# Patient Record
Sex: Male | Born: 1965 | Race: White | Hispanic: No | Marital: Married | Smoking: Never smoker
Health system: Southern US, Community
[De-identification: ages and names within clinical notes are randomized; demographics above are authoritative.]

## PROBLEM LIST (undated history)

## (undated) DIAGNOSIS — I82409 Acute embolism and thrombosis of unspecified deep veins of unspecified lower extremity: Secondary | ICD-10-CM

## (undated) DIAGNOSIS — E785 Hyperlipidemia, unspecified: Secondary | ICD-10-CM

## (undated) DIAGNOSIS — K449 Diaphragmatic hernia without obstruction or gangrene: Secondary | ICD-10-CM

## (undated) DIAGNOSIS — Z21 Asymptomatic human immunodeficiency virus [HIV] infection status: Secondary | ICD-10-CM

## (undated) DIAGNOSIS — K219 Gastro-esophageal reflux disease without esophagitis: Secondary | ICD-10-CM

## (undated) DIAGNOSIS — N200 Calculus of kidney: Secondary | ICD-10-CM

## (undated) HISTORY — DX: Diaphragmatic hernia without obstruction or gangrene: K44.9

## (undated) HISTORY — DX: Asymptomatic human immunodeficiency virus (hiv) infection status: Z21

## (undated) HISTORY — DX: Gilbert syndrome: E80.4

## (undated) HISTORY — PX: UPPER GASTROINTESTINAL ENDOSCOPY: SHX188

## (undated) HISTORY — PX: APPENDECTOMY: SHX54

## (undated) HISTORY — DX: Hyperlipidemia, unspecified: E78.5

## (undated) HISTORY — DX: Calculus of kidney: N20.0

## (undated) HISTORY — PX: COLONOSCOPY: SHX174

## (undated) HISTORY — PX: ARTHROPLASTY: SHX135

## (undated) HISTORY — DX: Gastro-esophageal reflux disease without esophagitis: K21.9

## (undated) HISTORY — DX: Acute embolism and thrombosis of unspecified deep veins of unspecified lower extremity: I82.409

---

## 1990-02-27 DIAGNOSIS — Z21 Asymptomatic human immunodeficiency virus [HIV] infection status: Secondary | ICD-10-CM

## 1990-02-27 HISTORY — DX: Asymptomatic human immunodeficiency virus (hiv) infection status: Z21

## 1998-10-30 ENCOUNTER — Emergency Department (HOSPITAL_COMMUNITY): Admission: EM | Admit: 1998-10-30 | Discharge: 1998-10-30 | Payer: Self-pay

## 1998-10-31 ENCOUNTER — Emergency Department (HOSPITAL_COMMUNITY): Admission: EM | Admit: 1998-10-31 | Discharge: 1998-10-31 | Payer: Self-pay

## 2000-05-31 ENCOUNTER — Encounter: Payer: Self-pay | Admitting: Family Medicine

## 2000-05-31 ENCOUNTER — Encounter: Admission: RE | Admit: 2000-05-31 | Discharge: 2000-05-31 | Payer: Self-pay | Admitting: Family Medicine

## 2000-08-22 ENCOUNTER — Encounter (INDEPENDENT_AMBULATORY_CARE_PROVIDER_SITE_OTHER): Payer: Self-pay | Admitting: Specialist

## 2000-08-22 ENCOUNTER — Ambulatory Visit (HOSPITAL_COMMUNITY): Admission: RE | Admit: 2000-08-22 | Discharge: 2000-08-22 | Payer: Self-pay | Admitting: Gastroenterology

## 2002-10-24 ENCOUNTER — Emergency Department (HOSPITAL_COMMUNITY): Admission: EM | Admit: 2002-10-24 | Discharge: 2002-10-24 | Payer: Self-pay | Admitting: Emergency Medicine

## 2002-10-24 ENCOUNTER — Encounter: Payer: Self-pay | Admitting: Emergency Medicine

## 2003-11-12 ENCOUNTER — Encounter: Admission: RE | Admit: 2003-11-12 | Discharge: 2003-11-12 | Payer: Self-pay | Admitting: Family Medicine

## 2004-10-04 ENCOUNTER — Emergency Department (HOSPITAL_COMMUNITY): Admission: EM | Admit: 2004-10-04 | Discharge: 2004-10-04 | Payer: Self-pay | Admitting: Family Medicine

## 2005-02-19 ENCOUNTER — Emergency Department (HOSPITAL_COMMUNITY): Admission: EM | Admit: 2005-02-19 | Discharge: 2005-02-19 | Payer: Self-pay | Admitting: Family Medicine

## 2005-08-18 ENCOUNTER — Ambulatory Visit: Payer: Self-pay | Admitting: Family Medicine

## 2005-11-23 ENCOUNTER — Ambulatory Visit: Payer: Self-pay | Admitting: Family Medicine

## 2006-06-01 ENCOUNTER — Ambulatory Visit: Payer: Self-pay | Admitting: Family Medicine

## 2006-12-25 ENCOUNTER — Ambulatory Visit: Payer: Self-pay | Admitting: Family Medicine

## 2007-01-02 ENCOUNTER — Ambulatory Visit: Payer: Self-pay | Admitting: Family Medicine

## 2007-01-23 ENCOUNTER — Emergency Department (HOSPITAL_COMMUNITY): Admission: EM | Admit: 2007-01-23 | Discharge: 2007-01-23 | Payer: Self-pay | Admitting: Family Medicine

## 2007-09-05 ENCOUNTER — Ambulatory Visit: Payer: Self-pay | Admitting: Family Medicine

## 2008-06-29 ENCOUNTER — Ambulatory Visit: Payer: Self-pay | Admitting: Family Medicine

## 2008-10-04 ENCOUNTER — Emergency Department (HOSPITAL_COMMUNITY): Admission: EM | Admit: 2008-10-04 | Discharge: 2008-10-04 | Payer: Self-pay | Admitting: Emergency Medicine

## 2009-02-22 ENCOUNTER — Emergency Department (HOSPITAL_COMMUNITY): Admission: EM | Admit: 2009-02-22 | Discharge: 2009-02-22 | Payer: Self-pay | Admitting: Emergency Medicine

## 2009-04-19 ENCOUNTER — Ambulatory Visit: Payer: Self-pay | Admitting: Family Medicine

## 2009-05-17 ENCOUNTER — Encounter: Admission: RE | Admit: 2009-05-17 | Discharge: 2009-05-17 | Payer: Self-pay | Admitting: Family Medicine

## 2009-05-17 ENCOUNTER — Ambulatory Visit: Payer: Self-pay | Admitting: Family Medicine

## 2009-05-19 ENCOUNTER — Ambulatory Visit: Payer: Self-pay | Admitting: Sports Medicine

## 2009-05-19 DIAGNOSIS — M79609 Pain in unspecified limb: Secondary | ICD-10-CM | POA: Insufficient documentation

## 2009-05-27 ENCOUNTER — Ambulatory Visit (HOSPITAL_COMMUNITY): Admission: RE | Admit: 2009-05-27 | Discharge: 2009-05-27 | Payer: Self-pay | Admitting: Family Medicine

## 2009-06-09 ENCOUNTER — Ambulatory Visit: Payer: Self-pay | Admitting: Sports Medicine

## 2009-06-09 DIAGNOSIS — M84369A Stress fracture, unspecified tibia and fibula, initial encounter for fracture: Secondary | ICD-10-CM | POA: Insufficient documentation

## 2009-07-06 ENCOUNTER — Ambulatory Visit: Payer: Self-pay | Admitting: Sports Medicine

## 2009-07-27 ENCOUNTER — Ambulatory Visit: Payer: Self-pay | Admitting: Sports Medicine

## 2009-08-24 ENCOUNTER — Ambulatory Visit: Payer: Self-pay | Admitting: Sports Medicine

## 2010-03-20 ENCOUNTER — Encounter: Payer: Self-pay | Admitting: Family Medicine

## 2010-03-22 ENCOUNTER — Ambulatory Visit
Admission: RE | Admit: 2010-03-22 | Discharge: 2010-03-22 | Payer: Self-pay | Source: Home / Self Care | Attending: Family Medicine | Admitting: Family Medicine

## 2010-03-28 ENCOUNTER — Ambulatory Visit (HOSPITAL_COMMUNITY)
Admission: RE | Admit: 2010-03-28 | Discharge: 2010-03-28 | Payer: Self-pay | Source: Home / Self Care | Attending: Family Medicine | Admitting: Family Medicine

## 2010-03-31 NOTE — Assessment & Plan Note (Signed)
Summary: FU LEG PAIN/MJD   Vital Signs:  Patient profile:   45 year old male BP sitting:   135 / 87  Vitals Entered By: Lillia Pauls CMA (Jul 06, 2009 3:07 PM)  History of Present Illness: 45 yo M here for 4 week f/u of R tibial stress fracture  Patient reports only feeling a bruise-like sensation at area of tibia after a long walk No swelling and no true pain Has been able to walk 3 miles without limping. Is compliant with wearing his aircast on ambulation. Is following tibial stress fracture protocol Biking up to 45 minutes at a time Not needing any medications Has not tried jogging yet Ices after exercise.  Allergies (verified): No Known Drug Allergies  Physical Exam  General:  Well-developed,well-nourished,in no acute distress; alert,appropriate and cooperative throughout examination. Msk:  R lower leg: No gross deformity, swelling, or bruising. No TTP throughout tibia. Negative load, fulcrum, and hop tests. FROM ankle and knee. NV intact distally.   Impression & Recommendations:  Problem # 1:  STRESS FRACTURE, TIBIA (ICD-733.93) Assessment Improved Given patient has negative hop test and no pain on exam, will start him in jogging part of protocol slowly over next 3 weeks.  To back down a step if pain worsens with this.  Continue with icing and biking on off days.  F/u in 3 weeks for reevaluation.  Problem # 2:  LEG PAIN, RIGHT (ICD-729.5) Assessment: Improved 2/2 stress fracture.  Follow protocol as noted.  Patient Instructions: 1)  Week 1: Every other day jog/walk 400 meters/400 meters (1/4 of a mile each) on soft track if possible otherwise ok on a treadmill x 3 sessions. 2)  Week 2: Next 3 sessions jog/walk 500 meters/339meters, 600 meters/200 meters, and finally 700 meters/100 meters. 3)  Week 3: Then jog 1 mile every other day for the third 4)  Bike 45 minutes on alternate days 5)  Continue with icing 6)  Continue with weight exercises. 7)  Make sure  you continue to wear the aircast when you are up walking around and definitely with exercise. 8)  Follow up with [place] in 3 weeks.

## 2010-03-31 NOTE — Assessment & Plan Note (Signed)
Summary: F/U,MC   History of Present Illness: 45 yo M here for 4 week f/u of R tibial stress fracture  He initially had symptoms dating back to early march - pain anterior right tibia at mid-distal 1/3rd. Had been following protocol and progressed to jogging about 3 miles every other day with aircast Approximately 2 weeks ago during a run developed a bruise sensation in same area he had pain No swelling or bruising Since then has completely stopped running - biking and walkign without pain Able to walk up to 3 miles a day with dog and bike 13 miles Occasionally icing. This was first year he has tried to run and with the above happening he is going to stop running for the year and go back to his weight training, biking, walking only. No pain at rest.  Allergies (verified): No Known Drug Allergies  Physical Exam  General:  Well-developed,well-nourished,in no acute distress; alert,appropriate and cooperative throughout examination. Msk:  R lower leg: No gross deformity, swelling, or bruising. No TTP throughout tibia. Negative load, fulcrum, and hop tests. FROM ankle and knee. NV intact distally.   Impression & Recommendations:  Problem # 1:  STRESS FRACTURE, TIBIA (ICD-733.93) Assessment Deteriorated Patient will continue with biking and walking the dog, weight training.  Discussed coming out of the aircast but only doing his biking and walking at 50% his normal workouts to ensure he does not get pain with this before increasing by 0.5 miles per session.  Given he is about 3 months out and these put less stress on tibia than running, he should do well with this.  F/u with [place] in 8 weeks or as needed if he is not having issues.  Call for any concerns.

## 2010-03-31 NOTE — Assessment & Plan Note (Signed)
Summary: f/u,mc   Vital Signs:  Patient profile:   45 year old male BP sitting:   143 / 81  Vitals Entered By: Lillia Pauls CMA (June 09, 2009 1:39 PM)  History of Present Illness: Reports to f/u right tib stress fracture. Using air cast for ambulatory purposes.  No running of high impact LE activity since his LOV. No paresthesias. Tripped over his dog yesterday, causing him to land on both feet. Felt some mild med right tib pain which quickly resolved. No swelling.  Allergies: No Known Drug Allergies  Physical Exam  General:  Well-developed,well-nourished,in no acute distress; alert,appropriate and cooperative throughout examination Msk:  Unchanged from last examination with exception of significantly decreased right tib ttp. Normal gait.   Impression & Recommendations:  Problem # 1:  STRESS FRACTURE, TIBIA (ICD-733.93) Assessment Improved  - Will start tibial stress fracture protocol, beginning with staionary cycling. Respective handout provided to the patient. Cautioned to use pain as his guide. - Continue to use aircast for ambulatory activities. - Patient to RTC in 4 wks. Call us sooner as needed for any questions, pain, or other concerns.

## 2010-03-31 NOTE — Assessment & Plan Note (Signed)
Summary: F/U,MC   Vital Signs:  Patient profile:   45 year old male BP sitting:   125 / 73  Vitals Entered By: Lillia Pauls CMA (Jul 27, 2009 3:06 PM)  History of Present Illness: 45 yo M here for 3 week f/u of R tibial stress fracture  Patient no longer has any pain. No swelling or bruising. Up to jog/walk 2+ miles without increase in pain - jogs about 75% of the time. Is compliant with wearing his aircast on ambulation. Is following tibial stress fracture protocol provided first OV. Biking up to 45 minutes at a time Not needing any medications Ices after exercise.  Allergies (verified): No Known Drug Allergies  Physical Exam  General:  Well-developed,well-nourished,in no acute distress; alert,appropriate and cooperative throughout examination. Msk:  R lower leg: No gross deformity, swelling, or bruising. No TTP throughout tibia. Negative load, fulcrum, and hop tests. FROM ankle and knee. NV intact distally.   Impression & Recommendations:  Problem # 1:  STRESS FRACTURE, TIBIA (ICD-733.93) Assessment Improved Continue with stress fracture protocol.  See instructions.  F/u in 4 weeks.  Problem # 2:  LEG PAIN, RIGHT (ICD-729.5) Assessment: Improved  Patient Instructions: 1)  Continuous jogging with aircast 2-2.5 miles every other day next week. 2)  Then continue with protocol - alternate with and without aircast for jogs. 3)  Biking on off days 4)  Ok to go without aircast around house and on level terrain unless sore. 5)  Icing as needed. 6)  Follow up in 4 weeks.

## 2010-03-31 NOTE — Assessment & Plan Note (Signed)
Summary: 3:15- R TIBIA PAIN PER [facility],MC   Vital Signs:  Patient profile:   45 year old male Height:      69 inches Weight:      180 pounds BMI:     26.68 BP sitting:   100 / 70  Vitals Entered By: Lillia Pauls CMA (May 19, 2009 3:17 PM)  History of Present Illness: Started marathon training in 02/2009. Started running 13 miles weekly during this time. Developed bilatera mid shin pain  ~2.5 wks ago after a 5 mile run. Noted bilateral shin pain during 8 mile mark of next run -- this notably localized to the right shin. Did not complete this run. No running in interim. Reproduced pain on ambulation.  No prior stress fracture or procedures. No paresthesias.  Marathon scheduled for 08/2009.  Allergies (verified): No Known Drug Allergies PMH-FH-SH reviewed for relevance  Physical Exam  General:  Well-developed,well-nourished,in no acute distress; alert,appropriate and cooperative throughout examination. Msk:  TIBIAS/FIBULAS: Mid shaft ttp extending toward malleolus on right. Some posterior tib ttp on right as well.  No swelling, discoloration, or defect. (+) right hop test.  HIPS: FROM. Decreased abd/ER strength on right.  KNEES/ANKLES/FEET: Full ROM/strength. Bilateral pes planus with excessive pronation. No ttp.   Additional Exam:  Musculoskeletal Ultrasound: Longitudinal and transverse views of the right tibia revealed the following:  Hypoechogenic area above area of max ttp suggestive of fluid collection. No apparent cortical thickening. Borderline increased doppler flow wrt to area of ttp.   Impression & Recommendations:  Problem # 1:  LEG PAIN, RIGHT (ICD-729.5) Likely stress fracture  - No running or lower extremity exercise. - Bone scan to r/o stress fracture. - Long Air cast for RLE. - RTC in 2 weeks.  Orders: [place] LIMITED (13086) Aircast Leg brace (V7846) T-Bone scan 3 phase study (96295)

## 2010-04-05 ENCOUNTER — Ambulatory Visit (INDEPENDENT_AMBULATORY_CARE_PROVIDER_SITE_OTHER): Payer: Commercial Managed Care - PPO

## 2010-04-05 DIAGNOSIS — Z79899 Other long term (current) drug therapy: Secondary | ICD-10-CM

## 2010-05-17 ENCOUNTER — Ambulatory Visit (INDEPENDENT_AMBULATORY_CARE_PROVIDER_SITE_OTHER): Payer: 59 | Admitting: Family Medicine

## 2010-05-17 DIAGNOSIS — Z79899 Other long term (current) drug therapy: Secondary | ICD-10-CM

## 2010-05-17 DIAGNOSIS — B20 Human immunodeficiency virus [HIV] disease: Secondary | ICD-10-CM

## 2010-06-05 LAB — POCT URINALYSIS DIP (DEVICE)
Bilirubin Urine: NEGATIVE
Glucose, UA: NEGATIVE mg/dL
Hgb urine dipstick: NEGATIVE
Nitrite: NEGATIVE
Urobilinogen, UA: 0.2 mg/dL (ref 0.0–1.0)

## 2010-07-11 ENCOUNTER — Other Ambulatory Visit: Payer: Self-pay | Admitting: Family Medicine

## 2010-07-15 NOTE — Procedures (Signed)
[facility]  Patient:    Darryl Black, Darryl Black                      MRN: [id number] Proc. Date: 08/22/00 Attending:  Verlin Grills, M.D. CC:         Ronnald Nian, M.D.  Fransisco Hertz, M.D.   Procedure Report  PROCEDURE PERFORMED:  Esophagogastroduodenoscopy, small bowel biopsies, colonoscopy, ileal biopsies and colonic biopsies.  DATE OF BIRTH:  04-Jun-1965  REFERRING PHYSICIAN:  Ronnald Nian, M.D.  ENDOSCOPIST:  Verlin Grills, M.D.  INDICATIONS FOR PROCEDURE:  The patient is a 45 year old male who is HIV positive.  He presents with chronic alternating nonbloody diarrhea with constipation and progressive weight loss despite an adequate oral intake by his estimation.  His odynophagia has resolved.  His last CD4 count was 80.  A complete evaluation of his stool ordered by Dr. Sharlot Gowda was negative for bacterial or parasitic infection.  MEDICATION ALLERGIES:  None.  CHRONIC MEDICATIONS:  Zerit, Videx, Susteva, Septra, multivitamin, Viramune, Vactivan, Levaquin, Humibid.  PAST MEDICAL HISTORY:  HIV positive, lipodystrophy, appendectomy.  PREMEDICATION:  Versed 10 mg, Demerol 50 mg.  ENDOSCOPE:  Olympus video gastroscope and pediatric colonoscope.  DESCRIPTION OF PROCEDURE:   Esophagogastroduodenoscopy with small bowel biopsies.  After obtaining informed consent, the patient was placed in the left lateral decubitus position.  I administered intravenous Demerol and intravenous Versed to achieve conscious sedation for the procedure.  The patients blood pressure, oxygen saturation and cardiac rhythm were monitored throughout the procedure and documented in the medical record.  The Olympus gastroscope was passed through the posterior hypopharynx into the proximal esophagus without difficulty.  The hypopharynx, larynx and vocal cords appeared normal.  Esophagoscopy:  The proximal, mid and lower segments of the esophagus  appeared normal.  Gastroscopy:  Retroflex view of the gastric cardia and fundus was normal.  The gastric body, antrum and pylorus appeared normal.  Duodenoscopy:  The duodenal bulb, midduodenum and distal duodenum appeared normal.  Six biopsies were taken from the second/third portions of the duodenum.  ASSESSMENT:  Normal esophagogastroduodenoscopy.  Small bowel biopsies have been sent to pathology to look for Blastocystis, Isospora, Cryptosporidia, and Microsporidia.  DESCRIPTION OF PROCEDURE:  Proctocolonoscopy to the cecum.  Anal inspection was normal.  Digital rectal exam was normal.  The Olympus pediatric video colonoscope was then introduced into the rectum and easily advanced to the cecum.  A normal-appearing ileocecal valve was intubated and the distal ileum inspected.  Colonic preparation for the exam today was excellent.  Rectum:  Normal.  Sigmoid colon and descending colon:  Normal.  Splenic flexure:  Normal.  Transverse colon:  Normal.  Hepatic flexure:  Normal.  Ascending colon:  Normal.  Cecum and ileocecal valve:  Normal.  Distal ileum:  Normal.  Five biopsies were taken from the distal ileum.  Three biopsies were taken from the right colon and three biopsies were taken from the descending colon; all colonic biopsies were submitted in one bottle for pathologic evaluation.  Large bowel pathogens would include Shigella, Campylobacter, amoeba, Cytomegalovirus and Herpes simplex virus.  ASSESSMENT:  Normal proctocolonoscopy to the cecum with intubation of the ileocecal valve and distal ileal inspection.  Ileal biopsies and colonic biopsies are pending. DD:  08/22/00 TD:  08/22/00 Job: [id number]

## 2011-04-11 ENCOUNTER — Ambulatory Visit (INDEPENDENT_AMBULATORY_CARE_PROVIDER_SITE_OTHER): Payer: 59 | Admitting: Family Medicine

## 2011-04-11 ENCOUNTER — Encounter: Payer: Self-pay | Admitting: Family Medicine

## 2011-04-11 VITALS — BP 120/82 | HR 52 | Ht 68.0 in | Wt 185.0 lb

## 2011-04-11 DIAGNOSIS — B20 Human immunodeficiency virus [HIV] disease: Secondary | ICD-10-CM

## 2011-04-11 DIAGNOSIS — E781 Pure hyperglyceridemia: Secondary | ICD-10-CM

## 2011-04-11 DIAGNOSIS — E785 Hyperlipidemia, unspecified: Secondary | ICD-10-CM | POA: Insufficient documentation

## 2011-04-11 DIAGNOSIS — Z0000 Encounter for general adult medical examination without abnormal findings: Secondary | ICD-10-CM

## 2011-04-11 LAB — POC HEMOCCULT BLD/STL (OFFICE/1-CARD/DIAGNOSTIC): Fecal Occult Blood, POC: NEGATIVE

## 2011-04-11 LAB — COMPREHENSIVE METABOLIC PANEL
Albumin: 5.2 g/dL (ref 3.5–5.2)
Alkaline Phosphatase: 51 U/L (ref 39–117)
BUN: 18 mg/dL (ref 6–23)
CO2: 27 mEq/L (ref 19–32)
Calcium: 10.7 mg/dL — ABNORMAL HIGH (ref 8.4–10.5)
Chloride: 100 mEq/L (ref 96–112)
Glucose, Bld: 87 mg/dL (ref 70–99)
Potassium: 4 mEq/L (ref 3.5–5.3)
Sodium: 137 mEq/L (ref 135–145)
Total Protein: 8.2 g/dL (ref 6.0–8.3)

## 2011-04-11 LAB — LIPID PANEL
Cholesterol: 219 mg/dL — ABNORMAL HIGH (ref 0–200)
HDL: 42 mg/dL (ref >39)
LDL Cholesterol: 128 mg/dL — ABNORMAL HIGH (ref 0–99)
Triglycerides: 246 mg/dL — ABNORMAL HIGH (ref <150)

## 2011-04-11 MED ORDER — OMEGA-3-ACID ETHYL ESTERS 1 G PO CAPS: 1.0000 g | ORAL_CAPSULE | Freq: Two times a day (BID) | ORAL | Status: DC

## 2011-04-11 MED ORDER — RITONAVIR 100 MG PO CAPS: 100.0000 mg | ORAL_CAPSULE | ORAL | Status: DC

## 2011-04-11 MED ORDER — ATAZANAVIR SULFATE 300 MG PO CAPS: 300.0000 mg | ORAL_CAPSULE | Freq: Every day | ORAL | Status: DC

## 2011-04-11 MED ORDER — EMTRICITABINE-TENOFOVIR DF 200-300 MG PO TABS: 1.0000 | ORAL_TABLET | Freq: Every day | ORAL | Status: AC

## 2011-04-11 MED ORDER — FENOFIBRATE 160 MG PO TABS: 160.0000 mg | ORAL_TABLET | Freq: Every day | ORAL | Status: DC

## 2011-04-11 NOTE — Progress Notes (Signed)
  Subjective:    Patient ID: Darryl Black, male    DOB: 18-Feb-1966, 46 y.o.   MRN: [id number]  HPI He is here for complete examination. He has enjoyed excellent health. He continues on medications listed in the chart. His work is going well. His home life is stable. He does not smoke or drink. He does complain of decreased urinary stream but no hesitancy, dysuria, frequency. He has had no recent sexual activity.   Review of Systems Negative except as above    Objective:   Physical ExamBP 120/82  Pulse 52  Ht 5\' 8"  (1.727 m)  Wt 185 lb (83.915 kg)  BMI 28.13 kg/m2  General Appearance:    Alert, cooperative, no distress, appears stated age  Head:    Normocephalic, without obvious abnormality, atraumatic  Eyes:    PERRL, conjunctiva/corneas clear, EOM's intact, fundi    benign  Ears:    Normal TM's and external ear canals  Nose:   Nares normal, mucosa normal, no drainage or sinus   tenderness  Throat:   Lips, mucosa, and tongue normal; teeth and gums normal  Neck:   Supple, no lymphadenopathy;  thyroid:  no   enlargement/tenderness/nodules; no carotid   bruit or JVD  Back:    Spine nontender, no curvature, ROM normal, no CVA     tenderness  Lungs:     Clear to auscultation bilaterally without wheezes, rales or     ronchi; respirations unlabored  Chest Wall:    No tenderness or deformity   Heart:    Regular rate and rhythm, S1 and S2 normal, no murmur, rub   or gallop  Breast Exam:    No chest wall tenderness, masses or gynecomastia  Abdomen:     Soft, non-tender, nondistended, normoactive bowel sounds,    no masses, no hepatosplenomegaly  Genitalia:    Normal male external genitalia without lesions.  Testicles without masses.  No inguinal hernias.  Rectal:    Normal sphincter tone, no masses or tenderness; guaiac negative stool.  Prostate smooth, no nodules, slightly enlarged.  Extremities:   No clubbing, cyanosis or edema  Pulses:   2+ and symmetric all extremities  Skin:   Skin  color, texture, turgor normal, no rashes or lesions  Lymph nodes:   Cervical, supraclavicular, and axillary nodes normal  Neurologic:   CNII-XII intact, normal strength, sensation and gait; reflexes 2+ and symmetric throughout          Psych:   Normal mood, affect, hygiene and grooming.          Assessment & Plan:   1. Routine general medical examination at a health care facility  POCT Hemoccult (POC) Blood/Stool Test, Comprehensive metabolic panel, Lipid panel  2. HIV disease  T-helper cells (CD4) count, HIV 1 RNA quant-no reflex-bld  3. Hypertriglyceridemia     discussed his decreased stream. At this time no further intervention since that C. only symptom he is having. His medications were renewed.

## 2011-04-12 LAB — T-HELPER CELLS (CD4) COUNT (NOT AT ARMC)
Absolute CD4: 1150 /uL (ref 381–1469)
Total Lymphocyte: 53 % — ABNORMAL HIGH (ref 12–46)
WBC, lymph enumeration: 7 10*3/uL (ref 4.0–10.5)

## 2011-04-18 ENCOUNTER — Encounter: Payer: Self-pay | Admitting: Internal Medicine

## 2011-04-24 ENCOUNTER — Telehealth: Payer: Self-pay | Admitting: Internal Medicine

## 2011-04-24 MED ORDER — ABACAVIR SULFATE-LAMIVUDINE 600-300 MG PO TABS: 1.0000 | ORAL_TABLET | Freq: Every day | ORAL | Status: DC

## 2011-04-24 NOTE — Telephone Encounter (Signed)
Epzicom called in

## 2011-04-25 ENCOUNTER — Other Ambulatory Visit: Payer: 59

## 2011-04-25 DIAGNOSIS — N289 Disorder of kidney and ureter, unspecified: Secondary | ICD-10-CM

## 2011-04-25 LAB — BASIC METABOLIC PANEL
CO2: 25 mEq/L (ref 19–32)
Calcium: 10.3 mg/dL (ref 8.4–10.5)
Chloride: 106 mEq/L (ref 96–112)
Creat: 1.67 mg/dL — ABNORMAL HIGH (ref 0.50–1.35)
Glucose, Bld: 105 mg/dL — ABNORMAL HIGH (ref 70–99)

## 2011-06-29 ENCOUNTER — Encounter: Payer: Self-pay | Admitting: Nephrology

## 2011-07-14 ENCOUNTER — Other Ambulatory Visit: Payer: Self-pay | Admitting: Family Medicine

## 2011-07-14 NOTE — Telephone Encounter (Signed)
meds renewed

## 2011-07-14 NOTE — Telephone Encounter (Signed)
Is this ok?

## 2011-07-25 ENCOUNTER — Other Ambulatory Visit: Payer: Self-pay | Admitting: Family Medicine

## 2011-10-13 ENCOUNTER — Other Ambulatory Visit: Payer: Self-pay | Admitting: Family Medicine

## 2012-01-29 ENCOUNTER — Other Ambulatory Visit: Payer: Self-pay | Admitting: Family Medicine

## 2012-02-02 ENCOUNTER — Other Ambulatory Visit: Payer: Self-pay | Admitting: Family Medicine

## 2012-02-02 MED ORDER — RITONAVIR 100 MG PO CAPS: 100.0000 mg | ORAL_CAPSULE | ORAL | Status: DC

## 2012-04-08 ENCOUNTER — Other Ambulatory Visit: Payer: Self-pay

## 2012-04-08 ENCOUNTER — Emergency Department (HOSPITAL_COMMUNITY)
Admission: EM | Admit: 2012-04-08 | Discharge: 2012-04-08 | Disposition: A | Discharge status: Home / Self Care | Payer: 59 | Attending: Emergency Medicine | Admitting: Emergency Medicine

## 2012-04-08 ENCOUNTER — Encounter (HOSPITAL_COMMUNITY): Payer: Self-pay

## 2012-04-08 ENCOUNTER — Emergency Department (HOSPITAL_COMMUNITY)
Admission: EM | Admit: 2012-04-08 | Discharge: 2012-04-08 | Disposition: A | Discharge status: Other Acute Inpatient Hospital | Payer: 59 | Source: Home / Self Care | Attending: Emergency Medicine | Admitting: Emergency Medicine

## 2012-04-08 ENCOUNTER — Emergency Department (HOSPITAL_COMMUNITY): Payer: 59

## 2012-04-08 DIAGNOSIS — I209 Angina pectoris, unspecified: Secondary | ICD-10-CM

## 2012-04-08 DIAGNOSIS — Z87442 Personal history of urinary calculi: Secondary | ICD-10-CM | POA: Insufficient documentation

## 2012-04-08 DIAGNOSIS — Z862 Personal history of diseases of the blood and blood-forming organs and certain disorders involving the immune mechanism: Secondary | ICD-10-CM | POA: Insufficient documentation

## 2012-04-08 DIAGNOSIS — R0789 Other chest pain: Secondary | ICD-10-CM | POA: Insufficient documentation

## 2012-04-08 DIAGNOSIS — Z8639 Personal history of other endocrine, nutritional and metabolic disease: Secondary | ICD-10-CM | POA: Insufficient documentation

## 2012-04-08 DIAGNOSIS — Z21 Asymptomatic human immunodeficiency virus [HIV] infection status: Secondary | ICD-10-CM | POA: Insufficient documentation

## 2012-04-08 DIAGNOSIS — E785 Hyperlipidemia, unspecified: Secondary | ICD-10-CM | POA: Insufficient documentation

## 2012-04-08 DIAGNOSIS — Z79899 Other long term (current) drug therapy: Secondary | ICD-10-CM | POA: Insufficient documentation

## 2012-04-08 DIAGNOSIS — Z8719 Personal history of other diseases of the digestive system: Secondary | ICD-10-CM | POA: Insufficient documentation

## 2012-04-08 LAB — CBC
Hemoglobin: 15.2 g/dL (ref 13.0–17.0)
MCH: 35.3 pg — ABNORMAL HIGH (ref 26.0–34.0)
MCHC: 36.6 g/dL — ABNORMAL HIGH (ref 30.0–36.0)
RDW: 12.4 % (ref 11.5–15.5)

## 2012-04-08 LAB — POCT I-STAT TROPONIN I

## 2012-04-08 LAB — BASIC METABOLIC PANEL
BUN: 19 mg/dL (ref 6–23)
Calcium: 9.7 mg/dL (ref 8.4–10.5)
GFR calc Af Amer: 63 mL/min — ABNORMAL LOW (ref >90)
GFR calc non Af Amer: 55 mL/min — ABNORMAL LOW (ref >90)
Glucose, Bld: 112 mg/dL — ABNORMAL HIGH (ref 70–99)
Sodium: 140 mEq/L (ref 135–145)

## 2012-04-08 MED ORDER — ASPIRIN 81 MG PO CHEW
CHEWABLE_TABLET | ORAL | Status: AC
  Filled 2012-04-08: qty 4

## 2012-04-08 MED ORDER — SODIUM CHLORIDE 0.9 % IV SOLN
INTRAVENOUS | Status: DC
  Administered 2012-04-08: 16:00:00 via INTRAVENOUS

## 2012-04-08 MED ORDER — ASPIRIN 81 MG PO CHEW
324.0000 mg | CHEWABLE_TABLET | Freq: Once | ORAL | Status: AC
  Administered 2012-04-08: 324 mg via ORAL

## 2012-04-08 MED ORDER — NITROGLYCERIN 0.4 MG SL SUBL
0.4000 mg | SUBLINGUAL_TABLET | SUBLINGUAL | Status: AC | PRN
  Administered 2012-04-08: 0.4 mg via SUBLINGUAL

## 2012-04-08 MED ORDER — NITROGLYCERIN 0.4 MG SL SUBL
SUBLINGUAL_TABLET | SUBLINGUAL | Status: AC
  Filled 2012-04-08: qty 25

## 2012-04-08 NOTE — ED Notes (Signed)
Pt was transferred from [facility] to [facility] via [facility] for left sided CP for past 6 days. Has been working at [employer] and today was first day off. Has a strong family hx of heart disease. #20 to [facility] and O2 at 2L/ [place]. Was given 324 mg ASA and NTG SL x 1. Denies any pain at present. NSR on the monitor.

## 2012-04-08 NOTE — ED Notes (Signed)
Resident at bedside to speak with pt.

## 2012-04-08 NOTE — ED Provider Notes (Signed)
History     CSN: [id number]  Arrival date & time 04/08/12  1556   First MD Initiated Contact with Patient 04/08/12 1622      Chief Complaint  Patient presents with  . Chest Pain    (Consider location/radiation/quality/duration/timing/severity/associated sxs/prior treatment) HPI Comments: 47 y/o male h/o HIV, hiatal hernia, occasional food bolus p/w chest pain. Intermittent x8-10 days. Usually ~5x daily. Not exertional. No associated diaphoresis or SOB. Mildly worsened with eating/drinking. No fevers. No cough. No h/o DVT/PE. No lower extremity edema or calf pain. Otherwise feeling well.  Patient is a 47 y.o. male presenting with chest pain. The history is provided by the patient.  Chest Pain Pain location:  L chest and substernal area Pain quality: pressure   Pain radiates to:  Does not radiate Pain radiates to the back: no   Pain severity:  Mild Onset quality:  Sudden Duration:  30 minutes Timing:  Intermittent Progression:  Resolved Chronicity:  New Context: not breathing, not lifting, no movement and no stress   Relieved by:  Nothing Worsened by:  Nothing tried Ineffective treatments:  None tried Associated symptoms: no abdominal pain, no back pain, no cough, no dizziness, no fever, no headache, no nausea, no shortness of breath and not vomiting     Past Medical History  Diagnosis Date  . HIV positive 89  . Renal stone   . Dyslipidemia   . HH (hiatus hernia)   . Gilbert's disease     History reviewed. No pertinent past surgical history.  History reviewed. No pertinent family history.  History  Substance Use Topics  . Smoking status: Never Smoker   . Smokeless tobacco: Never Used  . Alcohol Use: No      Review of Systems  Constitutional: Negative for fever and chills.  HENT: Negative for congestion and rhinorrhea.   Eyes: Negative for pain and visual disturbance.  Respiratory: Negative for cough and shortness of breath.   Cardiovascular: Positive for  chest pain. Negative for leg swelling.  Gastrointestinal: Negative for nausea, vomiting, abdominal pain and diarrhea.  Genitourinary: Negative for flank pain and difficulty urinating.  Musculoskeletal: Negative for back pain.  Skin: Negative for color change and rash.  Neurological: Negative for dizziness and headaches.  All other systems reviewed and are negative.    Allergies  Review of patient's allergies indicates no known allergies.  Home Medications   Current Outpatient Rx  Name  Route  Sig  Dispense  Refill  . abacavir-lamiVUDine (EPZICOM) 600-300 MG per tablet   Oral   Take 1 tablet by mouth daily.   90 tablet   3   . atazanavir (REYATAZ) 300 MG capsule   Oral   Take 300 mg by mouth daily.         Marland Kitchen emtricitabine-tenofovir (TRUVADA) 200-300 MG per tablet   Oral   Take 1 tablet by mouth daily.   90 tablet   3   . fenofibrate 160 MG tablet   Oral   Take 160 mg by mouth daily.         . Multiple Vitamin (MULTIVITAMIN WITH MINERALS) TABS   Oral   Take 1 tablet by mouth daily.         Marland Kitchen omega-3 acid ethyl esters (LOVAZA) 1 G capsule   Oral   Take 2 g by mouth 2 (two) times daily.         . ritonavir (NORVIR) 100 MG capsule   Oral   Take 100 mg by  mouth daily.           BP 111/73  Pulse 52  Temp(Src) 98.4 F (36.9 C) (Oral)  Resp 14  Ht 5\' 9"  (1.753 m)  Wt 176 lb (79.833 kg)  BMI 25.98 kg/m2  SpO2 97%  Physical Exam  Nursing note and vitals reviewed. Constitutional: He is oriented to person, place, and time. He appears well-developed and well-nourished. No distress.  HENT:  Head: Normocephalic and atraumatic.  Eyes: Conjunctivae are normal. Right eye exhibits no discharge. Left eye exhibits no discharge.  Neck: No tracheal deviation present.  Cardiovascular: Normal rate, regular rhythm, normal heart sounds and intact distal pulses.   Pulmonary/Chest: Effort normal and breath sounds normal. No stridor. No respiratory distress. He has no  wheezes. He has no rales. He exhibits no tenderness.  Abdominal: Soft. He exhibits no distension. There is no tenderness. There is no guarding.  Musculoskeletal: He exhibits no edema and no tenderness.  Neurological: He is alert and oriented to person, place, and time.  Skin: Skin is warm and dry.  Psychiatric: He has a normal mood and affect. His behavior is normal.    ED Course  Procedures (including critical care time)  Labs Reviewed  CBC - Abnormal; Notable for the following:    MCH 35.3 (*)    MCHC 36.6 (*)    All other components within normal limits  BASIC METABOLIC PANEL - Abnormal; Notable for the following:    Glucose, Bld 112 (*)    Creatinine, Ser 1.47 (*)    GFR calc non Af Amer 55 (*)    GFR calc Af Amer 63 (*)    All other components within normal limits  POCT I-STAT TROPONIN I  POCT I-STAT TROPONIN I   Dg Chest 2 View  04/08/2012  *RADIOLOGY REPORT*  Clinical Data: Chest pain.  CHEST - 2 VIEW  Comparison: None.  Findings: Heart and mediastinal contours are within normal limits. No focal opacities or effusions.  No acute bony abnormality.  IMPRESSION: No active cardiopulmonary disease.   Original Report Authenticated By: Charlett Nose, M.D.      1. Atypical chest pain      Date: 04/08/2012  Rate: 57  Rhythm: sinus bradycardia  QRS Axis: normal  Intervals: normal. Borderline PR prolongation concerning for 1st degree AV block  ST/T Wave abnormalities: normal  Conduction Disutrbances:none  Narrative Interpretation:   Old EKG Reviewed: unchanged   Date: 04/08/2012  Rate: sinus bradycardia with sinus arrhythmia  Rhythm: sinus bradycardia  QRS Axis: normal  Intervals: PR prolonged  ST/T Wave abnormalities: normal  Conduction Disutrbances:none  Narrative Interpretation:   Old EKG Reviewed: unchanged    MDM    47 y/o male p/w chest pain. Intermittent. Non-exertional. Received ASA and NTG today. Did not notice change in symptoms with meds. Otherwise  feeling well.  Is active at baseline. Runs regularly without pain or discomfort. Just at gym lifting weights without pain. No pain since being at urgent care. CP free currently.  H/o hiatal hernia and intermittent food bolus. States he will occasionally go to bathroom and make him vomit. Last endoscopy about 2 years ago when diagnosed with hiatal hernia. Last food bolus 2 days ago. States it happens "all the time". "always passes though"  Uncertain source of pain. However, possibly 2/2 GI pathology. Tolerating po well. No emergent GI pathology based on history or physical. Further w/u outpt. Unlikely PNA as CXR negative, no leukocytosis, no cough, no fever Unlikely ACS as troponin  negative x2 , EKG wnl x2, low risk per TIMI  Unlikely PE as atypical presentation, low risk per PERC/[facility]s, Doubt Aortic Dissection, Pancreatitis, Arrhythmia, Pneumothorax, Endo/Myo/Pericarditis, Shingles, Emergent complications of an Ulcer, Esophageal pathology, or other emergent pathology.  Patient discharged home. Return precautions given. To follow up with pcp and cards. patient in agreement with plan.  Labs and imaging reviewed by myself and considered in medical decision making if ordered. Imaging interpreted by radiology.   Discussed case with Dr. Hyacinth Meeker who is in agreement with assessment and plan.           Stevie Kern, MD 04/08/12 2157

## 2012-04-08 NOTE — ED Notes (Signed)
Rx x 0.  Pt voiced understanding to f/u with [facility] and PCP and return for worsening condition.

## 2012-04-08 NOTE — ED Notes (Signed)
Reported left sided chest discomfort for 1 week; both grandfathers died before 53 w MI (father still living, mother deceased from [place]) pain left chest , no change w palpation, inspiration, ROM; NAD at present, w/d/color good

## 2012-04-08 NOTE — ED Provider Notes (Signed)
Chief Complaint  Patient presents with  . Chest Pain    History of Present Illness:   Darryl Black is a 47 year old HIV-positive male who has had a one-week history of intermittent left parasternal chest pain. The pain comes and goes, lasting about 30 minutes at a time. It feels like a cold sensation rated 2/10 in intensity. It's worse PE 2 drinks and unrelated to exertion, position, or activity. He does have a history of hiatal hernia and has had some dysphagia. He denies any associated shortness of breath, nausea, diaphoresis, or history of heart disease. He does have a history of hyperlipidemia, elevated creatinine, and is HIV positive. His last CD4 count was over 700 and his last viral titer was undetectable. He is followed by Dr. Sharlot Gowda. He denies any fever, chills, sweats, coughing, wheezing, shortness of breath, palpitations, dizziness, syncope, leg pain or swelling, abdominal pain, nausea, vomiting, indigestion, or heartburn.  Review of Systems:  Other than noted above, the patient denies any of the following symptoms. Systemic:  No fever, chills, sweats, or fatigue. ENT:  No nasal congestion, rhinorrhea, or sore throat. Pulmonary:  No cough, wheezing, shortness of breath, sputum production, hemoptysis. Cardiac:  No palpitations, rapid heartbeat, dizziness, presyncope or syncope. GI:  No abdominal pain, heartburn, nausea, or vomiting. Ext:  No leg pain or swelling.  PMFSH:  Past medical history, family history, social history, meds, and allergies were reviewed and updated as needed.   Physical Exam:   Vital signs:  BP 127/81  Pulse 60  Temp(Src) 98.3 F (36.8 C) (Oral)  SpO2 98% Gen:  Alert, oriented, in no distress, skin warm and dry. Eye:  PERRL, lids and conjunctivas normal.  Sclera non-icteric. ENT:  Mucous membranes moist, pharynx clear. Neck:  Supple, no adenopathy or tenderness.  No JVD. Lungs:  Clear to auscultation, no wheezes, rales or rhonchi.  No respiratory  distress. Heart:  Regular rhythm.  No gallops, murmers, clicks or rubs. Chest:  No chest wall tenderness. Abdomen:  Soft, nontender, no organomegaly or mass.  Bowel sounds normal.  No pulsatile abdominal mass or bruit. Ext:  No edema.  No calf tenderness and Homann's sign negative.  Pulses full and equal. Skin:  Warm and dry.  No rash.  EKG:   Date: 04/08/2012  Rate: 62  Rhythm: normal sinus rhythm  QRS Axis: normal  Intervals: normal  ST/T Wave abnormalities: normal  Conduction Disutrbances:none  Narrative Interpretation: Normal sinus rhythm with sinus arrhythmia, normal EKG.  Old EKG Reviewed: none available   Medications given in [facility]:  IV was started with normal saline at 50 mL per hour, he was given aspirin 325 mg by mouth and nitroglycerin 0.4 mg sublingually. He was placed on a monitor and given oxygen and will be transported to the emergency department via [facility].  Assessment:  The encounter diagnosis was Angina pectoris.   Plan:   1.  The following meds were prescribed:   Discharge Medication List as of 04/08/2012  3:14 PM     2.  The patient was transported to the emergency department via [facility].  Medical Decision Making:  47 year old HIV positive male has a 1 week history of recurring left parasternal chest pain without radiation, usually brought on by meals, no associated nausea, shortness of breath or diaphoresis.  EKG is WNL.  He does have hyperlipidemia, and a positive family history (in second degree relatives).  He needs to be ruled out for new onset angina.  Reuben Likes, MD 04/08/12 [phone]

## 2012-04-08 NOTE — ED Provider Notes (Signed)
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  HIV but last CD4 > 700.  ASA and nitro without improvement.  Not currently symptomatic.  H is very athletic, and has been performing cardiovascular and strength training exercises all week without causing pain - he has a hx of having some difficulty with swallowing - has f/u with his PMD for yearly exam in one week.  Labs negative, pt stable for d/c - doubt cardiac etiology.  On my exam he has clear lungs, clear heart sounds, no reproducible ttp, no edema, and normal MS.   Two trop and two ECG without dynamic changes  I saw and evaluated the patient, reviewed the resident's note and I agree with the findings and plan.  I have also interpreted the ECG and agree with the interpretation of the resident.   Vida Roller, MD 04/09/12 [phone]

## 2012-04-08 NOTE — ED Notes (Signed)
[facility] called and has a truck on the way.  Charge RN [place] made aware of transfer.

## 2012-04-09 NOTE — ED Provider Notes (Signed)
I saw and evaluated the patient, reviewed the resident's note and I agree with the findings and plan.  I have also interpreted the ECG and agree with the interpretation of the resident.   Vida Roller, MD 04/09/12 [phone]

## 2012-04-12 ENCOUNTER — Other Ambulatory Visit: Payer: Self-pay | Admitting: Family Medicine

## 2012-04-13 ENCOUNTER — Other Ambulatory Visit: Payer: Self-pay

## 2012-04-14 ENCOUNTER — Encounter (INDEPENDENT_AMBULATORY_CARE_PROVIDER_SITE_OTHER): Payer: Self-pay

## 2012-04-15 ENCOUNTER — Ambulatory Visit (INDEPENDENT_AMBULATORY_CARE_PROVIDER_SITE_OTHER): Payer: 59 | Admitting: Family Medicine

## 2012-04-15 ENCOUNTER — Encounter: Payer: Self-pay | Admitting: Gastroenterology

## 2012-04-15 ENCOUNTER — Encounter: Payer: Self-pay | Admitting: Family Medicine

## 2012-04-15 VITALS — BP 110/70 | HR 52 | Ht 70.0 in | Wt 184.0 lb

## 2012-04-15 DIAGNOSIS — Z209 Contact with and (suspected) exposure to unspecified communicable disease: Secondary | ICD-10-CM

## 2012-04-15 DIAGNOSIS — K449 Diaphragmatic hernia without obstruction or gangrene: Secondary | ICD-10-CM

## 2012-04-15 DIAGNOSIS — B20 Human immunodeficiency virus [HIV] disease: Secondary | ICD-10-CM

## 2012-04-15 DIAGNOSIS — R4789 Other speech disturbances: Secondary | ICD-10-CM

## 2012-04-15 DIAGNOSIS — E781 Pure hyperglyceridemia: Secondary | ICD-10-CM

## 2012-04-15 DIAGNOSIS — Z87442 Personal history of urinary calculi: Secondary | ICD-10-CM | POA: Insufficient documentation

## 2012-04-15 DIAGNOSIS — Z79899 Other long term (current) drug therapy: Secondary | ICD-10-CM

## 2012-04-15 DIAGNOSIS — R4702 Dysphasia: Secondary | ICD-10-CM

## 2012-04-15 DIAGNOSIS — Z0000 Encounter for general adult medical examination without abnormal findings: Secondary | ICD-10-CM

## 2012-04-15 LAB — POCT URINALYSIS DIPSTICK
Bilirubin, UA: NEGATIVE
Ketones, UA: NEGATIVE
Leukocytes, UA: NEGATIVE
Protein, UA: NEGATIVE
Spec Grav, UA: <=1.005

## 2012-04-15 MED ORDER — OMEGA-3-ACID ETHYL ESTERS 1 G PO CAPS: 2.0000 g | ORAL_CAPSULE | Freq: Two times a day (BID) | ORAL | Status: DC

## 2012-04-15 MED ORDER — ATAZANAVIR SULFATE 300 MG PO CAPS: 300.0000 mg | ORAL_CAPSULE | Freq: Every day | ORAL | Status: DC

## 2012-04-15 MED ORDER — RITONAVIR 100 MG PO CAPS: 100.0000 mg | ORAL_CAPSULE | Freq: Every day | ORAL | Status: DC

## 2012-04-15 NOTE — Progress Notes (Signed)
Subjective:    Patient ID: Darryl Black, male    DOB: 05-Jun-1965, 47 y.o.   MRN: [id number]  HPI He is here for a complete examination. He was recently seen in the emergency room and evaluated for chest pain to the emergency room record labs and x-ray was reviewed. He describes a mid chest discomfort that he says is made worse with drinking cold liquids. He also states that over the last year he has noted increased difficulty with food getting stuck in the same area. He has a previous history of hiatus hernia. He's not on any proton pump inhibitor. He continues on his HIV medications and is having no difficulty with them. He has a previous history of renal stones. He exercises regularly. Social history was reviewed. He is in a long-term relationship and is sexually active intermittently. He would like to be STD testing. His work is going well.   Review of Systems  Constitutional: Negative.   HENT: Negative.   Respiratory: Negative.   Cardiovascular: Positive for chest pain.  Genitourinary: Negative.   Musculoskeletal: Negative.   Neurological: Negative.   Psychiatric/Behavioral: Negative.        Objective:   Physical Exam BP 110/70  Pulse 52  Ht 5\' 10"  (1.778 m)  Wt 184 lb (83.462 kg)  BMI 26.4 kg/m2  SpO2 98%  General Appearance:    Alert, cooperative, no distress, appears stated age  Head:    Normocephalic, without obvious abnormality, atraumatic  Eyes:    PERRL, conjunctiva/corneas clear, EOM's intact, fundi    benign  Ears:    Normal TM's and external ear canals  Nose:   Nares normal, mucosa normal, no drainage or sinus   tenderness  Throat:   Lips, mucosa, and tongue normal; teeth and gums normal  Neck:   Supple, no lymphadenopathy;  thyroid:  no   enlargement/tenderness/nodules; no carotid   bruit or JVD  Back:    Spine nontender, no curvature, ROM normal, no CVA     tenderness  Lungs:     Clear to auscultation bilaterally without wheezes, rales or     ronchi;  respirations unlabored  Chest Wall:    No tenderness or deformity   Heart:    Regular rate and rhythm, S1 and S2 normal, no murmur, rub   or gallop  Breast Exam:    No chest wall tenderness, masses or gynecomastia  Abdomen:     Soft, non-tender, nondistended, normoactive bowel sounds,    no masses, no hepatosplenomegaly  Genitalia:    Normal male external genitalia without lesions.  Testicles without masses.  No inguinal hernias.  Rectal:    Normal sphincter tone, no masses or tenderness; guaiac negative stool.  Prostate smooth, no nodules, not enlarged.  Extremities:   No clubbing, cyanosis or edema  Pulses:   2+ and symmetric all extremities  Skin:   Skin color, texture, turgor normal, no rashes or lesions  Lymph nodes:   Cervical, supraclavicular, and axillary nodes normal  Neurologic:   CNII-XII intact, normal strength, sensation and gait; reflexes 2+ and symmetric throughout          Psych:   Normal mood, affect, hygiene and grooming.           Assessment & Plan:  Routine general medical examination at a health care facility - Plan: POCT Urinalysis Dipstick, Lipid panel  Contact with or exposure to unspecified communicable disease - Plan: RPR  HIV disease - Plan: HIV 1 RNA quant-no reflex-bld,  T-helper cells (CD4) count  Hypertriglyceridemia - Plan: Lipid panel  Dysphasia - Plan: Ambulatory referral to Gastroenterology  Encounter for long-term (current) use of other medications  HH (hiatus hernia)  History of renal stone his chest symptoms to me are most consistent with esophageal stricture. I will refer to GI for further evaluation. Also recommend he take Prilosec but take it at night and not in relation to his HIV medications.

## 2012-04-16 LAB — LIPID PANEL
Cholesterol: 205 mg/dL — ABNORMAL HIGH (ref 0–200)
HDL: 38 mg/dL — ABNORMAL LOW (ref >39)
Total CHOL/HDL Ratio: 5.4 Ratio
Triglycerides: 176 mg/dL — ABNORMAL HIGH (ref <150)
VLDL: 35 mg/dL (ref 0–40)

## 2012-04-16 LAB — T-HELPER CELLS (CD4) COUNT (NOT AT ARMC)
Absolute CD4: 847 /uL (ref 381–1469)
CD4 T Helper %: 28 % — ABNORMAL LOW (ref 32–62)
Total Lymphocyte: 56 % — ABNORMAL HIGH (ref 12–46)
Total lymphocyte count: 3024 /uL (ref 700–3300)

## 2012-04-17 NOTE — Progress Notes (Signed)
Quick Note:  VL is just barely detectable, CD4 is 847. RPR is neg. Continue present meds. Rehceck in 6 months ______

## 2012-04-18 ENCOUNTER — Other Ambulatory Visit: Payer: Self-pay

## 2012-04-18 MED ORDER — OMEPRAZOLE 20 MG PO CPDR: 20.0000 mg | DELAYED_RELEASE_CAPSULE | Freq: Every day | ORAL | Status: DC

## 2012-04-18 NOTE — Progress Notes (Signed)
Quick Note:  CALLED PT INFORMED HIM WORD FOR WORD   VL is just barely detectable, CD4 is 847. RPR is neg. Continue present meds. Rehceck in 6 months PT VERBALIZED UNDERSTANDING   ______

## 2012-04-18 NOTE — Telephone Encounter (Signed)
SENT PRILOSEC IN BECAUSE CHEAPER WITH RX

## 2012-04-22 ENCOUNTER — Encounter: Payer: Self-pay | Admitting: Gastroenterology

## 2012-04-22 ENCOUNTER — Ambulatory Visit (INDEPENDENT_AMBULATORY_CARE_PROVIDER_SITE_OTHER): Payer: 59 | Admitting: Gastroenterology

## 2012-04-22 VITALS — BP 108/74 | HR 64 | Ht 70.0 in | Wt 181.4 lb

## 2012-04-22 DIAGNOSIS — R1319 Other dysphagia: Secondary | ICD-10-CM

## 2012-04-22 DIAGNOSIS — R079 Chest pain, unspecified: Secondary | ICD-10-CM

## 2012-04-22 NOTE — Progress Notes (Signed)
History of Present Illness: This is a 47 year old who relates worsening solid food dysphagia over the past several months and barium esophagram performed in 2-5 revealed a small hiatal hernia reflux and transient hangup of barium tablet. He previously underwent upper endoscopy and colonoscopy by Dr. Danise Edge 2002 for evaluation of weight loss and no abnormalities were noted. Small bowel and colonic biopsies were negative. He noted the onset of a mild left parasternal area discomfort and was seen in the emergency department. Evaluation was unremarkable. On further questioning it appears that his left parasternal discomfort worsens when drinking liquids. He was recently advised to start omeprazole by Dr. Susann Givens but he has not yet started. Denies weight loss, abdominal pain, constipation, diarrhea, change in stool caliber, melena, hematochezia, nausea, vomiting.  Review of Systems: Pertinent positive and negative review of systems were noted in the above HPI section. All other review of systems were otherwise negative.  Current Medications, Allergies, Past Medical History, Past Surgical History, Family History and Social History were reviewed in [employer] record.  Physical Exam: General: Well developed , well nourished, no acute distress Head: Normocephalic and atraumatic Eyes:  sclerae anicteric, EOMI Ears: Normal auditory acuity Mouth: No deformity or lesions Neck: Supple, no masses or thyromegaly Lungs: Clear throughout to auscultation Heart: Regular rate and rhythm; no murmurs, rubs or bruits Abdomen: Soft, non tender and non distended. No masses, hepatosplenomegaly or hernias noted. Normal Bowel sounds Musculoskeletal: Symmetrical with no gross deformities  Skin: No lesions on visible extremities Pulses:  Normal pulses noted Extremities: No clubbing, cyanosis, edema or deformities noted Neurological: Alert oriented x 4, grossly nonfocal Cervical Nodes:  No  significant cervical adenopathy Inguinal Nodes: No significant inguinal adenopathy Psychological:  Alert and cooperative. Normal mood and affect  Assessment and Recommendations:  1. Progressive solid food dysphagia. Chest pain associated with and exacerbated by cold liquids. Rule out esophageal stricture, esophagitis and other disorders. Begin omeprazole as recommended. Schedule upper endoscopy with possible dilation. The risks, benefits, and alternatives to endoscopy with possible biopsy and possible dilation were discussed with the patient and they consent to proceed.   2. Colorectal cancer screening, average risk. Colonoscopy at age 72, January 2016.

## 2012-04-22 NOTE — Patient Instructions (Addendum)
You have been scheduled for an endoscopy with propofol. Please follow written instructions given to you at your visit today. If you use inhalers (even only as needed) or a CPAP machine, please bring them with you on the day of your procedure.  You will be due for a recall colonoscopy in 02/2014. We will send you a reminder in the mail when it gets closer to that time.  Thank you for choosing me and [facility].  Venita Lick. Pleas Koch., MD., [facility] Graham

## 2012-04-30 ENCOUNTER — Encounter: Payer: Self-pay | Admitting: Gastroenterology

## 2012-04-30 ENCOUNTER — Ambulatory Visit (AMBULATORY_SURGERY_CENTER): Payer: 59 | Admitting: Gastroenterology

## 2012-04-30 VITALS — BP 120/73 | HR 58 | Temp 96.1°F | Resp 16 | Ht 70.0 in | Wt 181.0 lb

## 2012-04-30 DIAGNOSIS — R079 Chest pain, unspecified: Secondary | ICD-10-CM

## 2012-04-30 DIAGNOSIS — K2210 Ulcer of esophagus without bleeding: Secondary | ICD-10-CM

## 2012-04-30 DIAGNOSIS — K222 Esophageal obstruction: Secondary | ICD-10-CM

## 2012-04-30 DIAGNOSIS — R1319 Other dysphagia: Secondary | ICD-10-CM

## 2012-04-30 MED ORDER — SODIUM CHLORIDE 0.9 % IV SOLN: 500.0000 mL | INTRAVENOUS | Status: DC

## 2012-04-30 NOTE — Patient Instructions (Signed)
YOU HAD AN ENDOSCOPIC PROCEDURE TODAY AT THE [facility]: Refer to the procedure report that was given to you for any specific questions about what was found during the examination.  If the procedure report does not answer your questions, please call your gastroenterologist to clarify.  If you requested that your care partner not be given the details of your procedure findings, then the procedure report has been included in a sealed envelope for you to review at your convenience later.  YOU SHOULD EXPECT: Some feelings of bloating in the abdomen. Passage of more gas than usual.  Walking can help get rid of the air that was put into your GI tract during the procedure and reduce the bloating. If you had a lower endoscopy (such as a colonoscopy or flexible sigmoidoscopy) you may notice spotting of blood in your stool or on the toilet paper. If you underwent a bowel prep for your procedure, then you may not have a normal bowel movement for a few days.  DIET: follow dilatation diet given to you today  ACTIVITY: Your care partner should take you home directly after the procedure.  You should plan to take it easy, moving slowly for the rest of the day.  You can resume normal activity the day after the procedure however you should NOT DRIVE or use heavy machinery for 24 hours (because of the sedation medicines used during the test).    SYMPTOMS TO REPORT IMMEDIATELY: A gastroenterologist can be reached at any hour.  During normal business hours, 8:30 AM to 5:00 PM Monday through Friday, call [phone].  After hours and on weekends, please call the GI answering service at [phone] who will take a message and have the physician on call contact you.     Following upper endoscopy (EGD)  Vomiting of blood or coffee ground material  New chest pain or pain under the shoulder blades  Painful or persistently difficult swallowing  New shortness of breath  Fever of 100F or higher  Black,  tarry-looking stools  FOLLOW UP: If any biopsies were taken you will be contacted by phone or by letter within the next 1-3 weeks.  Call your gastroenterologist if you have not heard about the biopsies in 3 weeks.  Our staff will call the home number listed on your records the next business day following your procedure to check on you and address any questions or concerns that you may have at that time regarding the information given to you following your procedure. This is a courtesy call and so if there is no answer at the home number and we have not heard from you through the emergency physician on call, we will assume that you have returned to your regular daily activities without incident.  SIGNATURES/CONFIDENTIALITY: You and/or your care partner have signed paperwork which will be entered into your electronic medical record.  These signatures attest to the fact that that the information above on your After Visit Summary has been reviewed and is understood.  Full responsibility of the confidentiality of this discharge information lies with you and/or your care-partner.   Due to dilatation - follow dilatation diet given to you today  Await biopsy results  Continue anti reflux medication (PPI)  FOLLOW ANTI REFLUX REGIMEN ( ORANGE SHEETS GIVEN TO YOU TODAY)

## 2012-04-30 NOTE — Op Note (Signed)
[facility] [address] [place], [address]   ENDOSCOPY PROCEDURE REPORT PATIENT: Darryl Black, Darryl Black  MR#: [id number] BIRTHDATE: 1965/08/08 , 47  yrs. old GENDER: Male ENDOSCOPIST: Meryl Dare, MD, [facility] REFFERED MV:HQIO Susann Givens, M.D. PROCEDURE DATE:  04/30/2012 PROCEDURE:   EGD with dilatation over guidewire and EGD with biopsy  INDICATIONS: 1.  dysphagia.   2.  chest pain. MEDICATIONS: MAC sedation, administered by CRNA, propofol (Diprivan) 300mg  IV, Robinul 0.2 mg IV, lidocaine 20 mg IV TOPICAL ANESTHETIC: Cetacaine Spray DESCRIPTION OF PROCEDURE:   After the risks benefits and alternatives of the procedure were thoroughly explained, informed consent was obtained.  The     endoscope was introduced through the mouth and advanced to the second portion of the duodenum. The instrument was slowly withdrawn as the mucosa was carefully examined.  Prior to withdrawal of the scope, the guidwire was placed.  The esophagus was dilated successfully.  The patient was recovered in endoscopy and discharged home in satisfactory condition.  ESOPHAGUS: A benign appearinge stricture was found at the gastroesophageal junction.  The stenosis was traversable with the endoscope.  It measured about 12 mm in diameter. The esophagus was otherwise normal.  3 mm nodule just above EGJ.  Biopsies obtained and sent to pathology. STOMACH: The mucosa and folds of the stomach appeared normal. DUODENUM: The duodenal mucosa showed no abnormalities in the 2nd part of the duodenum. Dilation was then performed at the gastroesphageal junction: Dilator: Savary over guidewire Sizes: 14, 15 and 16 mm; Resistance: minimal; Heme: yes, miminal COMPLICATIONS: There were no complications.  ENDOSCOPIC IMPRESSION: 1.   Stricture at the gastroesophageal junction; dilated 2.   Small hiatal hernia 3.   3 mm esophageal nodule; biopsies obtained  RECOMMENDATIONS: 1.  anti-reflux regimen 2.  await  pathology results 3.  continue PPI 4.  post dilation instructions  eSigned:  Meryl Dare, MD, [facility] 04/30/2012 2:56 PM

## 2012-04-30 NOTE — Progress Notes (Signed)
Patient did not experience any of the following events: a burn prior to discharge; a fall within the facility; wrong site/side/patient/procedure/implant event; or a hospital transfer or hospital admission upon discharge from the facility. (G8907) Patient did not have preoperative order for IV antibiotic SSI prophylaxis. (G8918)  

## 2012-05-01 ENCOUNTER — Telehealth: Payer: Self-pay | Admitting: *Deleted

## 2012-05-01 NOTE — Telephone Encounter (Signed)
Name identifier, left message, follow-up 

## 2012-05-06 ENCOUNTER — Encounter: Payer: Self-pay | Admitting: Gastroenterology

## 2012-05-27 ENCOUNTER — Encounter: Payer: Self-pay | Admitting: Family Medicine

## 2012-05-27 ENCOUNTER — Ambulatory Visit (INDEPENDENT_AMBULATORY_CARE_PROVIDER_SITE_OTHER): Payer: 59 | Admitting: Family Medicine

## 2012-05-27 VITALS — BP 124/80 | HR 74 | Wt 181.0 lb

## 2012-05-27 DIAGNOSIS — IMO0002 Reserved for concepts with insufficient information to code with codable children: Secondary | ICD-10-CM

## 2012-05-27 DIAGNOSIS — S39011A Strain of muscle, fascia and tendon of abdomen, initial encounter: Secondary | ICD-10-CM

## 2012-05-27 NOTE — Progress Notes (Signed)
  Subjective:    Patient ID: Darryl Black., male    DOB: 09-01-1965, 47 y.o.   MRN: [id number]  HPI He has a one-week history of left lower quadrant discomfort. He notes that it increases with physical activity or with pressing in his left lower quadrant. Food makes no difference. He has had no diarrhea or constipation; seen no blood in his stool. He has had no polyuria, dysuria or discharge . He has not been working out like he normally does due to lack of time. He has no history of injury.  Review of Systems     Objective:   Physical Exam Alert and in no distress. Abdominal exam shows no masses. He is tender along the left lower abdominal musculature laterally. No hernia noted. Genitalia normal.       Assessment & Plan:  Abdominal wall strain, initial encounter reassured him that I did not think getting of significance was going on. Recommend supportive care with heat, anti-inflammatory and relative rest.

## 2012-05-27 NOTE — Patient Instructions (Signed)
Treat this conservatively. You can use heat for 20 minutes 3 times per day. If something hurts back off

## 2012-08-21 ENCOUNTER — Encounter: Payer: Self-pay | Admitting: Medical

## 2012-08-21 ENCOUNTER — Ambulatory Visit (INDEPENDENT_AMBULATORY_CARE_PROVIDER_SITE_OTHER): Payer: 59 | Admitting: Medical

## 2012-08-21 VITALS — BP 108/70 | HR 76 | Temp 98.5°F | Resp 16 | Wt 185.0 lb

## 2012-08-21 DIAGNOSIS — J069 Acute upper respiratory infection, unspecified: Secondary | ICD-10-CM

## 2012-08-21 MED ORDER — BENZONATATE 200 MG PO CAPS: 200.0000 mg | ORAL_CAPSULE | Freq: Three times a day (TID) | ORAL | Status: DC | PRN

## 2012-08-21 NOTE — Progress Notes (Signed)
Subjective:  Darryl Stejskal. is a 47 y.o. male who presents for 4-5 day hx/o cough, sore throat, runny nose, had congestion.  Denies fever, chills, headache, NVD, wheezing, SOB.  He is HIV+, on therapy, recent viral load non detectable, recent CD4 800.  No sick contacts.  Using nothing for symptoms.  No other aggravating or relieving factors.  No other c/o.  Past Medical History  Diagnosis Date  . HIV positive 89  . Renal stone   . Dyslipidemia   . HH (hiatus hernia)   . Gilbert's disease   . GERD (gastroesophageal reflux disease)   . Hyperlipidemia    ROS as in subjective  Objective:  Filed Vitals:   08/21/12 1410  BP: 108/70  Pulse: 76  Temp: 98.5 F (36.9 C)  Resp: 16    General appearance: Alert, WD/WN, no distress, mildly ill appearing                             Skin: warm, no rash                           Head: no sinus tenderness                            Eyes: conjunctiva normal, corneas clear, PERRLA                            Ears: pearly TMs, external ear canals normal                          Nose: septum midline, turbinates swollen, with erythema and clear discharge             Mouth/throat: MMM, tongue normal, mild pharyngeal erythema                           Neck: supple, no adenopathy, no thyromegaly, nontender                          Heart: RRR, normal S1, S2, no murmurs                         Lungs: CTA bilaterally, no wheezes, rales, or rhonchi     Assessment and Plan: Encounter Diagnosis  Name Primary?  Marland Kitchen Upper respiratory infection Yes    Discussed diagnosis and treatment of URI.  prescription for Tessalon Perles, begin OTC Mucinex DM.  Suggested symptomatic OTC remedies.  Nasal saline spray for congestion.  Tylenol or Ibuprofen OTC for fever and malaise.  Call/return in 2-3 days if symptoms aren't resolving.

## 2012-08-27 ENCOUNTER — Other Ambulatory Visit: Payer: Self-pay | Admitting: Medical

## 2012-08-27 ENCOUNTER — Telehealth: Payer: Self-pay | Admitting: Medical

## 2012-08-27 MED ORDER — AMOXICILLIN 875 MG PO TABS: 875.0000 mg | ORAL_TABLET | Freq: Two times a day (BID) | ORAL | Status: DC

## 2012-08-27 NOTE — Telephone Encounter (Signed)
Amoxicillin sent

## 2012-08-27 NOTE — Telephone Encounter (Signed)
caled pt to inform that meds sent

## 2012-10-09 ENCOUNTER — Other Ambulatory Visit: Payer: Self-pay | Admitting: Family Medicine

## 2012-11-13 ENCOUNTER — Other Ambulatory Visit: Payer: Self-pay | Admitting: Family Medicine

## 2012-11-13 ENCOUNTER — Ambulatory Visit (INDEPENDENT_AMBULATORY_CARE_PROVIDER_SITE_OTHER): Payer: 59 | Admitting: Family Medicine

## 2012-11-13 ENCOUNTER — Encounter: Payer: Self-pay | Admitting: Family Medicine

## 2012-11-13 VITALS — BP 120/84 | HR 76 | Temp 98.4°F | Ht 69.0 in | Wt 183.0 lb

## 2012-11-13 DIAGNOSIS — J029 Acute pharyngitis, unspecified: Secondary | ICD-10-CM

## 2012-11-13 LAB — POCT RAPID STREP A (OFFICE): Rapid Strep A Screen: NEGATIVE

## 2012-11-13 NOTE — Progress Notes (Signed)
Chief Complaint  Patient presents with  . Sore Throat    x 2-3 days, no other symptoms.   He is complaining of sore throat x 2-3 days.  He is going on vacation next week, so wants to make sure nothing is wrong that needs to be treated. He denies any runny nose, sneezing, itchy/watery eyes. Denies fevers.  No cough, shortness of breath, chest congestion, wheezing  Had oral sex 10 days ago and is worried about STD.  Wants check for gonorrhea.  Past Medical History  Diagnosis Date  . HIV positive 89  . Renal stone   . Dyslipidemia   . HH (hiatus hernia)   . Gilbert's disease   . GERD (gastroesophageal reflux disease)   . Hyperlipidemia    Past Surgical History  Procedure Laterality Date  . Appendectomy    . Upper gastrointestinal endoscopy     History   Social History  . Marital Status: Single    Spouse Name: N/A    Number of Children: N/A  . Years of Education: N/A   Occupational History  . Mental Health Counselor    Social History Main Topics  . Smoking status: Never Smoker   . Smokeless tobacco: Never Used  . Alcohol Use: No  . Drug Use: No  . Sexual Activity: Yes   Other Topics Concern  . Not on file   Social History Narrative  . No narrative on file    Current outpatient prescriptions:cholecalciferol (VITAMIN D) 1000 UNITS tablet, Take 1,000 Units by mouth daily., Disp: , Rfl: ;  EPZICOM 600-300 MG per tablet, TAKE 1 TABLET BY MOUTH DAILY., Disp: 90 tablet, Rfl: 3;  fenofibrate 160 MG tablet, TAKE 1 TABLET BY MOUTH ONCE DAILY, Disp: 90 tablet, Rfl: 4;  Multiple Vitamin (MULTIVITAMIN WITH MINERALS) TABS, Take 1 tablet by mouth daily., Disp: , Rfl:  omega-3 acid ethyl esters (LOVAZA) 1 G capsule, Take 2 capsules (2 g total) by mouth 2 (two) times daily., Disp: 360 capsule, Rfl: 3;  REYATAZ 300 MG capsule, TAKE 1 CAPSULE BY MOUTH ONCE DAILY, Disp: 90 capsule, Rfl: 4;  ritonavir (NORVIR) 100 MG capsule, Take 1 capsule (100 mg total) by mouth daily., Disp: 90 capsule,  Rfl: 3;  omeprazole (PRILOSEC) 20 MG capsule, Take 1 capsule (20 mg total) by mouth daily., Disp: 90 capsule, Rfl: 3  No Known Allergies  ROS:  No nausea or vomiting.  +diarrhea x 1 day, possibly related to travel/change in diet.  Denies abdominal pain, dysuria, rashes, bleeding/bruising, myalgias, arthralgias  PHYSICAL EXAM:  BP 120/84  Pulse 76  Temp(Src) 98.4 F (36.9 C) (Oral)  Ht 5\' 9"  (1.753 m)  Wt 183 lb (83.008 kg)  BMI 27.01 kg/m2 Pleasant male in no distress HEENT:  PERRL, EOMI, conjunctiva clear.  Tm's and EAC's normal.  Nasal mucosa normal, no drainage.  Sinuses nontender.  OP: Small ulcerations on soft palate, one on each side.  No soft tissue swelling. No exudates Neck: no lymphadenopathy or mass Heart: regular rate and rhythm without murmur Lungs: clear bilaterally Skin: no rash  Rapid strep negative  ASSESSMENT/PLAN:  Sore throat - Plan: Rapid Strep A, CANCELED: GC Culture Only   Findings are consistent with viral etiology (ie coxsackie).  Supportive measures reviewed in detail

## 2012-11-13 NOTE — Patient Instructions (Addendum)
We will be in touch with culture results. Use tylenol, ibuprofen and salt water gargles for symptom relief.

## 2013-01-02 ENCOUNTER — Other Ambulatory Visit: Payer: Self-pay

## 2013-04-15 ENCOUNTER — Other Ambulatory Visit: Payer: Self-pay | Admitting: Family Medicine

## 2013-04-16 NOTE — Telephone Encounter (Signed)
He needs a follow-up visit 

## 2013-04-16 NOTE — Telephone Encounter (Signed)
Have him set up a followup visit.

## 2013-04-16 NOTE — Telephone Encounter (Signed)
Is this okay to refill? 

## 2013-04-16 NOTE — Telephone Encounter (Signed)
Called and left message for pt to call back and schedule and appt

## 2013-04-29 ENCOUNTER — Encounter: Payer: Self-pay | Admitting: Family Medicine

## 2013-04-29 ENCOUNTER — Ambulatory Visit (INDEPENDENT_AMBULATORY_CARE_PROVIDER_SITE_OTHER): Payer: 59 | Admitting: Family Medicine

## 2013-04-29 VITALS — BP 130/80 | HR 60 | Ht 70.0 in | Wt 190.0 lb

## 2013-04-29 DIAGNOSIS — Z87442 Personal history of urinary calculi: Secondary | ICD-10-CM

## 2013-04-29 DIAGNOSIS — Z0000 Encounter for general adult medical examination without abnormal findings: Secondary | ICD-10-CM

## 2013-04-29 DIAGNOSIS — B20 Human immunodeficiency virus [HIV] disease: Secondary | ICD-10-CM

## 2013-04-29 DIAGNOSIS — Z209 Contact with and (suspected) exposure to unspecified communicable disease: Secondary | ICD-10-CM

## 2013-04-29 DIAGNOSIS — E781 Pure hyperglyceridemia: Secondary | ICD-10-CM

## 2013-04-29 LAB — COMPREHENSIVE METABOLIC PANEL
ALBUMIN: 4.9 g/dL (ref 3.5–5.2)
ALK PHOS: 44 U/L (ref 39–117)
ALT: 86 U/L — AB (ref 0–53)
AST: 38 U/L — ABNORMAL HIGH (ref 0–37)
BUN: 12 mg/dL (ref 6–23)
CO2: 28 mEq/L (ref 19–32)
Calcium: 10.2 mg/dL (ref 8.4–10.5)
Chloride: 99 mEq/L (ref 96–112)
Creat: 1.44 mg/dL — ABNORMAL HIGH (ref 0.50–1.35)
GLUCOSE: 81 mg/dL (ref 70–99)
POTASSIUM: 4.2 meq/L (ref 3.5–5.3)
SODIUM: 134 meq/L — AB (ref 135–145)
TOTAL PROTEIN: 7.4 g/dL (ref 6.0–8.3)
Total Bilirubin: 2 mg/dL — ABNORMAL HIGH (ref 0.2–1.2)

## 2013-04-29 LAB — CBC WITH DIFFERENTIAL/PLATELET
BASOS PCT: 1 % (ref 0–1)
Basophils Absolute: 0.1 10*3/uL (ref 0.0–0.1)
Eosinophils Absolute: 0.2 10*3/uL (ref 0.0–0.7)
Eosinophils Relative: 2 % (ref 0–5)
HCT: 44.7 % (ref 39.0–52.0)
HEMOGLOBIN: 15.7 g/dL (ref 13.0–17.0)
LYMPHS ABS: 3.8 10*3/uL (ref 0.7–4.0)
Lymphocytes Relative: 51 % — ABNORMAL HIGH (ref 12–46)
MCH: 34.2 pg — ABNORMAL HIGH (ref 26.0–34.0)
MCHC: 35.1 g/dL (ref 30.0–36.0)
MCV: 97.4 fL (ref 78.0–100.0)
MONOS PCT: 11 % (ref 3–12)
Monocytes Absolute: 0.8 10*3/uL (ref 0.1–1.0)
NEUTROS ABS: 2.6 10*3/uL (ref 1.7–7.7)
NEUTROS PCT: 35 % — AB (ref 43–77)
Platelets: 235 10*3/uL (ref 150–400)
RBC: 4.59 MIL/uL (ref 4.22–5.81)
RDW: 13.6 % (ref 11.5–15.5)
WBC: 7.5 10*3/uL (ref 4.0–10.5)

## 2013-04-29 LAB — LIPID PANEL
Cholesterol: 238 mg/dL — ABNORMAL HIGH (ref 0–200)
HDL: 43 mg/dL (ref >39)
LDL CALC: 154 mg/dL — AB (ref 0–99)
TRIGLYCERIDES: 204 mg/dL — AB (ref <150)
Total CHOL/HDL Ratio: 5.5 Ratio
VLDL: 41 mg/dL — ABNORMAL HIGH (ref 0–40)

## 2013-04-29 MED ORDER — RITONAVIR 100 MG PO CAPS: 100.0000 mg | ORAL_CAPSULE | Freq: Every day | ORAL | Status: DC

## 2013-04-29 MED ORDER — OMEGA-3-ACID ETHYL ESTERS 1 G PO CAPS: 2.0000 g | ORAL_CAPSULE | Freq: Two times a day (BID) | ORAL | Status: DC

## 2013-04-29 MED ORDER — REYATAZ 300 MG PO CAPS: ORAL_CAPSULE | ORAL | Status: DC

## 2013-04-29 MED ORDER — EPZICOM 600-300 MG PO TABS: ORAL_TABLET | ORAL | Status: DC

## 2013-04-29 NOTE — Progress Notes (Signed)
   Subjective:    Patient ID: Darryl Abide., male    DOB: 06-24-1965, 48 y.o.   MRN: [id number]  HPI He is here for complete examination. He continues on his HIV medications and is having no difficulty with them. He does have reflux disease but presently is not taking any medication and states he doesn't need it. He has a history of renal stone but has had no difficulty with that. He also has a history of hypertriglyceridemia. He continues to work. He recently got married. He has been in a 22 year relationship. He would like to have STD testing to be safe. Social and family history were reviewed.  Review of Systems  All other systems reviewed and are negative.       Objective:   Physical Exam BP 130/80  Pulse 60  Ht 5\' 10"  (1.778 m)  Wt 190 lb (86.183 kg)  BMI 27.26 kg/m2  General Appearance:    Alert, cooperative, no distress, appears stated age  Head:    Normocephalic, without obvious abnormality, atraumatic  Eyes:    PERRL, conjunctiva/corneas clear, EOM's intact, fundi    benign  Ears:    Normal TM's and external ear canals  Nose:   Nares normal, mucosa normal, no drainage or sinus   tenderness  Throat:   Lips, mucosa, and tongue normal; teeth and gums normal  Neck:   Supple, no lymphadenopathy;  thyroid:  no   enlargement/tenderness/nodules; no carotid   bruit or JVD  Back:    Spine nontender, no curvature, ROM normal, no CVA     tenderness  Lungs:     Clear to auscultation bilaterally without wheezes, rales or     ronchi; respirations unlabored  Chest Wall:    No tenderness or deformity   Heart:    Regular rate and rhythm, S1 and S2 normal, no murmur, rub   or gallop  Breast Exam:    No chest wall tenderness, masses or gynecomastia  Abdomen:     Soft, non-tender, nondistended, normoactive bowel sounds,    no masses, no hepatosplenomegaly        Extremities:   No clubbing, cyanosis or edema  Pulses:   2+ and symmetric all extremities  Skin:   Skin color, texture,  turgor normal, no rashes or lesions  Lymph nodes:   Cervical, supraclavicular, and axillary nodes normal  Neurologic:   CNII-XII intact, normal strength, sensation and gait; reflexes 2+ and symmetric throughout          Psych:   Normal mood, affect, hygiene and grooming.          Assessment & Plan:  HIV disease - Plan: CBC with Differential, Comprehensive metabolic panel, HIV 1 RNA quant-no reflex-bld, T-helper cells (CD4) count, ritonavir (NORVIR) 100 MG capsule, REYATAZ 300 MG capsule, EPZICOM 600-300 MG per tablet  Hypertriglyceridemia - Plan: Lipid panel, omega-3 acid ethyl esters (LOVAZA) 1 G capsule  Contact with or exposure to unspecified communicable disease - Plan: GC/chlamydia probe amp, urine  History of renal stone  Routine general medical examination at a health care facility  in general things are going quite well for him. He will get a colonoscopy when he turns 67.

## 2013-04-30 LAB — T-HELPER CELLS (CD4) COUNT (NOT AT ARMC)
Absolute CD4: 1148 /uL (ref 381–1469)
CD4 T HELPER %: 30 % — AB (ref 32–62)
Total Lymphocyte: 51 % — ABNORMAL HIGH (ref 12–46)
Total lymphocyte count: 3825 /uL — ABNORMAL HIGH (ref 700–3300)
WBC, LYMPH ENUMERATION: 7.5 10*3/uL (ref 4.0–10.5)

## 2013-04-30 LAB — GC/CHLAMYDIA PROBE AMP, URINE
CHLAMYDIA, SWAB/URINE, PCR: NEGATIVE
GC PROBE AMP, URINE: NEGATIVE

## 2013-04-30 LAB — RPR

## 2013-05-01 LAB — HIV-1 RNA QUANT-NO REFLEX-BLD: HIV 1 RNA Quant: <20 copies/mL (ref <20)

## 2013-08-21 ENCOUNTER — Ambulatory Visit (INDEPENDENT_AMBULATORY_CARE_PROVIDER_SITE_OTHER): Payer: 59 | Admitting: Family Medicine

## 2013-08-21 ENCOUNTER — Encounter: Payer: Self-pay | Admitting: Family Medicine

## 2013-08-21 VITALS — BP 104/60 | HR 72 | Ht 69.0 in | Wt 183.0 lb

## 2013-08-21 DIAGNOSIS — G4700 Insomnia, unspecified: Secondary | ICD-10-CM

## 2013-08-21 MED ORDER — ZOLPIDEM TARTRATE 10 MG PO TABS: 5.0000 mg | ORAL_TABLET | Freq: Every evening | ORAL | Status: DC | PRN

## 2013-08-21 NOTE — Progress Notes (Signed)
Chief Complaint  Patient presents with  . Insomnia    works 12hr night shifts x 20 years-usually works 3 in a row and is fine. If he works any extra shifts, which he has been lately he cannot stay asleep. Awakens around 4:00am, feels like his while sleep cycle gets "out of whack."   He is having trouble sleeping over the last 2-3 months.  As stated above per nurse, he has worked 12 hr night shifts for a long time, usually just 3 nights in a row, and has a routine which works for him.  He usually sets alarm to wake up at 12:30 pm on the day after the last shift, and then gets into a normal "day" routine for the next 3 days, before going back to work.    He normal works Fri/Sat/Sun 7p-7:30am, then transitions to a more normal schedule for the rest of the week (makes himself get up 12:30p Monday, so that he will sleep Monday night).  He has been working extra shifts lately, which has affected his schedule, affected the normal rhythm of his routine. The extra shifts are usually tacking on Thursday, sometimes both Wed and Thurs nights.  (Next week he is working Wed-Sun (60 hrs)).  He finds that whenever he works an extra shift, he has trouble staying asleep the Monday night (even if still setting the alarm for 12:30)--he will sleep only a few hours, and then will wake up and be unable to get back to sleep.  Then he is exhausted by the following afternoon.  He never has problems falling asleep, but has trouble staying asleep for more than 4 hours.  He tried z-quil when trying to switch back to sleeping at night--felt very groggy the next morning "and messed up" for the next couple of days. He has taken [place] twice without problems in the past.  Past Medical History  Diagnosis Date  . HIV positive 89  . Renal stone   . Dyslipidemia   . HH (hiatus hernia)   . Gilbert's disease   . GERD (gastroesophageal reflux disease)   . Hyperlipidemia    Past Surgical History  Procedure Laterality Date  .  Appendectomy    . Upper gastrointestinal endoscopy     History   Social History  . Marital Status: Single    Spouse Name: N/A    Number of Children: N/A  . Years of Education: N/A   Occupational History  . Mental Health Counselor    Social History Main Topics  . Smoking status: Never Smoker   . Smokeless tobacco: Never Used  . Alcohol Use: No  . Drug Use: No  . Sexual Activity: Yes   Other Topics Concern  . Not on file   Social History Narrative  . No narrative on file   Current Outpatient Prescriptions on File Prior to Visit  Medication Sig Dispense Refill  . cholecalciferol (VITAMIN D) 1000 UNITS tablet Take 1,000 Units by mouth daily.      Marland Kitchen EPZICOM 600-300 MG per tablet TAKE 1 TABLET BY MOUTH DAILY.  90 tablet  3  . fenofibrate 160 MG tablet TAKE 1 TABLET BY MOUTH ONCE DAILY  90 tablet  4  . Multiple Vitamin (MULTIVITAMIN WITH MINERALS) TABS Take 1 tablet by mouth daily.      Marland Kitchen omega-3 acid ethyl esters (LOVAZA) 1 G capsule Take 2 capsules (2 g total) by mouth 2 (two) times daily.  360 capsule  3  . REYATAZ 300 MG  capsule TAKE 1 CAPSULE BY MOUTH ONCE DAILY  90 capsule  3  . ritonavir (NORVIR) 100 MG capsule Take 1 capsule (100 mg total) by mouth daily.  90 capsule  3   No current facility-administered medications on file prior to visit.   No Known Allergies  ROS:  Denies depression, anxiety, headaches, or any other complaints  PHYSICAL EXAM: BP 104/60  Pulse 72  Ht 5\' 9"  (1.753 m)  Wt 183 lb (83.008 kg)  BMI 27.01 kg/m2  PF 183 L/min Well developed, pleasant male in no distress Normal mood, affect hygiene and grooming.  Remainder of visit was limited to counseling  ASSESSMENT/PLAN:  Insomnia - Plan: zolpidem (AMBIEN) 10 MG tablet  We discussed various treatment options.  These included ambien, ambient CR, vs Sonata prn when he wakes up (shorter acting, he isn't working the next day, so okay if he slept longer).  Since he tolerated ambien in the past,  let's try this first.  He will plan on taking this (scheduled, not prn) only on the Monday nights after he has worked moe than 3 shifts in a row.  If he fails to stay asleep, then can try changing to Ambien CR.  We can also consider Sonata if ineffective.    We discussed shift changes and the effects on sleep.  We discussed melatonin.  We discussed how some people end up needing both stimulants (to stay awake) and sleeping pills to sleep, but usually when their shifts are changing. I do not think he will need this.  For when he is working 5 days in a row (next week), he probably should just stay in the night shift mode, will have a tough time with such a short transition.  Risks and side effects were reviewed in detail.  All questions answered.  Visit was 25 mins, all spent counseling.

## 2013-08-21 NOTE — Patient Instructions (Signed)
Take the ambien on Monday nights after you have worked extra shifts.  Make sure you have 8 hours, and stop taking it if you notice any unusual behaviors.  If it doesn't help you stay asleep through the night, the other options are trying the ambien CR (controlled release, usually helps people stay asleep longer), vs using Sonata just as needed if/when you wake up in the middle of the night--you need at least 4-5 hours after taking it).

## 2013-10-14 ENCOUNTER — Other Ambulatory Visit: Payer: Self-pay | Admitting: Family Medicine

## 2013-10-14 NOTE — Telephone Encounter (Signed)
IS THIS OKAY 

## 2013-10-30 ENCOUNTER — Ambulatory Visit (INDEPENDENT_AMBULATORY_CARE_PROVIDER_SITE_OTHER): Payer: 59 | Admitting: Family Medicine

## 2013-10-30 ENCOUNTER — Encounter: Payer: Self-pay | Admitting: Family Medicine

## 2013-10-30 VITALS — BP 114/70 | Wt 184.0 lb

## 2013-10-30 DIAGNOSIS — Z87442 Personal history of urinary calculi: Secondary | ICD-10-CM

## 2013-10-30 DIAGNOSIS — E781 Pure hyperglyceridemia: Secondary | ICD-10-CM

## 2013-10-30 DIAGNOSIS — B20 Human immunodeficiency virus [HIV] disease: Secondary | ICD-10-CM

## 2013-10-30 DIAGNOSIS — Z79899 Other long term (current) drug therapy: Secondary | ICD-10-CM

## 2013-10-30 LAB — CBC WITH DIFFERENTIAL/PLATELET
BASOS ABS: 0 10*3/uL (ref 0.0–0.1)
Basophils Relative: 0 % (ref 0–1)
Eosinophils Absolute: 0.2 10*3/uL (ref 0.0–0.7)
Eosinophils Relative: 3 % (ref 0–5)
HEMATOCRIT: 45.3 % (ref 39.0–52.0)
Hemoglobin: 16.2 g/dL (ref 13.0–17.0)
LYMPHS ABS: 3.6 10*3/uL (ref 0.7–4.0)
LYMPHS PCT: 47 % — AB (ref 12–46)
MCH: 35.4 pg — ABNORMAL HIGH (ref 26.0–34.0)
MCHC: 35.8 g/dL (ref 30.0–36.0)
MCV: 98.9 fL (ref 78.0–100.0)
MONO ABS: 0.9 10*3/uL (ref 0.1–1.0)
Monocytes Relative: 12 % (ref 3–12)
Neutro Abs: 2.9 10*3/uL (ref 1.7–7.7)
Neutrophils Relative %: 38 % — ABNORMAL LOW (ref 43–77)
Platelets: 244 10*3/uL (ref 150–400)
RBC: 4.58 MIL/uL (ref 4.22–5.81)
RDW: 13.4 % (ref 11.5–15.5)
WBC: 7.6 10*3/uL (ref 4.0–10.5)

## 2013-10-30 LAB — COMPREHENSIVE METABOLIC PANEL
ALT: 48 U/L (ref 0–53)
AST: 21 U/L (ref 0–37)
Albumin: 4.6 g/dL (ref 3.5–5.2)
Alkaline Phosphatase: 53 U/L (ref 39–117)
BILIRUBIN TOTAL: 1.9 mg/dL — AB (ref 0.2–1.2)
BUN: 14 mg/dL (ref 6–23)
CALCIUM: 9.9 mg/dL (ref 8.4–10.5)
CHLORIDE: 103 meq/L (ref 96–112)
CO2: 25 mEq/L (ref 19–32)
Creat: 1.44 mg/dL — ABNORMAL HIGH (ref 0.50–1.35)
Glucose, Bld: 99 mg/dL (ref 70–99)
Potassium: 4.2 mEq/L (ref 3.5–5.3)
Sodium: 137 mEq/L (ref 135–145)
Total Protein: 7.2 g/dL (ref 6.0–8.3)

## 2013-10-30 LAB — LIPID PANEL
CHOL/HDL RATIO: 4.4 ratio
Cholesterol: 209 mg/dL — ABNORMAL HIGH (ref 0–200)
HDL: 48 mg/dL (ref >39)
LDL CALC: 123 mg/dL — AB (ref 0–99)
Triglycerides: 190 mg/dL — ABNORMAL HIGH (ref <150)
VLDL: 38 mg/dL (ref 0–40)

## 2013-10-30 NOTE — Progress Notes (Signed)
   Subjective:    Patient ID: Darryl Abide., male    DOB: 13-Jul-1965, 48 y.o.   MRN: [id number]  HPI He is here for a followup visit. He continues on medications listed in the chart and is having no difficulty with them. He has had no fever, chills, headache, weight change, GI or pulmonary symptoms. His work and home life are going quite well. He works out regularly. Does not drink or smoke. Does have a previous history of renal stone but none recently.   Review of Systems     Objective:   Physical Exam alert and in no distress. Tympanic membranes and canals are normal. Throat is clear. Tonsils are normal. Neck is supple without adenopathy or thyromegaly. Cardiac exam shows a regular sinus rhythm without murmurs or gallops. Lungs are clear to auscultation. Abdominal exam shows no masses or tenderness. No axillary or inguinal adenopathy. Fundi benign.        Assessment & Plan:  HIV disease - Plan: CBC with Differential, Comprehensive metabolic panel, Lipid panel, HIV 1 RNA quant-no reflex-bld, T-helper cells (CD4) count  Hypertriglyceridemia - Plan: Lipid panel  History of renal stone  Encounter for long-term (current) use of other medications - Plan: CBC with Differential, Comprehensive metabolic panel, Lipid panel, HIV 1 RNA quant-no reflex-bld, T-helper cells (CD4) count  discussed his kidney stone in regard to coming to me. Encouraged him to call if he has difficulty during office hours to expedite getting care for.

## 2013-10-31 LAB — T-HELPER CELLS (CD4) COUNT (NOT AT ARMC)
Absolute CD4: 1179 /uL (ref 381–1469)
CD4 T Helper %: 33 % (ref 32–62)
Total Lymphocyte: 47 % — ABNORMAL HIGH (ref 12–46)
Total lymphocyte count: 3572 /uL — ABNORMAL HIGH (ref 700–3300)
WBC, lymph enumeration: 7.6 10*3/uL (ref 4.0–10.5)

## 2013-10-31 LAB — HIV-1 RNA QUANT-NO REFLEX-BLD
HIV 1 RNA QUANT: 35 {copies}/mL — AB (ref <20)
HIV-1 RNA QUANT, LOG: 1.54 {Log} — AB (ref <1.30)

## 2013-11-07 ENCOUNTER — Ambulatory Visit (INDEPENDENT_AMBULATORY_CARE_PROVIDER_SITE_OTHER): Payer: 59 | Admitting: Family Medicine

## 2013-11-07 ENCOUNTER — Encounter: Payer: Self-pay | Admitting: Family Medicine

## 2013-11-07 VITALS — BP 124/80 | HR 77 | Wt 184.0 lb

## 2013-11-07 DIAGNOSIS — L989 Disorder of the skin and subcutaneous tissue, unspecified: Secondary | ICD-10-CM

## 2013-11-07 NOTE — Progress Notes (Signed)
   Subjective:    Patient ID: Darryl Abide., male    DOB: Mar 06, 1965, 48 y.o.   MRN: [id number]  HPI He is here for evaluation of lesions present for almost one week. He has not been exposed to any new chemicals. He has not done any yard work. His partner has no lesions.   Review of Systems     Objective:   Physical Exam Scattered erythematous macular lesions some of which appear slightly vesicular are noted. One of the lesions was scraped however nothing was seen.       Assessment & Plan:  Skin lesions  these could be scabies but I could not prove it. I will refer him to dermatology.

## 2013-12-12 ENCOUNTER — Other Ambulatory Visit: Payer: Self-pay

## 2014-02-03 ENCOUNTER — Telehealth: Payer: Self-pay | Admitting: Family Medicine

## 2014-02-03 ENCOUNTER — Other Ambulatory Visit: Payer: Self-pay

## 2014-02-03 MED ORDER — RITONAVIR 100 MG PO TABS: 100.0000 mg | ORAL_TABLET | Freq: Every day | ORAL | Status: DC

## 2014-02-03 NOTE — Telephone Encounter (Signed)
I am fine with this.

## 2014-02-03 NOTE — Telephone Encounter (Signed)
Pharmacy called requesting authorization to change Norvir 100MG  from soft gel capsules to tablets. Per Pharmacist at [facility], soft gels have been discontinued.

## 2014-02-03 NOTE — Telephone Encounter (Signed)
i changed med to tabs and resent med in

## 2014-02-12 ENCOUNTER — Other Ambulatory Visit: Payer: Self-pay | Admitting: Family Medicine

## 2014-02-13 ENCOUNTER — Other Ambulatory Visit: Payer: Self-pay

## 2014-02-13 MED ORDER — ZOLPIDEM TARTRATE 10 MG PO TABS: 5.0000 mg | ORAL_TABLET | Freq: Every evening | ORAL | Status: DC | PRN

## 2014-02-13 NOTE — Telephone Encounter (Signed)
Called in ambien per jcl 

## 2014-04-16 ENCOUNTER — Other Ambulatory Visit: Payer: Self-pay | Admitting: Family Medicine

## 2014-04-16 NOTE — Telephone Encounter (Signed)
Are these meds okay to refill?

## 2014-04-30 ENCOUNTER — Other Ambulatory Visit: Payer: Self-pay | Admitting: Family Medicine

## 2014-06-03 ENCOUNTER — Encounter: Payer: Self-pay | Admitting: Family Medicine

## 2014-06-03 ENCOUNTER — Ambulatory Visit (INDEPENDENT_AMBULATORY_CARE_PROVIDER_SITE_OTHER): Payer: 59 | Admitting: Family Medicine

## 2014-06-03 VITALS — BP 112/70 | HR 60 | Ht 70.0 in | Wt 185.0 lb

## 2014-06-03 DIAGNOSIS — Z0000 Encounter for general adult medical examination without abnormal findings: Secondary | ICD-10-CM | POA: Diagnosis not present

## 2014-06-03 DIAGNOSIS — Z209 Contact with and (suspected) exposure to unspecified communicable disease: Secondary | ICD-10-CM | POA: Diagnosis not present

## 2014-06-03 DIAGNOSIS — Z87442 Personal history of urinary calculi: Secondary | ICD-10-CM

## 2014-06-03 DIAGNOSIS — Z79899 Other long term (current) drug therapy: Secondary | ICD-10-CM

## 2014-06-03 DIAGNOSIS — B20 Human immunodeficiency virus [HIV] disease: Secondary | ICD-10-CM | POA: Diagnosis not present

## 2014-06-03 DIAGNOSIS — Z23 Encounter for immunization: Secondary | ICD-10-CM | POA: Diagnosis not present

## 2014-06-03 DIAGNOSIS — E781 Pure hyperglyceridemia: Secondary | ICD-10-CM | POA: Diagnosis not present

## 2014-06-03 DIAGNOSIS — K449 Diaphragmatic hernia without obstruction or gangrene: Secondary | ICD-10-CM

## 2014-06-03 LAB — POCT URINALYSIS DIPSTICK
Bilirubin, UA: NEGATIVE
Blood, UA: NEGATIVE
Glucose, UA: NEGATIVE
Ketones, UA: NEGATIVE
Leukocytes, UA: NEGATIVE
NITRITE UA: NEGATIVE
PH UA: 6
PROTEIN UA: NEGATIVE
Spec Grav, UA: >=1.03
Urobilinogen, UA: NEGATIVE

## 2014-06-03 LAB — LIPID PANEL
Cholesterol: 184 mg/dL (ref 0–200)
HDL: 34 mg/dL — ABNORMAL LOW (ref >=40)
LDL CALC: 109 mg/dL — AB (ref 0–99)
TRIGLYCERIDES: 204 mg/dL — AB (ref <150)
Total CHOL/HDL Ratio: 5.4 Ratio
VLDL: 41 mg/dL — ABNORMAL HIGH (ref 0–40)

## 2014-06-03 LAB — CBC WITH DIFFERENTIAL/PLATELET
BASOS PCT: 0 % (ref 0–1)
Basophils Absolute: 0 10*3/uL (ref 0.0–0.1)
Eosinophils Absolute: 0.2 10*3/uL (ref 0.0–0.7)
Eosinophils Relative: 2 % (ref 0–5)
HCT: 45.3 % (ref 39.0–52.0)
Hemoglobin: 15.8 g/dL (ref 13.0–17.0)
Lymphocytes Relative: 43 % (ref 12–46)
Lymphs Abs: 3.4 10*3/uL (ref 0.7–4.0)
MCH: 34.7 pg — ABNORMAL HIGH (ref 26.0–34.0)
MCHC: 34.9 g/dL (ref 30.0–36.0)
MCV: 99.6 fL (ref 78.0–100.0)
MONOS PCT: 11 % (ref 3–12)
MPV: 10.6 fL (ref 8.6–12.4)
Monocytes Absolute: 0.9 10*3/uL (ref 0.1–1.0)
NEUTROS PCT: 44 % (ref 43–77)
Neutro Abs: 3.5 10*3/uL (ref 1.7–7.7)
Platelets: 220 10*3/uL (ref 150–400)
RBC: 4.55 MIL/uL (ref 4.22–5.81)
RDW: 13.2 % (ref 11.5–15.5)
WBC: 7.9 10*3/uL (ref 4.0–10.5)

## 2014-06-03 LAB — COMPREHENSIVE METABOLIC PANEL
ALBUMIN: 4.4 g/dL (ref 3.5–5.2)
ALT: 41 U/L (ref 0–53)
AST: 19 U/L (ref 0–37)
Alkaline Phosphatase: 42 U/L (ref 39–117)
BUN: 19 mg/dL (ref 6–23)
CALCIUM: 9.4 mg/dL (ref 8.4–10.5)
CO2: 23 mEq/L (ref 19–32)
Chloride: 103 mEq/L (ref 96–112)
Creat: 1.28 mg/dL (ref 0.50–1.35)
GLUCOSE: 87 mg/dL (ref 70–99)
Potassium: 4.5 mEq/L (ref 3.5–5.3)
Sodium: 138 mEq/L (ref 135–145)
TOTAL PROTEIN: 7.1 g/dL (ref 6.0–8.3)
Total Bilirubin: 1.3 mg/dL — ABNORMAL HIGH (ref 0.2–1.2)

## 2014-06-03 MED ORDER — ELVITEG-COBIC-EMTRICIT-TENOFDF 150-150-200-300 MG PO TABS: 1.0000 | ORAL_TABLET | Freq: Every day | ORAL | Status: DC

## 2014-06-03 NOTE — Progress Notes (Signed)
Subjective:    Patient ID: Darryl Abide., male    DOB: [birth year], 49 y.o.   MRN: [id number]  HPI He is here for an annual examination. He is currently training for a half marathon and states he has been doing well. Good medication compliance with his current HIV medication regimen but he would like to switch to a combination drug in order to reduce the number of pills he takes on a daily basis.  He denies fever, chills, fatigue, chest pain, palpitations, cough, DOE, dysphagia, and GI or GU issues. His last sexual encounter was in November and he has not had any STD testing since then. He denies recent problems with kidney stones, no reflux or indigestion.  He is not a smoker, no alcohol, exercises regularly, and eats a healthy diet.  Medications, family and social history reviewed. He is now married. His work is going quite well. Immunizations and health maintenance reviewed. He received flu shot at work and last pneumonia shot was 2006.  He will need a colonoscopy this year and will schedule the appointment after he finishes his half marathon in May. He gets his eyes checked regularly, sees the dentist every 6 months. He wears his seatbelt and has smoke detectors in his home.   Review of Systems  All other systems reviewed and are negative.      Objective:   Physical Exam  BP 112/70 mmHg  Pulse 60  Ht 5\' 10"  (1.778 m)  Wt 185 lb (83.915 kg)  BMI 26.54 kg/m2  SpO2 99%  General Appearance:    Alert, cooperative, no distress, appears stated age  Head:    Normocephalic, without obvious abnormality, atraumatic  Eyes:    PERRL, conjunctiva/corneas clear, EOM's intact, fundi    benign, both eyes       Ears:    Normal TM's and external ear canals, both ears  Nose:   Nares normal, septum midline, mucosa normal, no drainage   or sinus tenderness  Throat:   Lips, mucosa, and tongue normal; teeth and gums normal  Neck:   Supple, symmetrical, trachea midline, no adenopathy;      thyroid:  No enlargement/tenderness/nodules  Back:     Symmetric, no curvature, ROM normal, no CVA tenderness  Lungs:     Clear to auscultation bilaterally, respirations unlabored  Chest wall:    No tenderness or deformity  Heart:    Regular rate and rhythm, S1 and S2 normal, no murmur, rub   or gallop  Abdomen:     Soft, non-tender, bowel sounds active all four quadrants,    no masses, no organomegaly  Genitalia:    Deferred  Rectal:    Deferred  Extremities:   Extremities normal, atraumatic, no cyanosis or edema  Pulses:   2+ and symmetric all extremities  Skin:   Skin color, texture, turgor normal, no rashes or lesions  Lymph nodes:   Cervical, supraclavicular, and axillary nodes normal  Neurologic:   CNII-XII intact. Normal strength, sensation and reflexes      throughout         Assessment & Plan:  Routine general medical examination at a health care facility - Plan: POCT Urinalysis Dipstick, CBC with Differential/Platelet, Comprehensive metabolic panel, Lipid panel, T-helper cells (CD4) count  HIV disease - Plan: elvitegravir-cobicistat-emtricitabine-tenofovir (STRIBILD) 150-150-200-300 MG TABS tablet, Pneumococcal conjugate vaccine 13-valent, HIV 1 RNA quant-no reflex-bld, T-helper cells (CD4) count, T-helper cells (CD4) count  Hypertriglyceridemia - Plan: Lipid panel  History of renal  stone  HH (hiatus hernia)  Gilbert's disease  Need for prophylactic vaccination against Streptococcus pneumoniae (pneumococcus) - Plan: Pneumococcal conjugate vaccine 13-valent  Encounter for long-term (current) use of other high-risk medications - Plan: CBC with Differential/Platelet, Comprehensive metabolic panel, Lipid panel  Contact with or exposure to communicable disease - Plan: GC/chlamydia probe amp, urine, RPR  Discontinue previous HIV meds and start Stribild. Follow up in 1 month and will re-check labs at that time.

## 2014-06-04 LAB — T-HELPER CELLS (CD4) COUNT (NOT AT ARMC)
Absolute CD4: 1155 /uL (ref 381–1469)
CD4 T HELPER %: 34 % (ref 32–62)
TOTAL LYMPHOCYTE COUNT: 3397 /uL — AB (ref 700–3300)
Total Lymphocyte: 43 % (ref 12–46)
WBC, LYMPH ENUMERATION: 7.9 10*3/uL (ref 4.0–10.5)

## 2014-06-04 LAB — HIV-1 RNA QUANT-NO REFLEX-BLD
HIV 1 RNA Quant: <20 copies/mL (ref <20)
HIV-1 RNA Quant, Log: <1.3 {Log} (ref <1.30)

## 2014-06-04 LAB — GC/CHLAMYDIA PROBE AMP, URINE
Chlamydia, Swab/Urine, PCR: NEGATIVE
GC PROBE AMP, URINE: NEGATIVE

## 2014-06-04 LAB — RPR

## 2014-07-03 ENCOUNTER — Emergency Department (HOSPITAL_COMMUNITY)
Admission: EM | Admit: 2014-07-03 | Discharge: 2014-07-03 | Disposition: A | Discharge status: Home / Self Care | Payer: 59 | Source: Home / Self Care | Attending: Family Medicine | Admitting: Family Medicine

## 2014-07-03 ENCOUNTER — Encounter (HOSPITAL_COMMUNITY): Payer: Self-pay | Admitting: Emergency Medicine

## 2014-07-03 DIAGNOSIS — M79662 Pain in left lower leg: Secondary | ICD-10-CM

## 2014-07-03 MED ORDER — XARELTO VTE STARTER PACK 15 & 20 MG PO TBPK: 15.0000 mg | ORAL_TABLET | ORAL | Status: DC

## 2014-07-03 NOTE — ED Notes (Signed)
C/o left leg/calf pain onset 5 days Reports he ran a marathon on Sunday; just arrived from a trip via airplane on Tuesday Pain is constant  Alert, no signs of acute distress.

## 2014-07-03 NOTE — Discharge Instructions (Signed)
We'll set up the venous doppler tomorrow.  Get the [place] started tonight to prevent clot formation

## 2014-07-03 NOTE — ED Provider Notes (Signed)
CSN: [id number]     Arrival date & time 07/03/14  1745 History   First MD Initiated Contact with Patient 07/03/14 1933     Chief Complaint  Patient presents with  . Leg Pain   (Consider location/radiation/quality/duration/timing/severity/associated sxs/prior Treatment) Patient is a 49 y.o. male presenting with leg pain. The history is provided by the patient.  Leg Pain Location:  Leg Leg location:  L leg Pain details:    Quality:  Aching and dull   Radiates to:  Does not radiate   Severity:  Moderate   Onset quality:  Gradual   Timing:  Constant   Progression:  Unchanged Chronicity:  New Dislocation: no   Foreign body present:  No foreign bodies Tetanus status:  Up to date Prior injury to area:  No Relieved by:  Nothing Worsened by:  Nothing tried Associated symptoms: no back pain and no neck pain   Risk factors: no known bone disorder and no recent illness   Patient ran 1/2 marathon 5 days ago and flew back from [place] on Tuesday.  He developed lower left calf pain after the flight without swelling which has continued.  Past Medical History  Diagnosis Date  . HIV positive 89  . Renal stone   . Dyslipidemia   . HH (hiatus hernia)   . Gilbert's disease   . GERD (gastroesophageal reflux disease)   . Hyperlipidemia    Past Surgical History  Procedure Laterality Date  . Appendectomy    . Upper gastrointestinal endoscopy     Family History  Problem Relation Age of Onset  . Pancreatic cancer Mother   . Diabetes Father   . Heart disease Maternal Grandfather   . Heart disease Paternal Grandfather    History  Substance Use Topics  . Smoking status: Never Smoker   . Smokeless tobacco: Never Used  . Alcohol Use: No    Review of Systems  Constitutional: Negative.   Eyes: Negative.   Respiratory: Negative.   Cardiovascular: Negative.   Endocrine: Negative.   Genitourinary: Negative.   Musculoskeletal: Positive for myalgias. Negative for back pain, joint  swelling, arthralgias, neck pain and neck stiffness.  Skin: Negative.     Allergies  Review of patient's allergies indicates no known allergies.  Home Medications   Prior to Admission medications   Medication Sig Start Date End Date Taking? Authorizing Provider  elvitegravir-cobicistat-emtricitabine-tenofovir (STRIBILD) 150-150-200-300 MG TABS tablet Take 1 tablet by mouth daily with breakfast. 06/03/14  Yes Denita Lung, MD  fenofibrate 160 MG tablet TAKE 1 TABLET BY MOUTH ONCE DAILY 04/16/14  Yes Denita Lung, MD  Multiple Vitamin (MULTIVITAMIN WITH MINERALS) TABS Take 1 tablet by mouth daily.   Yes Historical Provider, MD  omega-3 acid ethyl esters (LOVAZA) 1 G capsule Take 2 capsules (2 g total) by mouth 2 (two) times daily. 04/29/13  Yes Denita Lung, MD  cholecalciferol (VITAMIN D) 1000 UNITS tablet Take 1,000 Units by mouth daily.    Historical Provider, MD  XARELTO STARTER PACK 15 & 20 MG TBPK Take 15-20 mg by mouth as directed. Take as directed on package: Start with one 15mg  tablet by mouth twice a day with food. On Day 22, switch to one 20mg  tablet once a day with food. 07/03/14   Robyn Haber, MD  zolpidem (AMBIEN) 10 MG tablet Take 0.5-1 tablets (5-10 mg total) by mouth at bedtime as needed for sleep. 02/13/14 06/03/14  Denita Lung, MD   BP 132/82 mmHg  Pulse  57  Temp(Src) 98.7 F (37.1 C) (Oral)  Resp 14  SpO2 100% Physical Exam  Constitutional: He appears well-developed and well-nourished.  HENT:  Head: Normocephalic and atraumatic.  Eyes: EOM are normal.  Neck: Normal range of motion. Neck supple.  Cardiovascular: Normal rate, regular rhythm and normal heart sounds.   Pulmonary/Chest: Effort normal and breath sounds normal.  Musculoskeletal: Normal range of motion.  Tender left calf just above the Achilles tendon.  No STS or ecchymosis  Skin: Skin is warm.  Psychiatric: He has a normal mood and affect. His behavior is normal.    ED Course  Procedures  (including critical care time) Labs Review Labs Reviewed - No data to display  Imaging Review No results found.   MDM   1. Calf pain, left    Calf pain, left - Plan: XARELTO STARTER PACK 15 & 20 MG TBPK      Robyn Haber, MD 07/03/14 2004

## 2014-07-04 ENCOUNTER — Ambulatory Visit (HOSPITAL_BASED_OUTPATIENT_CLINIC_OR_DEPARTMENT_OTHER)
Admission: RE | Admit: 2014-07-04 | Discharge: 2014-07-04 | Disposition: A | Discharge status: Home / Self Care | Payer: 59 | Source: Ambulatory Visit | Attending: Emergency Medicine | Admitting: Emergency Medicine

## 2014-07-04 ENCOUNTER — Telehealth (HOSPITAL_COMMUNITY): Payer: Self-pay

## 2014-07-04 ENCOUNTER — Other Ambulatory Visit (HOSPITAL_COMMUNITY): Payer: Self-pay | Admitting: Emergency Medicine

## 2014-07-04 ENCOUNTER — Ambulatory Visit (HOSPITAL_COMMUNITY)
Admission: EM | Admit: 2014-07-04 | Discharge: 2014-07-04 | Disposition: A | Discharge status: Home / Self Care | Payer: 59 | Source: Other Acute Inpatient Hospital | Attending: Emergency Medicine | Admitting: Emergency Medicine

## 2014-07-04 ENCOUNTER — Encounter (HOSPITAL_COMMUNITY): Payer: Self-pay | Admitting: Emergency Medicine

## 2014-07-04 ENCOUNTER — Emergency Department (HOSPITAL_COMMUNITY)
Admission: EM | Admit: 2014-07-04 | Discharge: 2014-07-04 | Disposition: A | Discharge status: Home / Self Care | Payer: 59 | Attending: Emergency Medicine | Admitting: Emergency Medicine

## 2014-07-04 ENCOUNTER — Ambulatory Visit (HOSPITAL_COMMUNITY)
Admission: RE | Admit: 2014-07-04 | Discharge: 2014-07-04 | Disposition: A | Discharge status: Home / Self Care | Payer: 59 | Source: Ambulatory Visit | Admitting: Emergency Medicine

## 2014-07-04 DIAGNOSIS — Z87442 Personal history of urinary calculi: Secondary | ICD-10-CM | POA: Insufficient documentation

## 2014-07-04 DIAGNOSIS — M79662 Pain in left lower leg: Secondary | ICD-10-CM | POA: Diagnosis present

## 2014-07-04 DIAGNOSIS — I82442 Acute embolism and thrombosis of left tibial vein: Secondary | ICD-10-CM | POA: Insufficient documentation

## 2014-07-04 DIAGNOSIS — E785 Hyperlipidemia, unspecified: Secondary | ICD-10-CM | POA: Diagnosis not present

## 2014-07-04 DIAGNOSIS — I824Z2 Acute embolism and thrombosis of unspecified deep veins of left distal lower extremity: Secondary | ICD-10-CM | POA: Insufficient documentation

## 2014-07-04 DIAGNOSIS — I82402 Acute embolism and thrombosis of unspecified deep veins of left lower extremity: Secondary | ICD-10-CM

## 2014-07-04 DIAGNOSIS — R52 Pain, unspecified: Secondary | ICD-10-CM | POA: Diagnosis not present

## 2014-07-04 DIAGNOSIS — Z79899 Other long term (current) drug therapy: Secondary | ICD-10-CM | POA: Insufficient documentation

## 2014-07-04 DIAGNOSIS — Z8719 Personal history of other diseases of the digestive system: Secondary | ICD-10-CM | POA: Diagnosis not present

## 2014-07-04 DIAGNOSIS — Z21 Asymptomatic human immunodeficiency virus [HIV] infection status: Secondary | ICD-10-CM | POA: Diagnosis not present

## 2014-07-04 NOTE — ED Provider Notes (Signed)
CSN: [id number]     Arrival date & time 07/04/14  1007 History   First MD Initiated Contact with Patient 07/04/14 1012     Chief Complaint  Patient presents with  . DVT    possible left leg     (Consider location/radiation/quality/duration/timing/severity/associated sxs/prior Treatment) HPI Comments: Patient presents today with a chief complaint of left calf pain.  He states that he ran a Marathon six days ago in [place].  He then flew back to [facility] from [place] four days ago.  He states that he had some soreness of both calves initially after the [place].  However, the soreness of the right calf resolved and the pain of the left calf worsened three days ago.  He states that he has not noticed any obvious swelling or erythema of the calf.  He was seen by Urgent Care for this last night and the physician who saw him was concerned about a DVT.  He was given Rx for Xarelto at that time and sent to the ED to obtain a Doppler Ultrasound to rule DVT.  He denies SOB, chest pain, fever, or chills.  He denies history of DVT or PE.    The history is provided by the patient.    Past Medical History  Diagnosis Date  . HIV positive 89  . Renal stone   . Dyslipidemia   . HH (hiatus hernia)   . Gilbert's disease   . GERD (gastroesophageal reflux disease)   . Hyperlipidemia    Past Surgical History  Procedure Laterality Date  . Appendectomy    . Upper gastrointestinal endoscopy     Family History  Problem Relation Age of Onset  . Pancreatic cancer Mother   . Diabetes Father   . Heart disease Maternal Grandfather   . Heart disease Paternal Grandfather    History  Substance Use Topics  . Smoking status: Never Smoker   . Smokeless tobacco: Never Used  . Alcohol Use: No    Review of Systems  All other systems reviewed and are negative.     Allergies  Review of patient's allergies indicates no known allergies.  Home Medications   Prior to Admission medications   Medication Sig  Start Date End Date Taking? Authorizing Provider  cholecalciferol (VITAMIN D) 1000 UNITS tablet Take 1,000 Units by mouth daily.    Historical Provider, MD  elvitegravir-cobicistat-emtricitabine-tenofovir (STRIBILD) 150-150-200-300 MG TABS tablet Take 1 tablet by mouth daily with breakfast. 06/03/14   Denita Lung, MD  fenofibrate 160 MG tablet TAKE 1 TABLET BY MOUTH ONCE DAILY 04/16/14   Denita Lung, MD  Multiple Vitamin (MULTIVITAMIN WITH MINERALS) TABS Take 1 tablet by mouth daily.    Historical Provider, MD  omega-3 acid ethyl esters (LOVAZA) 1 G capsule Take 2 capsules (2 g total) by mouth 2 (two) times daily. 04/29/13   Denita Lung, MD  XARELTO STARTER PACK 15 & 20 MG TBPK Take 15-20 mg by mouth as directed. Take as directed on package: Start with one 15mg  tablet by mouth twice a day with food. On Day 22, switch to one 20mg  tablet once a day with food. 07/03/14   Robyn Haber, MD  zolpidem (AMBIEN) 10 MG tablet Take 0.5-1 tablets (5-10 mg total) by mouth at bedtime as needed for sleep. 02/13/14 06/03/14  Denita Lung, MD   There were no vitals taken for this visit. Physical Exam  Constitutional: He appears well-developed and well-nourished.  HENT:  Head: Normocephalic and atraumatic.  Neck: Normal range of motion. Neck supple.  Cardiovascular: Normal rate, regular rhythm and normal heart sounds.   Pulses:      Dorsalis pedis pulses are 2+ on the right side, and 2+ on the left side.  Pulmonary/Chest: Effort normal and breath sounds normal.  Musculoskeletal: Normal range of motion.       Left knee: He exhibits normal range of motion and no swelling.       Left ankle: He exhibits normal range of motion and no swelling.  Positive Homan's sign of the left leg No erythema or warmth of the left leg Tenderness to palpation of the left calf     Neurological: He is alert.  Distal sensation of the left foot intact  Skin: Skin is warm and dry. No erythema.  Nursing note and vitals  reviewed.   ED Course  Procedures (including critical care time) Labs Review Labs Reviewed - No data to display  Imaging Review No results found.   EKG Interpretation None      MDM   Final diagnoses:  None   Patient presents today to obtain a Doppler Ultrasound to rule out a DVT.  Ultrasound positive for DVT.  Patient has a Rx for Xarelto that he was given by physician at Urgent Care that saw him last evening.  VSS.  No hypoxia or tachycardia.  He denies CP or SOB.  Feel that the patient is stable for discharge.  Patient instructed to take the Xarelto as directed.  Return precautions given.    Hyman Bible, PA-C 07/04/14 [place], MD 07/05/14 [phone]

## 2014-07-04 NOTE — ED Notes (Addendum)
Per [facility] notes and pt: Patient ran 1/2 marathon 5 days ago and flew back from [place] on Tuesday. He developed lower left calf pain after the flight without swelling which has continued. Pt denies chest pain or SOB. Pt was prescribed Xarelto and took first dose last night.

## 2014-07-04 NOTE — Discharge Instructions (Signed)
The ultrasound today shows a blood clot.  It is important for you to take the Xarelto as directed.  Return to the Emergency Department immediately if you develop chest pain or shortness of breath. Deep Vein Thrombosis A deep vein thrombosis (DVT) is a blood clot that develops in the deep, larger veins of the leg, arm, or pelvis. These are more dangerous than clots that might form in veins near the surface of the body. A DVT can lead to serious and even life-threatening complications if the clot breaks off and travels in the bloodstream to the lungs.  A DVT can damage the valves in your leg veins so that instead of flowing upward, the blood pools in the lower leg. This is called post-thrombotic syndrome, and it can result in pain, swelling, discoloration, and sores on the leg. CAUSES Usually, several things contribute to the formation of blood clots. Contributing factors include:  The flow of blood slows down.  The inside of the vein is damaged in some way.  You have a condition that makes blood clot more easily. RISK FACTORS Some people are more likely than others to develop blood clots. Risk factors include:   Smoking.  Being overweight (obese).  Sitting or lying still for a long time. This includes long-distance travel, paralysis, or recovery from an illness or surgery. Other factors that increase risk are:   Older age, especially over 21 years of age.  Having a family history of blood clots or if you have already had a blot clot.  Having major or lengthy surgery. This is especially true for surgery on the hip, knee, or belly (abdomen). Hip surgery is particularly high risk.  Having a long, thin tube (catheter) placed inside a vein during a medical procedure.  Breaking a hip or leg.  Having cancer or cancer treatment.  Pregnancy and childbirth.  Hormone changes make the blood clot more easily during pregnancy.  The fetus puts pressure on the veins of the pelvis.  There is a  risk of injury to veins during delivery or a caesarean delivery. The risk is highest just after childbirth.  Medicines containing the male hormone estrogen. This includes birth control pills and hormone replacement therapy.  Other circulation or heart problems.  SIGNS AND SYMPTOMS When a clot forms, it can either partially or totally block the blood flow in that vein. Symptoms of a DVT can include:  Swelling of the leg or arm, especially if one side is much worse.  Warmth and redness of the leg or arm, especially if one side is much worse.  Pain in an arm or leg. If the clot is in the leg, symptoms may be more noticeable or worse when standing or walking. The symptoms of a DVT that has traveled to the lungs (pulmonary embolism, PE) usually start suddenly and include:  Shortness of breath.  Coughing.  Coughing up blood or blood-tinged mucus.  Chest pain. The chest pain is often worse with deep breaths.  Rapid heartbeat. Anyone with these symptoms should get emergency medical treatment right away. Do not wait to see if the symptoms will go away. Call your local emergency services (911 in the [place] if you have these symptoms. Do not drive yourself to the hospital. DIAGNOSIS If a DVT is suspected, your health care provider will take a full medical history and perform a physical exam. Tests that also may be required include:  Blood tests, including studies of the clotting properties of the blood.  Ultrasound to  see if you have clots in your legs or lungs.  X-rays to show the flow of blood when dye is injected into the veins (venogram).  Studies of your lungs if you have any chest symptoms. PREVENTION  Exercise the legs regularly. Take a brisk 30-minute walk every day.  Maintain a weight that is appropriate for your height.  Avoid sitting or lying in bed for long periods of time without moving your legs.  Women, particularly those over the age of 8 years, should consider the  risks and benefits of taking estrogen medicines, including birth control pills.  Do not smoke, especially if you take estrogen medicines.  Long-distance travel can increase your risk of DVT. You should exercise your legs by walking or pumping the muscles every hour.  Many of the risk factors above relate to situations that exist with hospitalization, either for illness, injury, or elective surgery. Prevention may include medical and nonmedical measures.  Your health care provider will assess you for the need for venous thromboembolism prevention when you are admitted to the hospital. If you are having surgery, your surgeon will assess you the day of or day after surgery. TREATMENT Once identified, a DVT can be treated. It can also be prevented in some circumstances. Once you have had a DVT, you may be at increased risk for a DVT in the future. The most common treatment for DVT is blood-thinning (anticoagulant) medicine, which reduces the blood's tendency to clot. Anticoagulants can stop new blood clots from forming and stop old clots from growing. They cannot dissolve existing clots. Your body does this by itself over time. Anticoagulants can be given by mouth, through an IV tube, or by injection. Your health care provider will determine the best program for you. Other medicines or treatments that may be used are:  Heparin or related medicines (low molecular weight heparin) are often the first treatment for a blood clot. They act quickly. However, they cannot be taken orally and must be given either in shot form or by IV tube.  Heparin can cause a fall in a component of blood that stops bleeding and forms blood clots (platelets). You will be monitored with blood tests to be sure this does not occur.  Warfarin is an anticoagulant that can be swallowed. It takes a few days to start working, so usually heparin or related medicines are used in combination. Once warfarin is working, heparin is usually  stopped.  Factor Xa inhibitor medicines, such as rivaroxaban and apixaban, also reduce blood clotting. These medicines are taken orally and can often be used without heparin or related medicines.  Less commonly, clot dissolving drugs (thrombolytics) are used to dissolve a DVT. They carry a high risk of bleeding, so they are used mainly in severe cases where your life or a part of your body is threatened.  Very rarely, a blood clot in the leg needs to be removed surgically.  If you are unable to take anticoagulants, your health care provider may arrange for you to have a filter placed in a main vein in your abdomen. This filter prevents clots from traveling to your lungs. HOME CARE INSTRUCTIONS  Take all medicines as directed by your health care provider.  Learn as much as you can about DVT.  Wear a medical alert bracelet or carry a medical alert card.  Ask your health care provider how soon you can go back to normal activities. It is important to stay active to prevent blood clots. If you  are on anticoagulant medicine, avoid contact sports.  It is very important to exercise. This is especially important while traveling, sitting, or standing for long periods of time. Exercise your legs by walking or by tightening and relaxing your leg muscles regularly. Take frequent walks.  You may need to wear compression stockings. These are tight elastic stockings that apply pressure to the lower legs. This pressure can help keep the blood in the legs from clotting. Taking Warfarin Warfarin is a daily medicine that is taken by mouth. Your health care provider will advise you on the length of treatment (usually 3-6 months, sometimes lifelong). If you take warfarin:  Understand how to take warfarin and foods that can affect how warfarin works in Veterinary surgeon.  Too much and too little warfarin are both dangerous. Too much warfarin increases the risk of bleeding. Too little warfarin continues to allow the risk  for blood clots. Warfarin and Regular Blood Testing While taking warfarin, you will need to have regular blood tests to measure your blood clotting time. These blood tests usually include both the prothrombin time (PT) and international normalized ratio (INR) tests. The PT and INR results allow your health care provider to adjust your dose of warfarin. It is very important that you have your PT and INR tested as often as directed by your health care provider.  Warfarin and Your Diet Avoid major changes in your diet, or notify your health care provider before changing your diet. Arrange a visit with a registered dietitian to answer your questions. Many foods, especially foods high in vitamin K, can interfere with warfarin and affect the PT and INR results. You should eat a consistent amount of foods high in vitamin K. Foods high in vitamin K include:   Spinach, kale, broccoli, cabbage, collard and turnip greens, Brussels sprouts, peas, cauliflower, seaweed, and parsley.  Beef and pork liver.  Green tea.  Soybean oil. Warfarin with Other Medicines Many medicines can interfere with warfarin and affect the PT and INR results. You must:  Tell your health care provider about any and all medicines, vitamins, and supplements you take, including aspirin and other over-the-counter anti-inflammatory medicines. Be especially cautious with aspirin and anti-inflammatory medicines. Ask your health care provider before taking these.  Do not take or discontinue any prescribed or over-the-counter medicine except on the advice of your health care provider or pharmacist. Warfarin Side Effects Warfarin can have side effects, such as easy bruising and difficulty stopping bleeding. Ask your health care provider or pharmacist about other side effects of warfarin. You will need to:  Hold pressure over cuts for longer than usual.  Notify your dentist and other health care providers that you are taking warfarin  before you undergo any procedures where bleeding may occur. Warfarin with Alcohol and Tobacco   Drinking alcohol frequently can increase the effect of warfarin, leading to excess bleeding. It is best to avoid alcoholic drinks or to consume only very small amounts while taking warfarin. Notify your health care provider if you change your alcohol intake.   Do not use any tobacco products including cigarettes, chewing tobacco, or electronic cigarettes. If you smoke, quit. Ask your health care provider for help with quitting smoking. Alternative Medicines to Warfarin: Factor Xa Inhibitor Medicines  These blood-thinning medicines are taken by mouth, usually for several weeks or longer. It is important to take the medicine every single day at the same time each day.  There are no regular blood tests required when using  these medicines.  There are fewer food and drug interactions than with warfarin.  The side effects of this class of medicine are similar to those of warfarin, including excessive bruising or bleeding. Ask your health care provider or pharmacist about other potential side effects. SEEK MEDICAL CARE IF:  You notice a rapid heartbeat.  You feel weaker or more tired than usual.  You feel faint.  You notice increased bruising.  You feel your symptoms are not getting better in the time expected.  You believe you are having side effects of medicine. SEEK IMMEDIATE MEDICAL CARE IF:  You have chest pain.  You have trouble breathing.  You have new or increased swelling or pain in one leg.  You cough up blood.  You notice blood in vomit, in a bowel movement, or in urine. MAKE SURE YOU:  Understand these instructions.  Will watch your condition.  Will get help right away if you are not doing well or get worse. Document Released: 02/13/2005 Document Revised: 06/30/2013 Document Reviewed: 10/21/2012 [facility] Patient Information 2015 [place], [place]. This information is not  intended to replace advice given to you by your health care provider. Make sure you discuss any questions you have with your health care provider.

## 2014-07-04 NOTE — Progress Notes (Addendum)
VASCULAR LAB PRELIMINARY  PRELIMINARY  PRELIMINARY  PRELIMINARY  Left lower extremity venous Doppler completed.    Preliminary report:  There is acute, partially occlusive DVT noted in the left posterior tibial vein to mid calf.  There is also extreme sluggish flow and dilatation noted in the left popliteal and femoral veins, indicating possible impending development of DVT in those veins, as well.  Mannix Kroeker, RVT 07/04/2014, 10:13 AM

## 2014-07-04 NOTE — ED Notes (Signed)
Per Dr Pauletta Browns request patient called and told to go to [facility] or [facility] ER for venous doppler since we are unable to order them here.  I called patient he stated he was at the [facility] ER now and someone there is going to help him.

## 2014-07-09 ENCOUNTER — Encounter: Payer: Self-pay | Admitting: Family Medicine

## 2014-07-09 ENCOUNTER — Telehealth: Payer: Self-pay | Admitting: Family Medicine

## 2014-07-09 ENCOUNTER — Ambulatory Visit (INDEPENDENT_AMBULATORY_CARE_PROVIDER_SITE_OTHER): Payer: 59 | Admitting: Family Medicine

## 2014-07-09 VITALS — BP 122/80 | HR 66 | Wt 181.8 lb

## 2014-07-09 DIAGNOSIS — B20 Human immunodeficiency virus [HIV] disease: Secondary | ICD-10-CM

## 2014-07-09 DIAGNOSIS — J069 Acute upper respiratory infection, unspecified: Secondary | ICD-10-CM | POA: Diagnosis not present

## 2014-07-09 DIAGNOSIS — I82402 Acute embolism and thrombosis of unspecified deep veins of left lower extremity: Secondary | ICD-10-CM

## 2014-07-09 MED ORDER — RIVAROXABAN 20 MG PO TABS: 20.0000 mg | ORAL_TABLET | Freq: Every day | ORAL | Status: DC

## 2014-07-09 NOTE — Telephone Encounter (Signed)
Dr. Redmond School spoke with pharmacy

## 2014-07-09 NOTE — Progress Notes (Signed)
   Subjective:    Patient ID: Darryl Black., male    DOB: 06/28/1965, 49 y.o.   MRN: [id number]  HPI    Review of Systems     Objective:   Physical Exam        Assessment & Plan:  Discussion with pharmacist indicates his HIV meds can increase the effectiveness of XARELTO. We will stop that medication and switch him to Eliquis 2-1/2 mg twice a day. Pharmacist will discuss this with the patient.

## 2014-07-09 NOTE — Telephone Encounter (Signed)
[place] state that Xarelto med interfers with HIV meds. HIV meds can increase levels of Xarelto so pharmacy wants to know if pt should ge this med or change to something else

## 2014-07-09 NOTE — Telephone Encounter (Signed)
Lets cut him down to 15 mg a day instead of 20 mg

## 2014-07-09 NOTE — Progress Notes (Signed)
   Subjective:    Patient ID: Darryl Abide., male    DOB: 1965/12/22, 49 y.o.   MRN: [id number]  HPI He is here for a recheck. He recently was diagnosed with DVT. Prior to this he did participate in the half marathon and the next day came home on at transcontinental flight. He then developed pain and was diagnosed with DVT. Presently he is on twice a day dosing of XARELTO 15 mg. He also has nasal congestion, slight throat and dry cough. He was also placed on stride build one month ago.   Review of Systems     Objective:   Physical Exam Alert and in no distress. Tympanic membranes and canals are normal. Pharyngeal area is normal. Neck is supple without adenopathy or thyromegaly. Cardiac exam shows a regular sinus rhythm without murmurs or gallops. Lungs are clear to auscultation.Homans sign is negative.        Assessment & Plan:  DVT (deep venous thrombosis), left - Plan: rivaroxaban (XARELTO) 20 MG TABS tablet  HIV disease - Plan: HIV 1 RNA quant-no reflex-bld, CANCELED: HIV 1 RNA quant-no reflex-bld  Acute URI He will continue on XARELTO going to 20 mg to complete 3 months. I will then discuss possible follow-up with Doppler studies. I will wait to check his viral load since he has URI. Supportive care for the URI.

## 2014-07-10 NOTE — Telephone Encounter (Signed)
I informed the patient of the reason for the switch to Eliquis

## 2014-07-12 ENCOUNTER — Ambulatory Visit (INDEPENDENT_AMBULATORY_CARE_PROVIDER_SITE_OTHER): Payer: 59 | Admitting: Physician Assistant

## 2014-07-12 ENCOUNTER — Other Ambulatory Visit: Payer: Self-pay | Admitting: Physician Assistant

## 2014-07-12 VITALS — BP 130/86 | HR 61 | Temp 98.3°F | Resp 16 | Ht 69.5 in | Wt 176.8 lb

## 2014-07-12 DIAGNOSIS — J069 Acute upper respiratory infection, unspecified: Secondary | ICD-10-CM | POA: Diagnosis not present

## 2014-07-12 DIAGNOSIS — J029 Acute pharyngitis, unspecified: Secondary | ICD-10-CM | POA: Diagnosis not present

## 2014-07-12 LAB — POCT RAPID STREP A (OFFICE): Rapid Strep A Screen: NEGATIVE

## 2014-07-12 MED ORDER — BENZONATATE 100 MG PO CAPS: 100.0000 mg | ORAL_CAPSULE | Freq: Three times a day (TID) | ORAL | Status: DC | PRN

## 2014-07-12 MED ORDER — FIRST-DUKES MOUTHWASH MT SUSP: 5.0000 mL | OROMUCOSAL | Status: DC | PRN

## 2014-07-12 MED ORDER — IPRATROPIUM BROMIDE 0.03 % NA SOLN: 2.0000 | Freq: Two times a day (BID) | NASAL | Status: DC

## 2014-07-12 NOTE — Progress Notes (Signed)
Subjective:    Patient ID: Darryl Abide., male    DOB: 1965-03-13, 49 y.o.   MRN: [id number]  HPI Patient presents for sore throat that has been present for 4 days. Husband has similar sx as well of congestion, productive cough, rhinorrhea, myalgias, and decreased appetite. Denies fever, N/V, sinus/ear pressure, HA, or SOB/CP. Has tried sudafed without relief and ibuprofen with some relief. No known sick contacts. No h/o asthma or allergies. NKDA.   Recent CD4 counts within range.    Review of Systems  Constitutional: Positive for appetite change. Negative for fever, chills and fatigue.  HENT: Positive for congestion, rhinorrhea and sore throat. Negative for ear discharge, ear pain, postnasal drip, sinus pressure, sneezing and trouble swallowing.   Eyes: Negative.   Respiratory: Positive for cough. Negative for shortness of breath and wheezing.   Cardiovascular: Negative for chest pain.  Gastrointestinal: Negative for nausea, vomiting and diarrhea.  Musculoskeletal: Negative for neck pain.  Neurological: Negative for dizziness and headaches.       Objective:   Physical Exam  Constitutional: He is oriented to person, place, and time. He appears well-developed and well-nourished. No distress.  Blood pressure 130/86, pulse 61, temperature 98.3 F (36.8 C), temperature source Oral, resp. rate 16, height 5' 9.5" (1.765 m), weight 176 lb 12.8 oz (80.196 kg), SpO2 98 %.  HENT:  Head: Normocephalic and atraumatic.  Right Ear: Tympanic membrane, external ear and ear canal normal.  Left Ear: Tympanic membrane, external ear and ear canal normal.  Nose: Rhinorrhea (with marginal erythema) present. No mucosal edema or sinus tenderness. Right sinus exhibits no maxillary sinus tenderness and no frontal sinus tenderness. Left sinus exhibits no maxillary sinus tenderness and no frontal sinus tenderness.  Mouth/Throat: Uvula is midline and mucous membranes are normal. Posterior oropharyngeal  erythema (marginal) present. No oropharyngeal exudate or posterior oropharyngeal edema.  Eyes: Conjunctivae are normal. Pupils are equal, round, and reactive to light. Right eye exhibits no discharge. Left eye exhibits no discharge. No scleral icterus.  Neck: Normal range of motion. Neck supple. No thyromegaly present.  Cardiovascular: Normal rate, regular rhythm and normal heart sounds.  Exam reveals no gallop and no friction rub.   No murmur heard. Pulmonary/Chest: Effort normal and breath sounds normal. No respiratory distress. He has no wheezes. He has no rales.  Abdominal: Soft. Bowel sounds are normal. He exhibits no distension. There is no tenderness. There is no rebound and no guarding.  Lymphadenopathy:    He has no cervical adenopathy.  Neurological: He is alert and oriented to person, place, and time.  Skin: Skin is warm and dry. No rash noted. He is not diaphoretic. No erythema. No pallor.   Results for orders placed or performed in visit on 07/12/14  POCT rapid strep A  Result Value Ref Range   Rapid Strep A Screen Negative Negative      Assessment & Plan:  1. Acute upper respiratory infection 2. Sore throat Plenty of fluid. - POCT rapid strep A - Culture, Group A Strep - ipratropium (ATROVENT) 0.03 % nasal spray; Place 2 sprays into both nostrils 2 (two) times daily.  Dispense: 30 mL; Refill: 0 - benzonatate (TESSALON) 100 MG capsule; Take 1-2 capsules (100-200 mg total) by mouth 3 (three) times daily as needed for cough.  Dispense: 40 capsule; Refill: 0 - Diphenhyd-Hydrocort-Nystatin (FIRST-DUKES MOUTHWASH) SUSP; Use as directed 5 mLs in the mouth or throat every 2 (two) hours as needed. Mix with 2% visc. [place],  1:1 ratio  Dispense: 120 mL; Refill: 0   Annaleigha Woo PA-C  [facility] 07/12/2014 3:59 PM

## 2014-07-12 NOTE — Patient Instructions (Addendum)
Upper Respiratory Infection, Adult An upper respiratory infection (URI) is also sometimes known as the common cold. The upper respiratory tract includes the nose, sinuses, throat, trachea, and bronchi. Bronchi are the airways leading to the lungs. Most people improve within 1 week, but symptoms can last up to 2 weeks. A residual cough may last even longer.  CAUSES Many different viruses can infect the tissues lining the upper respiratory tract. The tissues become irritated and inflamed and often become very moist. Mucus production is also common. A cold is contagious. You can easily spread the virus to others by oral contact. This includes kissing, sharing a glass, coughing, or sneezing. Touching your mouth or nose and then touching a surface, which is then touched by another person, can also spread the virus. SYMPTOMS  Symptoms typically develop 1 to 3 days after you come in contact with a cold virus. Symptoms vary from person to person. They may include:  Runny nose.  Sneezing.  Nasal congestion.  Sinus irritation.  Sore throat.  Loss of voice (laryngitis).  Cough.  Fatigue.  Muscle aches.  Loss of appetite.  Headache.  Low-grade fever. DIAGNOSIS  You might diagnose your own cold based on familiar symptoms, since most people get a cold 2 to 3 times a year. Your caregiver can confirm this based on your exam. Most importantly, your caregiver can check that your symptoms are not due to another disease such as strep throat, sinusitis, pneumonia, asthma, or epiglottitis. Blood tests, throat tests, and X-rays are not necessary to diagnose a common cold, but they may sometimes be helpful in excluding other more serious diseases. Your caregiver will decide if any further tests are required. RISKS AND COMPLICATIONS  You may be at risk for a more severe case of the common cold if you smoke cigarettes, have chronic heart disease (such as heart failure) or lung disease (such as asthma), or if  you have a weakened immune system. The very young and very old are also at risk for more serious infections. Bacterial sinusitis, middle ear infections, and bacterial pneumonia can complicate the common cold. The common cold can worsen asthma and chronic obstructive pulmonary disease (COPD). Sometimes, these complications can require emergency medical care and may be life-threatening. PREVENTION  The best way to protect against getting a cold is to practice good hygiene. Avoid oral or hand contact with people with cold symptoms. Wash your hands often if contact occurs. There is no clear evidence that vitamin C, vitamin E, echinacea, or exercise reduces the chance of developing a cold. However, it is always recommended to get plenty of rest and practice good nutrition. TREATMENT  Treatment is directed at relieving symptoms. There is no cure. Antibiotics are not effective, because the infection is caused by a virus, not by bacteria. Treatment may include:  Increased fluid intake. Sports drinks offer valuable electrolytes, sugars, and fluids.  Breathing heated mist or steam (vaporizer or shower).  Eating chicken soup or other clear broths, and maintaining good nutrition.  Getting plenty of rest.  Using gargles or lozenges for comfort.  Controlling fevers with ibuprofen or acetaminophen as directed by your caregiver.  Increasing usage of your inhaler if you have asthma. Zinc gel and zinc lozenges, taken in the first 24 hours of the common cold, can shorten the duration and lessen the severity of symptoms. Pain medicines may help with fever, muscle aches, and throat pain. A variety of non-prescription medicines are available to treat congestion and runny nose. Your caregiver   can make recommendations and may suggest nasal or lung inhalers for other symptoms.  HOME CARE INSTRUCTIONS   Only take over-the-counter or prescription medicines for pain, discomfort, or fever as directed by your  caregiver.  Use a warm mist humidifier or inhale steam from a shower to increase air moisture. This may keep secretions moist and make it easier to breathe.  Drink enough water and fluids to keep your urine clear or pale yellow.  Rest as needed.  Return to work when your temperature has returned to normal or as your caregiver advises. You may need to stay home longer to avoid infecting others. You can also use a face mask and careful hand washing to prevent spread of the virus. SEEK MEDICAL CARE IF:   After the first few days, you feel you are getting worse rather than better.  You need your caregiver's advice about medicines to control symptoms.  You develop chills, worsening shortness of breath, or brown or red sputum. These may be signs of pneumonia.  You develop yellow or brown nasal discharge or pain in the face, especially when you bend forward. These may be signs of sinusitis.  You develop a fever, swollen neck glands, pain with swallowing, or white areas in the back of your throat. These may be signs of strep throat. SEEK IMMEDIATE MEDICAL CARE IF:   You have a fever.  You develop severe or persistent headache, ear pain, sinus pain, or chest pain.  You develop wheezing, a prolonged cough, cough up blood, or have a change in your usual mucus (if you have chronic lung disease).  You develop sore muscles or a stiff neck. Document Released: 08/09/2000 Document Revised: 05/08/2011 Document Reviewed: 05/21/2013 ExitCare Patient Information 2015 [employer]. This information is not intended to replace advice given to you by your health care provider. Make sure you discuss any questions you have with your health care provider.  

## 2014-07-13 ENCOUNTER — Other Ambulatory Visit: Payer: Self-pay | Admitting: Family Medicine

## 2014-07-14 ENCOUNTER — Encounter: Payer: Self-pay | Admitting: Physician Assistant

## 2014-07-14 LAB — CULTURE, GROUP A STREP: ORGANISM ID, BACTERIA: NORMAL

## 2014-08-04 ENCOUNTER — Other Ambulatory Visit: Payer: Self-pay | Admitting: Family Medicine

## 2014-08-05 ENCOUNTER — Other Ambulatory Visit: Payer: Self-pay | Admitting: Pharmacist

## 2014-08-05 MED ORDER — APIXABAN 2.5 MG PO TABS: 2.5000 mg | ORAL_TABLET | Freq: Two times a day (BID) | ORAL | Status: DC

## 2014-08-10 ENCOUNTER — Other Ambulatory Visit: Payer: 59

## 2014-08-10 DIAGNOSIS — B20 Human immunodeficiency virus [HIV] disease: Secondary | ICD-10-CM

## 2014-08-11 ENCOUNTER — Other Ambulatory Visit: Payer: 59

## 2014-08-12 LAB — HIV-1 RNA QUANT-NO REFLEX-BLD: HIV 1 RNA Quant: <20 copies/mL (ref <20)

## 2014-08-25 ENCOUNTER — Telehealth: Payer: Self-pay | Admitting: Internal Medicine

## 2014-08-25 NOTE — Telephone Encounter (Signed)
Okay to renew

## 2014-08-25 NOTE — Telephone Encounter (Signed)
Refill request for zolpidem tartrate 10mg  # 30 to [facility]

## 2014-08-26 ENCOUNTER — Other Ambulatory Visit: Payer: Self-pay

## 2014-08-26 MED ORDER — ZOLPIDEM TARTRATE 10 MG PO TABS: 5.0000 mg | ORAL_TABLET | Freq: Every evening | ORAL | Status: DC | PRN

## 2014-08-26 NOTE — Telephone Encounter (Signed)
Called in ambien 10 mg # 30 w/ 1 refill

## 2014-09-07 ENCOUNTER — Other Ambulatory Visit: Payer: Self-pay | Admitting: Family Medicine

## 2014-09-07 NOTE — Telephone Encounter (Signed)
Is this okay?

## 2014-10-01 ENCOUNTER — Encounter: Payer: Self-pay | Admitting: Family Medicine

## 2014-10-02 ENCOUNTER — Other Ambulatory Visit: Payer: Self-pay

## 2014-10-02 DIAGNOSIS — M79605 Pain in left leg: Secondary | ICD-10-CM

## 2014-10-05 ENCOUNTER — Ambulatory Visit (HOSPITAL_COMMUNITY)
Admission: RE | Admit: 2014-10-05 | Discharge: 2014-10-05 | Disposition: A | Discharge status: Home / Self Care | Payer: 59 | Source: Ambulatory Visit | Attending: Family Medicine | Admitting: Family Medicine

## 2014-10-05 DIAGNOSIS — M79605 Pain in left leg: Secondary | ICD-10-CM | POA: Diagnosis not present

## 2014-10-05 DIAGNOSIS — Z86718 Personal history of other venous thrombosis and embolism: Secondary | ICD-10-CM | POA: Insufficient documentation

## 2014-10-05 NOTE — Progress Notes (Addendum)
Preliminary results by tech - Left lower extremity venous duplex.There is resolution of previous deep vein thrombosis in the left posterior tibial veins. Two attempts in contacting the office for results. Oda Cogan, BS, RDMS, RVT

## 2014-10-16 ENCOUNTER — Telehealth: Payer: Self-pay

## 2014-10-16 MED ORDER — FENOFIBRATE 160 MG PO TABS: 160.0000 mg | ORAL_TABLET | Freq: Every day | ORAL | Status: DC

## 2014-10-16 NOTE — Telephone Encounter (Signed)
Received a refill request for Fenofibrate 160mg  #90.

## 2014-11-19 ENCOUNTER — Ambulatory Visit (INDEPENDENT_AMBULATORY_CARE_PROVIDER_SITE_OTHER): Payer: 59 | Admitting: Family Medicine

## 2014-11-19 ENCOUNTER — Encounter: Payer: Self-pay | Admitting: Family Medicine

## 2014-11-19 VITALS — BP 120/80 | HR 70 | Wt 187.0 lb

## 2014-11-19 DIAGNOSIS — L989 Disorder of the skin and subcutaneous tissue, unspecified: Secondary | ICD-10-CM

## 2014-11-19 NOTE — Patient Instructions (Signed)
Cortisone cream twice a day sparingly

## 2014-11-19 NOTE — Progress Notes (Signed)
   Subjective:    Patient ID: Darryl Black, male    DOB: [birth year], 49 y.o.   MRN: [id number]  HPI He has a lesion on the inner aspect of his left thigh that he noticed several weeks ago. He was much larger and did shrink down the size but still present. It is not causing any pain or itching.   Review of Systems     Objective:   Physical Exam Erythematous slightly raised irregular lesion approximately 1 x 2 cm noted on the left medial inner thigh.       Assessment & Plan:  Skin lesion It appears benign and could easily be an insect bite. Recommend cortisone cream. If it gets larger, I can biopsy it if we need. He is comfortable with this.

## 2014-12-01 ENCOUNTER — Ambulatory Visit (INDEPENDENT_AMBULATORY_CARE_PROVIDER_SITE_OTHER): Payer: 59 | Admitting: Family Medicine

## 2014-12-01 ENCOUNTER — Encounter: Payer: Self-pay | Admitting: Family Medicine

## 2014-12-01 VITALS — BP 112/70 | HR 72 | Ht 69.5 in | Wt 183.0 lb

## 2014-12-01 DIAGNOSIS — Z209 Contact with and (suspected) exposure to unspecified communicable disease: Secondary | ICD-10-CM

## 2014-12-01 DIAGNOSIS — A6002 Herpesviral infection of other male genital organs: Secondary | ICD-10-CM

## 2014-12-01 DIAGNOSIS — B001 Herpesviral vesicular dermatitis: Secondary | ICD-10-CM | POA: Diagnosis not present

## 2014-12-01 DIAGNOSIS — A609 Anogenital herpesviral infection, unspecified: Secondary | ICD-10-CM

## 2014-12-01 DIAGNOSIS — Z8619 Personal history of other infectious and parasitic diseases: Secondary | ICD-10-CM | POA: Insufficient documentation

## 2014-12-01 MED ORDER — VALACYCLOVIR HCL 1 G PO TABS: ORAL_TABLET | ORAL | Status: DC

## 2014-12-01 NOTE — Progress Notes (Signed)
   Subjective:    Patient ID: Darryl Black, male    DOB: Oct 05, 1965, 49 y.o.   MRN: [id number]  HPI  he is here for evaluation of lesions on the upper lip bilaterally as well as one on his penis. He has had these for the last several weeks. His previous sexual activity was approximately one month ago. No discharge , dysuria or sore throat.   Review of Systems     Objective:   Physical Exam  Page Spiro and in no distress. Exam of the upper lips does show an erythematous ulcerated painful lesion on the left and a healing lesion on the right on the upper lip. Exam of the head of the penis does show only erythema.       Assessment & Plan:  Contact with or exposure to communicable disease - Plan: RPR, GC/chlamydia probe amp, urine  Herpes labialis - Plan: valACYclovir (VALTREX) 1000 MG tablet  Herpes genitalis in men - Plan: valACYclovir (VALTREX) 1000 MG tablet  discussed the treatment of the herpetic lesions. I will place him on a 5 day dosing rather than on just for labialis. Also discussed preventative treatment if indeed this becomes more of a chronic issue.

## 2014-12-02 LAB — GC/CHLAMYDIA PROBE AMP, URINE
CHLAMYDIA, SWAB/URINE, PCR: NEGATIVE
GC Probe Amp, Urine: NEGATIVE

## 2014-12-02 LAB — RPR

## 2014-12-11 ENCOUNTER — Encounter: Payer: Self-pay | Admitting: Gastroenterology

## 2015-01-26 ENCOUNTER — Ambulatory Visit (AMBULATORY_SURGERY_CENTER): Payer: Self-pay | Admitting: *Deleted

## 2015-01-26 VITALS — Ht 69.0 in | Wt 183.6 lb

## 2015-01-26 DIAGNOSIS — Z1211 Encounter for screening for malignant neoplasm of colon: Secondary | ICD-10-CM

## 2015-01-26 NOTE — Progress Notes (Signed)
No egg or soy allergy No issues with past sedation No diet pills No home 02 use   emmi video declined  Pt has hx of DVT- stopped xarelto in august 2016

## 2015-02-09 ENCOUNTER — Ambulatory Visit (AMBULATORY_SURGERY_CENTER): Payer: 59 | Admitting: Gastroenterology

## 2015-02-09 ENCOUNTER — Encounter: Payer: 59 | Admitting: Gastroenterology

## 2015-02-09 ENCOUNTER — Encounter: Payer: Self-pay | Admitting: Gastroenterology

## 2015-02-09 VITALS — BP 97/67 | HR 53 | Temp 97.9°F | Resp 14 | Ht 69.0 in | Wt 183.0 lb

## 2015-02-09 DIAGNOSIS — Z1211 Encounter for screening for malignant neoplasm of colon: Secondary | ICD-10-CM

## 2015-02-09 MED ORDER — SODIUM CHLORIDE 0.9 % IV SOLN: 500.0000 mL | INTRAVENOUS | Status: DC

## 2015-02-09 NOTE — Patient Instructions (Signed)
YOU HAD AN ENDOSCOPIC PROCEDURE TODAY AT THE [facility]:   Refer to the procedure report that was given to you for any specific questions about what was found during the examination.  If the procedure report does not answer your questions, please call your gastroenterologist to clarify.  If you requested that your care partner not be given the details of your procedure findings, then the procedure report has been included in a sealed envelope for you to review at your convenience later.  YOU SHOULD EXPECT: Some feelings of bloating in the abdomen. Passage of more gas than usual.  Walking can help get rid of the air that was put into your GI tract during the procedure and reduce the bloating. If you had a lower endoscopy (such as a colonoscopy or flexible sigmoidoscopy) you may notice spotting of blood in your stool or on the toilet paper. If you underwent a bowel prep for your procedure, you may not have a normal bowel movement for a few days.  Please Note:  You might notice some irritation and congestion in your nose or some drainage.  This is from the oxygen used during your procedure.  There is no need for concern and it should clear up in a day or so.  SYMPTOMS TO REPORT IMMEDIATELY:   Following lower endoscopy (colonoscopy or flexible sigmoidoscopy):  Excessive amounts of blood in the stool  Significant tenderness or worsening of abdominal pains  Swelling of the abdomen that is new, acute  Fever of 100F or higher   For urgent or emergent issues, a gastroenterologist can be reached at any hour by calling [phone].   DIET: Your first meal following the procedure should be a small meal and then it is ok to progress to your normal diet. Heavy or fried foods are harder to digest and may make you feel nauseous or bloated.  Likewise, meals heavy in dairy and vegetables can increase bloating.  Drink plenty of fluids but you should avoid alcoholic beverages for 24  hours.  ACTIVITY:  You should plan to take it easy for the rest of today and you should NOT DRIVE or use heavy machinery until tomorrow (because of the sedation medicines used during the test).    FOLLOW UP: Our staff will call the number listed on your records the next business day following your procedure to check on you and address any questions or concerns that you may have regarding the information given to you following your procedure. If we do not reach you, we will leave a message.  However, if you are feeling well and you are not experiencing any problems, there is no need to return our call.  We will assume that you have returned to your regular daily activities without incident.  If any biopsies were taken you will be contacted by phone or by letter within the next 1-3 weeks.  Please call us at [phone] if you have not heard about the biopsies in 3 weeks.    SIGNATURES/CONFIDENTIALITY: You and/or your care partner have signed paperwork which will be entered into your electronic medical record.  These signatures attest to the fact that that the information above on your After Visit Summary has been reviewed and is understood.  Full responsibility of the confidentiality of this discharge information lies with you and/or your care-partner.   Resume medications. 

## 2015-02-09 NOTE — Op Note (Signed)
[place]  [address] [place], [id number]   COLONOSCOPY PROCEDURE REPORT  PATIENT: Darryl Black, Darryl Black  MR#: [id number] BIRTHDATE: 1965/11/30 , 49  yrs. old GENDER: male ENDOSCOPIST: Ladene Artist, MD, [facility] REFERRED BY: Jill Alexanders, M.D. PROCEDURE DATE:  02/09/2015 PROCEDURE:   Colonoscopy, screening First Screening Colonoscopy - Avg.  risk and is 50 yrs.  old or older Yes.  Prior Negative Screening - Now for repeat screening. N/A  History of Adenoma - Now for follow-up colonoscopy & has been > or = to 3 yrs.  N/A  Polyps removed today? No Recommend repeat exam, <10 yrs? No ASA CLASS:   Class II INDICATIONS:Screening for colonic neoplasia and Colorectal Neoplasm Risk Assessment for this procedure is average risk. MEDICATIONS: Monitored anesthesia care and Propofol 200 mg IV DESCRIPTION OF PROCEDURE:   After the risks benefits and alternatives of the procedure were thoroughly explained, informed consent was obtained.  The digital rectal exam revealed no abnormalities of the rectum.   The LB [id number]-[id number]  endoscope was introduced through the anus and advanced to the cecum, which was identified by both the appendix and ileocecal valve. No adverse events experienced.   The quality of the prep was good.  (MiraLax was used)  The instrument was then slowly withdrawn as the colon was fully examined. Estimated blood loss is zero unless otherwise noted in this procedure report.    COLON FINDINGS: A normal appearing cecum, ileocecal valve, and appendiceal orifice were identified.  The ascending, transverse, descending, sigmoid colon, and rectum appeared unremarkable. Retroflexed views revealed no abnormalities. The time to cecum = 1.7 Withdrawal time = 9.3   The scope was withdrawn and the procedure completed. COMPLICATIONS: There were no immediate complications.  ENDOSCOPIC IMPRESSION: Normal colonoscopy  RECOMMENDATIONS: Continue current colorectal  screening recommendations for "routine risk" patients with a repeat colonoscopy in 10 years.  eSigned:  Ladene Artist, MD, [facility] 02/09/2015 2:55 PM

## 2015-02-09 NOTE — Progress Notes (Signed)
Report to PACU, RN, vss, BBS= Clear.  

## 2015-02-10 ENCOUNTER — Telehealth: Payer: Self-pay

## 2015-02-10 NOTE — Telephone Encounter (Signed)
Per patient request do not leave message.

## 2015-03-09 DIAGNOSIS — H10413 Chronic giant papillary conjunctivitis, bilateral: Secondary | ICD-10-CM | POA: Diagnosis not present

## 2015-03-09 DIAGNOSIS — H01029 Squamous blepharitis unspecified eye, unspecified eyelid: Secondary | ICD-10-CM | POA: Diagnosis not present

## 2015-03-09 DIAGNOSIS — H04123 Dry eye syndrome of bilateral lacrimal glands: Secondary | ICD-10-CM | POA: Diagnosis not present

## 2015-05-27 ENCOUNTER — Other Ambulatory Visit: Payer: Self-pay | Admitting: Family Medicine

## 2015-05-27 NOTE — Telephone Encounter (Signed)
Is this okay to refill? 

## 2015-06-15 ENCOUNTER — Encounter: Payer: Self-pay | Admitting: Family Medicine

## 2015-06-15 ENCOUNTER — Ambulatory Visit (INDEPENDENT_AMBULATORY_CARE_PROVIDER_SITE_OTHER): Payer: 59 | Admitting: Family Medicine

## 2015-06-15 VITALS — BP 116/70 | HR 54 | Ht 69.0 in | Wt 188.6 lb

## 2015-06-15 DIAGNOSIS — Z1159 Encounter for screening for other viral diseases: Secondary | ICD-10-CM | POA: Diagnosis not present

## 2015-06-15 DIAGNOSIS — B20 Human immunodeficiency virus [HIV] disease: Secondary | ICD-10-CM

## 2015-06-15 DIAGNOSIS — Z8619 Personal history of other infectious and parasitic diseases: Secondary | ICD-10-CM

## 2015-06-15 DIAGNOSIS — E781 Pure hyperglyceridemia: Secondary | ICD-10-CM | POA: Diagnosis not present

## 2015-06-15 DIAGNOSIS — K449 Diaphragmatic hernia without obstruction or gangrene: Secondary | ICD-10-CM

## 2015-06-15 DIAGNOSIS — Z87442 Personal history of urinary calculi: Secondary | ICD-10-CM

## 2015-06-15 DIAGNOSIS — Z8719 Personal history of other diseases of the digestive system: Secondary | ICD-10-CM

## 2015-06-15 DIAGNOSIS — Z0000 Encounter for general adult medical examination without abnormal findings: Secondary | ICD-10-CM | POA: Diagnosis not present

## 2015-06-15 LAB — CBC WITH DIFFERENTIAL/PLATELET
Basophils Absolute: 74 cells/uL (ref 0–200)
Basophils Relative: 1 %
Eosinophils Absolute: 148 cells/uL (ref 15–500)
Eosinophils Relative: 2 %
HCT: 48.2 % (ref 38.5–50.0)
Hemoglobin: 16.7 g/dL (ref 13.2–17.1)
LYMPHS PCT: 49 %
Lymphs Abs: 3626 cells/uL (ref 850–3900)
MCH: 33.5 pg — ABNORMAL HIGH (ref 27.0–33.0)
MCHC: 34.6 g/dL (ref 32.0–36.0)
MCV: 96.8 fL (ref 80.0–100.0)
MONOS PCT: 12 %
MPV: 10.5 fL (ref 7.5–12.5)
Monocytes Absolute: 888 cells/uL (ref 200–950)
Neutro Abs: 2664 cells/uL (ref 1500–7800)
Neutrophils Relative %: 36 %
Platelets: 228 10*3/uL (ref 140–400)
RBC: 4.98 MIL/uL (ref 4.20–5.80)
RDW: 13.5 % (ref 11.0–15.0)
WBC: 7.4 10*3/uL (ref 4.0–10.5)

## 2015-06-15 LAB — COMPREHENSIVE METABOLIC PANEL
ALK PHOS: 53 U/L (ref 40–115)
ALT: 55 U/L — AB (ref 9–46)
AST: 23 U/L (ref 10–35)
Albumin: 4.6 g/dL (ref 3.6–5.1)
BILIRUBIN TOTAL: 0.8 mg/dL (ref 0.2–1.2)
BUN: 15 mg/dL (ref 7–25)
CALCIUM: 9.5 mg/dL (ref 8.6–10.3)
CO2: 25 mmol/L (ref 20–31)
Chloride: 101 mmol/L (ref 98–110)
Creat: 1.53 mg/dL — ABNORMAL HIGH (ref 0.70–1.33)
GLUCOSE: 99 mg/dL (ref 65–99)
Potassium: 4.3 mmol/L (ref 3.5–5.3)
Sodium: 138 mmol/L (ref 135–146)
Total Protein: 7.5 g/dL (ref 6.1–8.1)

## 2015-06-15 LAB — POCT URINALYSIS DIPSTICK
Bilirubin, UA: NEGATIVE
Glucose, UA: NEGATIVE
Ketones, UA: NEGATIVE
Leukocytes, UA: NEGATIVE
NITRITE UA: NEGATIVE
PH UA: 6
Protein, UA: NEGATIVE
RBC UA: NEGATIVE
Spec Grav, UA: >=1.03
Urobilinogen, UA: NEGATIVE

## 2015-06-15 LAB — LIPID PANEL
Cholesterol: 197 mg/dL (ref 125–200)
HDL: 37 mg/dL — ABNORMAL LOW (ref >=40)
LDL CALC: 115 mg/dL (ref <130)
Total CHOL/HDL Ratio: 5.3 Ratio — ABNORMAL HIGH (ref <=5.0)
Triglycerides: 225 mg/dL — ABNORMAL HIGH (ref <150)
VLDL: 45 mg/dL — ABNORMAL HIGH (ref <30)

## 2015-06-15 MED ORDER — ELVITEG-COBIC-EMTRICIT-TENOFDF 150-150-200-300 MG PO TABS
ORAL_TABLET | ORAL | Status: DC
Start: 2015-06-15 — End: 2016-06-26

## 2015-06-15 MED ORDER — FENOFIBRATE 160 MG PO TABS: 160.0000 mg | ORAL_TABLET | Freq: Every day | ORAL | Status: DC

## 2015-06-15 NOTE — Progress Notes (Signed)
Subjective:     Patient ID: Darryl Black, male   DOB: [birth year], 50 y.o.   MRN: [id number]  HPI Darryl Black comes to clinic today for his annual physical exam.  He is HIV positive and currently is stable on his Stribild regimen.  He denies any problems with side effects to this medication including nausea, diarrhea, or issues with sleep.  His last viral load in June was undetectable.  He was seen 6 months ago for genital and oral herpes and was given valacyclovir, but the lesions resolved without treatment and he has not had any recurrence.  His reflux due to his hiatal hernia has been giving him more trouble recently.  He does not currently take a PPI due to its interaction with his HIV therapy, but is continuing to have some solid food dysphagia that he notices especially when eating meat.  He has a history of esophageal stricture with dilatation over 5 years ago.  His hypertriglyceridemia is currently managed with lovaza and fenofibrate.  He had a renal stone over 3 years ago and has not had any problem since then.   He describes a well-balanced diet but says he would like to cut more sugar from his diet.  He exercises every day by either walking on a trail with his dogs, cycling, or going to the gym where he works out for about 45 mins-1hr.    Darryl. 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  He uses his ambien to readjust his sleep schedule if he works too many night shifts in a row.  He says this is less than 1x / week.  He is married and has been with his husband for 25 years.  He denies smoking, alcohol, and illicit drug use.   Family and social history as well as health maintenance and immunizations were reviewed  Review of Systems  Constitutional: Negative for fever, chills, fatigue and unexpected weight change.  HENT: Negative for ear pain and tinnitus.   Eyes: Negative for pain and visual disturbance.   Respiratory: Negative for shortness of breath.   Cardiovascular: Negative for chest pain and palpitations.  Gastrointestinal: Negative for nausea, abdominal pain and diarrhea.  Genitourinary: Negative for dysuria, urgency and difficulty urinating.  Musculoskeletal: Negative for myalgias and joint swelling.       Objective:   Physical Exam Alert and in no distress.  Tympanic membranes and canals are normal.  Fundoscopic exam reveals normal vessels without hemorrhage.  Pharyngeal area is normal. Neck is supple without adenopathy or thyromegaly.  Cardiac exam shows a regular sinus rhythm without murmurs or gallops.  Lungs are clear to auscultation bilaterally without wheezes, rales, rhonchi. On abdominal exam, non-tender to palpation in all quadrants, normal bowel sounds, and no HSM.    Assessment:     Routine general medical examination at a health care facility - Plan: POCT Urinalysis Dipstick, CBC with Differential/Platelet, Comprehensive metabolic panel, Lipid panel  Hypertriglyceridemia - Plan: Lipid panel, fenofibrate 160 MG tablet  HIV disease ([place]) - Plan: CBC with Differential/Platelet, Comprehensive metabolic panel, Lipid panel, HIV 1 RNA quant-no reflex-bld, T-helper cells (CD4) count (not at [facility]), elvitegravir-cobicistat-emtricitabine-tenofovir (STRIBILD) 150-150-200-300 MG TABS tablet  History of renal stone  HH (hiatus hernia)  H/O herpes labialis  Gilbert's disease  History of herpes genitalis  Need for hepatitis C screening test - Plan: Hepatitis C antibody  Hiatal hernia / history of esophageal stricture Given his  persistent issues with reflux, we will suggest starting omeperazole.  Will follow conservative recommendations to separate dosing of Stribild and PPI by at least 2 hrs.    HIV disease Will get viral load and CD4 count today and continue current STRIBILD regimen.          History and physical exam conducted by Marylen Ponto (Medical Student) in  conjunction with Dr. Redmond School.

## 2015-06-16 LAB — HIV-1 RNA QUANT-NO REFLEX-BLD
HIV 1 RNA Quant: <20 copies/mL (ref <20)
HIV-1 RNA Quant, Log: <1.3 Log copies/mL (ref <1.30)

## 2015-06-16 LAB — HEPATITIS C ANTIBODY: HCV AB: NEGATIVE

## 2015-06-16 LAB — T-HELPER CELLS (CD4) COUNT (NOT AT ARMC)
Absolute CD4: 1171 cells/uL (ref 381–1469)
CD4 T HELPER %: 29 % — AB (ref 32–62)
TOTAL LYMPHOCYTE COUNT: 4054 {cells}/uL — AB (ref 700–3300)

## 2015-07-23 ENCOUNTER — Telehealth: Payer: Self-pay

## 2015-07-23 NOTE — Telephone Encounter (Signed)
Fax rcvd requesting refill of Zolpidem 10 mg to [facility].Thanks,  Victorino December

## 2015-07-25 NOTE — Telephone Encounter (Signed)
ok 

## 2015-07-27 MED ORDER — ZOLPIDEM TARTRATE 10 MG PO TABS: 5.0000 mg | ORAL_TABLET | Freq: Every evening | ORAL | Status: DC | PRN

## 2015-07-27 NOTE — Telephone Encounter (Signed)
Called in rx to [facility] for 30 days of Zolpidem.

## 2015-11-16 ENCOUNTER — Other Ambulatory Visit: Payer: Self-pay | Admitting: Family Medicine

## 2015-12-06 ENCOUNTER — Other Ambulatory Visit: Payer: Self-pay | Admitting: Family Medicine

## 2015-12-06 DIAGNOSIS — B001 Herpesviral vesicular dermatitis: Secondary | ICD-10-CM

## 2015-12-06 DIAGNOSIS — A6002 Herpesviral infection of other male genital organs: Secondary | ICD-10-CM

## 2015-12-06 NOTE — Telephone Encounter (Signed)
Is this okay to refill? 

## 2016-01-11 ENCOUNTER — Encounter: Payer: Self-pay | Admitting: Family Medicine

## 2016-01-11 ENCOUNTER — Ambulatory Visit (INDEPENDENT_AMBULATORY_CARE_PROVIDER_SITE_OTHER): Payer: 59 | Admitting: Family Medicine

## 2016-01-11 VITALS — BP 116/78 | HR 65 | Ht 69.0 in | Wt 185.0 lb

## 2016-01-11 DIAGNOSIS — Z8719 Personal history of other diseases of the digestive system: Secondary | ICD-10-CM | POA: Diagnosis not present

## 2016-01-11 DIAGNOSIS — Z87442 Personal history of urinary calculi: Secondary | ICD-10-CM | POA: Diagnosis not present

## 2016-01-11 DIAGNOSIS — Z79899 Other long term (current) drug therapy: Secondary | ICD-10-CM

## 2016-01-11 DIAGNOSIS — E781 Pure hyperglyceridemia: Secondary | ICD-10-CM

## 2016-01-11 DIAGNOSIS — B20 Human immunodeficiency virus [HIV] disease: Secondary | ICD-10-CM | POA: Diagnosis not present

## 2016-01-11 LAB — CBC WITH DIFFERENTIAL/PLATELET
Basophils Absolute: 83 cells/uL (ref 0–200)
Basophils Relative: 1 %
EOS PCT: 1 %
Eosinophils Absolute: 83 cells/uL (ref 15–500)
HCT: 47.4 % (ref 38.5–50.0)
Hemoglobin: 16.4 g/dL (ref 13.2–17.1)
Lymphocytes Relative: 51 %
Lymphs Abs: 4233 cells/uL — ABNORMAL HIGH (ref 850–3900)
MCH: 34.3 pg — AB (ref 27.0–33.0)
MCHC: 34.6 g/dL (ref 32.0–36.0)
MCV: 99.2 fL (ref 80.0–100.0)
MPV: 10.4 fL (ref 7.5–12.5)
Monocytes Absolute: 913 cells/uL (ref 200–950)
Monocytes Relative: 11 %
NEUTROS PCT: 36 %
Neutro Abs: 2988 cells/uL (ref 1500–7800)
Platelets: 259 10*3/uL (ref 140–400)
RBC: 4.78 MIL/uL (ref 4.20–5.80)
RDW: 13.7 % (ref 11.0–15.0)
WBC: 8.3 10*3/uL (ref 4.0–10.5)

## 2016-01-11 LAB — COMPREHENSIVE METABOLIC PANEL
ALBUMIN: 4.6 g/dL (ref 3.6–5.1)
ALT: 42 U/L (ref 9–46)
AST: 22 U/L (ref 10–35)
Alkaline Phosphatase: 56 U/L (ref 40–115)
BUN: 17 mg/dL (ref 7–25)
CHLORIDE: 102 mmol/L (ref 98–110)
CO2: 27 mmol/L (ref 20–31)
CREATININE: 1.76 mg/dL — AB (ref 0.70–1.33)
Calcium: 9.6 mg/dL (ref 8.6–10.3)
Glucose, Bld: 90 mg/dL (ref 65–99)
Potassium: 4.3 mmol/L (ref 3.5–5.3)
SODIUM: 137 mmol/L (ref 135–146)
Total Bilirubin: 0.8 mg/dL (ref 0.2–1.2)
Total Protein: 7.5 g/dL (ref 6.1–8.1)

## 2016-01-11 LAB — LIPID PANEL
CHOLESTEROL: 169 mg/dL (ref <200)
HDL: 36 mg/dL — ABNORMAL LOW (ref >40)
LDL Cholesterol: 92 mg/dL (ref <100)
Total CHOL/HDL Ratio: 4.7 Ratio (ref <5.0)
Triglycerides: 205 mg/dL — ABNORMAL HIGH (ref <150)
VLDL: 41 mg/dL — ABNORMAL HIGH (ref <30)

## 2016-01-11 NOTE — Progress Notes (Signed)
   Subjective:    Patient ID: [name], male    DOB: [birth year], 50 y.o.   MRN: [id number]  HPI He is here for medication recheck. He continues on his Stribild and is having no difficulty with that. He takes a few fenofibrate and Lovaza at a different time. He then will also take the PPI later on in the evening. He is having no difficulty with this. No fever, chills, weight change, cough or congestion. He does have a previous history of renal stone but none recently. He does have a history of esophageal stricture but at this point is using a PPI as needed and also knows that he needs to chew his foods quite vigorously to keep from getting them stuck. Work and home life are going quite well. He does not smoke or drink.   Review of Systems     Objective:   Physical Exam Alert and in no distress. Tympanic membranes and canals are normal. Pharyngeal area is normal. Neck is supple without adenopathy or thyromegaly. Cardiac exam shows a regular sinus rhythm without murmurs or gallops. Lungs are clear to auscultation. Abdominal exam shows no masses or tenderness with normal bowel sounds.       Assessment & Plan:  Hypertriglyceridemia - Plan: Lipid panel  HIV disease ([place]) - Plan: CBC with Differential/Platelet, Comprehensive metabolic panel, HIV 1 RNA quant-no reflex-bld, T-helper cells (CD4) count (not at [facility]), Lipid panel  History of renal stone  History of esophageal stricture  Encounter for long-term (current) use of high-risk medication - Plan: CBC with Differential/Platelet, Comprehensive metabolic panel, HIV 1 RNA quant-no reflex-bld He is doing quite well. We will continue him on his present medication regimen.

## 2016-01-12 LAB — T-HELPER CELLS (CD4) COUNT (NOT AT ARMC)
ABSOLUTE CD4: 1048 {cells}/uL (ref 381–1469)
CD4 T HELPER %: 30 % — AB (ref 32–62)
Total lymphocyte count: 3504 cells/uL — ABNORMAL HIGH (ref 700–3300)

## 2016-01-14 LAB — HIV-1 RNA QUANT-NO REFLEX-BLD: HIV 1 RNA Quant: <20 copies/mL (ref <20)

## 2016-02-23 ENCOUNTER — Other Ambulatory Visit: Payer: Self-pay

## 2016-02-23 ENCOUNTER — Telehealth: Payer: Self-pay

## 2016-02-23 MED ORDER — ZOLPIDEM TARTRATE 10 MG PO TABS: 5.0000 mg | ORAL_TABLET | Freq: Every evening | ORAL | 1 refills | Status: DC | PRN

## 2016-02-23 NOTE — Telephone Encounter (Signed)
I have called in [place] per [employer]

## 2016-02-23 NOTE — Telephone Encounter (Signed)
Fax rcvd for pt's ambien refill to be called into [facility].

## 2016-02-23 NOTE — Telephone Encounter (Signed)
Okay to refill? 

## 2016-03-30 DIAGNOSIS — H5213 Myopia, bilateral: Secondary | ICD-10-CM | POA: Diagnosis not present

## 2016-03-30 DIAGNOSIS — H524 Presbyopia: Secondary | ICD-10-CM | POA: Diagnosis not present

## 2016-04-06 ENCOUNTER — Other Ambulatory Visit: Payer: Self-pay | Admitting: Family Medicine

## 2016-06-06 ENCOUNTER — Ambulatory Visit (INDEPENDENT_AMBULATORY_CARE_PROVIDER_SITE_OTHER): Payer: 59 | Admitting: Family Medicine

## 2016-06-06 ENCOUNTER — Other Ambulatory Visit: Payer: Self-pay | Admitting: Family Medicine

## 2016-06-06 ENCOUNTER — Encounter: Payer: Self-pay | Admitting: Family Medicine

## 2016-06-06 VITALS — BP 130/80 | HR 77 | Wt 189.0 lb

## 2016-06-06 DIAGNOSIS — L820 Inflamed seborrheic keratosis: Secondary | ICD-10-CM | POA: Diagnosis not present

## 2016-06-06 DIAGNOSIS — L989 Disorder of the skin and subcutaneous tissue, unspecified: Secondary | ICD-10-CM

## 2016-06-06 NOTE — Progress Notes (Signed)
   Subjective:    Patient ID: Darryl Black, male    DOB: 11/29/1965, 51 y.o.   MRN: [id number]  HPI He is here for evaluation of a mid chest lesion that he thinks has grown slightly over the last several months.   Review of Systems     Objective:   Physical Exam A 1 cm raised slightly pigmented and questionably vascular lesion is noted in the mid chest area.       Assessment & Plan:  Skin lesion of chest wall The lesion was injected with a cane and epinephrine. A 2 mm punch biopsy was done without difficulty. Band-Aid was applied.

## 2016-06-13 ENCOUNTER — Encounter: Payer: Self-pay | Admitting: Family Medicine

## 2016-06-26 ENCOUNTER — Other Ambulatory Visit: Payer: Self-pay | Admitting: Family Medicine

## 2016-06-26 DIAGNOSIS — B20 Human immunodeficiency virus [HIV] disease: Secondary | ICD-10-CM

## 2016-06-26 NOTE — Telephone Encounter (Signed)
Is this okay to refill? 

## 2016-06-27 ENCOUNTER — Ambulatory Visit (INDEPENDENT_AMBULATORY_CARE_PROVIDER_SITE_OTHER): Payer: 59 | Admitting: Family Medicine

## 2016-06-27 ENCOUNTER — Encounter: Payer: Self-pay | Admitting: Family Medicine

## 2016-06-27 VITALS — BP 124/82 | HR 60 | Ht 69.0 in | Wt 191.0 lb

## 2016-06-27 DIAGNOSIS — B001 Herpesviral vesicular dermatitis: Secondary | ICD-10-CM

## 2016-06-27 DIAGNOSIS — K449 Diaphragmatic hernia without obstruction or gangrene: Secondary | ICD-10-CM | POA: Diagnosis not present

## 2016-06-27 DIAGNOSIS — E781 Pure hyperglyceridemia: Secondary | ICD-10-CM

## 2016-06-27 DIAGNOSIS — Z79899 Other long term (current) drug therapy: Secondary | ICD-10-CM

## 2016-06-27 DIAGNOSIS — Z87442 Personal history of urinary calculi: Secondary | ICD-10-CM

## 2016-06-27 DIAGNOSIS — Z8719 Personal history of other diseases of the digestive system: Secondary | ICD-10-CM | POA: Diagnosis not present

## 2016-06-27 DIAGNOSIS — B20 Human immunodeficiency virus [HIV] disease: Secondary | ICD-10-CM

## 2016-06-27 DIAGNOSIS — Z0000 Encounter for general adult medical examination without abnormal findings: Secondary | ICD-10-CM

## 2016-06-27 LAB — CBC WITH DIFFERENTIAL/PLATELET
BASOS PCT: 0 %
Basophils Absolute: 0 cells/uL (ref 0–200)
EOS PCT: 2 %
Eosinophils Absolute: 140 cells/uL (ref 15–500)
HCT: 47.9 % (ref 38.5–50.0)
Hemoglobin: 16.4 g/dL (ref 13.2–17.1)
LYMPHS PCT: 43 %
Lymphs Abs: 3010 cells/uL (ref 850–3900)
MCH: 33.5 pg — ABNORMAL HIGH (ref 27.0–33.0)
MCHC: 34.2 g/dL (ref 32.0–36.0)
MCV: 98 fL (ref 80.0–100.0)
MONOS PCT: 14 %
MPV: 10.7 fL (ref 7.5–12.5)
Monocytes Absolute: 980 cells/uL — ABNORMAL HIGH (ref 200–950)
Neutro Abs: 2870 cells/uL (ref 1500–7800)
Neutrophils Relative %: 41 %
PLATELETS: 194 10*3/uL (ref 140–400)
RBC: 4.89 MIL/uL (ref 4.20–5.80)
RDW: 13.2 % (ref 11.0–15.0)
WBC: 7 10*3/uL (ref 4.0–10.5)

## 2016-06-27 MED ORDER — FENOFIBRATE 160 MG PO TABS: 160.0000 mg | ORAL_TABLET | Freq: Every day | ORAL | 3 refills | Status: DC

## 2016-06-27 MED ORDER — VALACYCLOVIR HCL 1 G PO TABS: ORAL_TABLET | ORAL | 5 refills | Status: DC

## 2016-06-27 MED ORDER — OMEGA-3-ACID ETHYL ESTERS 1 G PO CAPS: 2.0000 | ORAL_CAPSULE | Freq: Two times a day (BID) | ORAL | 3 refills | Status: DC

## 2016-06-27 MED ORDER — ELVITEG-COBIC-EMTRICIT-TENOFDF 150-150-200-300 MG PO TABS: 1.0000 | ORAL_TABLET | Freq: Every day | ORAL | 3 refills | Status: DC

## 2016-06-27 NOTE — Progress Notes (Signed)
Subjective:    Patient ID: Darryl Black, male    DOB: Nov 08, 1965, 51 y.o.   MRN: [id number]  HPI He is here for complete examination. He does have underlying HIV and continues on Triavil. He is having no difficulties with the medication. He has been stable on that for quite some time. He continues on fenofibrate as well as Lovaza to help with his hypertriglyceridemia and again is having no muscle aches and pain, fatigue etc. Continues on a multivitamin. He does occasionally have difficulty with herpes labialis and uses Valtrex on an as-needed basis. He does have a history of hiatus hernia as well as esophageal stricture and is now starting to have difficulty with solid foods. Apparently this is becoming more of an issue. He also has a remote history of renal stone but none recently. Once or twice per month he will also use Ambien to help when he shifts from one shift scheduled to another. Review of the record also indicates lab evidence of Gilbert's. He does not smoke or drink. His home life is stable. He has been in a monogamous relationship for quite some time. Family and social history as well as health maintenance immunizations were reviewed.   Review of Systems  All other systems reviewed and are negative.      Objective:   Physical Exam BP 124/82   Pulse 60   Ht 5\' 9"  (1.753 m)   Wt 191 lb (86.6 kg)   SpO2 99%   BMI 28.21 kg/m   General Appearance:    Alert, cooperative, no distress, appears stated age  Head:    Normocephalic, without obvious abnormality, atraumatic  Eyes:    PERRL, conjunctiva/corneas clear, EOM's intact, fundi    benign  Ears:    Normal TM's and external ear canals  Nose:   Nares normal, mucosa normal, no drainage or sinus   tenderness  Throat:   Lips, mucosa, and tongue normal; teeth and gums normal  Neck:   Supple, no lymphadenopathy;  thyroid:  no   enlargement/tenderness/nodules; no carotid   bruit or JVD     Lungs:     Clear to auscultation  bilaterally without wheezes, rales or     ronchi; respirations unlabored  Chest Wall:    No tenderness or deformity   Heart:    Regular rate and rhythm, S1 and S2 normal, no murmur, rub   or gallop  Breast Exam:    No chest wall tenderness, masses or gynecomastia  Abdomen:     Soft, non-tender, nondistended, normoactive bowel sounds,    no masses, no hepatosplenomegaly  Genitalia:    Normal male external genitalia without lesions.  Testicles without masses.  No inguinal hernias.  Rectal:   Deferred   Extremities:   No clubbing, cyanosis or edema  Pulses:   2+ and symmetric all extremities  Skin:   Skin color, texture, turgor normal, no rashes or lesions  Lymph nodes:   Cervical, supraclavicular, and axillary nodes normal  Neurologic:   CNII-XII intact, normal strength, sensation and gait; reflexes 2+ and symmetric throughout          Psych:   Normal mood, affect, hygiene and grooming.          Assessment & Plan:  Routine general medical examination at a health care facility - Plan: CBC with Differential/Platelet, Comprehensive metabolic panel, Lipid panel  Hypertriglyceridemia - Plan: omega-3 acid ethyl esters (LOVAZA) 1 g capsule, fenofibrate 160 MG tablet  HIV disease ([place]) -  Plan: CBC with Differential/Platelet, Comprehensive metabolic panel, Lipid panel, HIV 1 RNA quant-no reflex-bld, T-helper cells (CD4) count (not at [facility]), elvitegravir-cobicistat-emtricitabine-tenofovir (STRIBILD) 150-150-200-300 MG TABS tablet  History of renal stone  History of esophageal stricture - Plan: Ambulatory referral to Gastroenterology  Encounter for long-term (current) use of high-risk medication  HH (hiatus hernia) - Plan: Ambulatory referral to Gastroenterology  Gilbert's disease  Herpes labialis - Plan: valACYclovir (VALTREX) 1000 MG tablet Overall he is quite stable on the above diagnoses and medications. They were were renewed. Discussed GI referral for his stricture and we will make  this. I encouraged him to continue to take good care of himself.

## 2016-06-28 LAB — LIPID PANEL
CHOLESTEROL: 194 mg/dL (ref <200)
HDL: 35 mg/dL — AB (ref >40)
LDL CALC: 115 mg/dL — AB (ref <100)
TRIGLYCERIDES: 220 mg/dL — AB (ref <150)
Total CHOL/HDL Ratio: 5.5 Ratio — ABNORMAL HIGH (ref <5.0)
VLDL: 44 mg/dL — ABNORMAL HIGH (ref <30)

## 2016-06-28 LAB — T-HELPER CELLS (CD4) COUNT (NOT AT ARMC)
Absolute CD4: 953 cells/uL (ref 490–1740)
CD4 T HELPER %: 30 % (ref 30–61)
TOTAL LYMPHOCYTE COUNT: 3177 {cells}/uL (ref 850–3900)

## 2016-06-28 LAB — CMP 10231
AG Ratio: 1.4 Ratio (ref 1.0–2.5)
ALT: 54 U/L — AB (ref 9–46)
AST: 24 U/L (ref 10–35)
Albumin: 4.2 g/dL (ref 3.6–5.1)
Alkaline Phosphatase: 58 U/L (ref 40–115)
BUN/Creatinine Ratio: 9.9 Ratio (ref 6–22)
BUN: 17 mg/dL (ref 7–25)
CO2: 19 mmol/L — AB (ref 20–31)
Calcium: 9.7 mg/dL (ref 8.6–10.3)
Chloride: 101 mmol/L (ref 98–110)
Creat: 1.72 mg/dL — ABNORMAL HIGH (ref 0.70–1.33)
GFR, EST AFRICAN AMERICAN: 52 mL/min — AB (ref >=60)
GFR, EST NON AFRICAN AMERICAN: 45 mL/min — AB (ref >=60)
GLUCOSE: 118 mg/dL — AB (ref 65–99)
Globulin: 3.1 g/dL (ref 1.9–3.7)
Potassium: 4.4 mmol/L (ref 3.5–5.3)
SODIUM: 137 mmol/L (ref 135–146)
Total Bilirubin: 0.6 mg/dL (ref 0.2–1.2)
Total Protein: 7.3 g/dL (ref 6.1–8.1)

## 2016-06-29 LAB — HIV-1 RNA QUANT-NO REFLEX-BLD
HIV 1 RNA Quant: <20 copies/mL
HIV-1 RNA Quant, Log: <1.3 Log copies/mL

## 2016-07-20 ENCOUNTER — Encounter: Payer: Self-pay | Admitting: Gastroenterology

## 2016-07-20 ENCOUNTER — Ambulatory Visit (INDEPENDENT_AMBULATORY_CARE_PROVIDER_SITE_OTHER): Payer: 59 | Admitting: Gastroenterology

## 2016-07-20 VITALS — BP 110/90 | HR 76 | Ht 69.0 in | Wt 187.2 lb

## 2016-07-20 DIAGNOSIS — K222 Esophageal obstruction: Secondary | ICD-10-CM | POA: Diagnosis not present

## 2016-07-20 DIAGNOSIS — R1310 Dysphagia, unspecified: Secondary | ICD-10-CM | POA: Diagnosis not present

## 2016-07-20 DIAGNOSIS — K219 Gastro-esophageal reflux disease without esophagitis: Secondary | ICD-10-CM

## 2016-07-20 DIAGNOSIS — R1319 Other dysphagia: Secondary | ICD-10-CM

## 2016-07-20 NOTE — Progress Notes (Signed)
History of Present Illness: This is a 51 year old male with dysphagia. He has a history of GERD and an esophageal stricture that was dilated in 04/2012. He states since fall 2017 he has had intermittent solid food dysphagia and occasionally has regurgitated or induced vomiting to relieve symptoms. Takes Prilosec OTC qd as needed for intermittent heartburn symptoms. His heartburn symptoms are not daily. Denies weight loss, abdominal pain, constipation, diarrhea, change in stool caliber, melena, hematochezia, nausea, vomiting, chest pain.  No Known Allergies Outpatient Medications Prior to Visit  Medication Sig Dispense Refill  . cholecalciferol (VITAMIN D) 1000 UNITS tablet Take 1,000 Units by mouth daily.    Marland Kitchen elvitegravir-cobicistat-emtricitabine-tenofovir (STRIBILD) 150-150-200-300 MG TABS tablet Take 1 tablet by mouth daily with breakfast. 90 tablet 3  . fenofibrate 160 MG tablet Take 1 tablet (160 mg total) by mouth daily. 90 tablet 3  . Multiple Vitamin (MULTIVITAMIN WITH MINERALS) TABS Take 1 tablet by mouth daily.    Marland Kitchen omega-3 acid ethyl esters (LOVAZA) 1 g capsule Take 2 capsules (2 g total) by mouth 2 (two) times daily. 360 capsule 3  . valACYclovir (VALTREX) 1000 MG tablet TAKE 1 TABLET BY MOUTH TWICE DAILY FOR 5 DAYS AT THE ONSET OF SYMPTOMS 20 tablet 5  . zolpidem (AMBIEN) 10 MG tablet Take 0.5-1 tablets (5-10 mg total) by mouth at bedtime as needed for sleep. 30 tablet 1   No facility-administered medications prior to visit.    Past Medical History:  Diagnosis Date  . DVT (deep venous thrombosis) ([place])    after half marathon   . Dyslipidemia   . GERD (gastroesophageal reflux disease)   . Gilbert's disease   . HH (hiatus hernia)   . HIV positive ([place]) 89  . Hyperlipidemia   . Renal stone    Past Surgical History:  Procedure Laterality Date  . APPENDECTOMY    . COLONOSCOPY     last 10 years + ago 2002 in epic- colon all normal  . UPPER GASTROINTESTINAL ENDOSCOPY      Social History   Social History  . Marital status: Single    Spouse name: N/A  . Number of children: N/A  . Years of education: N/A   Occupational History  . [employer]   Social History Main Topics  . Smoking status: Never Smoker  . Smokeless tobacco: Never Used  . Alcohol use No  . Drug use: No  . Sexual activity: Yes   Other Topics Concern  . None   Social History Narrative  . None   Family History  Problem Relation Age of Onset  . Pancreatic cancer Mother   . Diabetes Father   . Heart disease Maternal Grandfather   . Heart disease Paternal Grandfather   . Colon cancer Neg Hx   . Colon polyps Neg Hx   . Esophageal cancer Neg Hx   . Rectal cancer Neg Hx   . Stomach cancer Neg Hx      Physical Exam: General: Well developed, well nourished, no acute distress Head: Normocephalic and atraumatic Eyes:  sclerae anicteric, EOMI Ears: Normal auditory acuity Mouth: No deformity or lesions Lungs: Clear throughout to auscultation Heart: Regular rate and rhythm; no murmurs, rubs or bruits Abdomen: Soft, non tender and non distended. No masses, hepatosplenomegaly or hernias noted. Normal Bowel sounds Rectal: not done Musculoskeletal: Symmetrical with no gross deformities  Pulses:  Normal pulses noted Extremities: No clubbing, cyanosis, edema or deformities noted Neurological:  Alert oriented x 4, grossly nonfocal Psychological:  Alert and cooperative. Normal mood and affect  Assessment and Recommendations:  1. Esophageal dysphagia. GERD. An esophageal stricture is strongly suspected. Prilosec OTC 20 mg daily (not prn) separated in time from his HIV medication dosing. Follow standard antireflux measures. Schedule EGD with possible dilation. The risks (including bleeding, perforation, infection, missed lesions, medication reactions and possible hospitalization or surgery if complications occur), benefits, and alternatives to endoscopy  with possible biopsy and possible dilation were discussed with the patient and they consent to proceed.

## 2016-07-20 NOTE — Patient Instructions (Signed)
You have been scheduled for an endoscopy. Please follow written instructions given to you at your visit today. If you use inhalers (even only as needed), please bring them with you on the day of your procedure. Your physician has requested that you go to www.startemmi.com and enter the access code given to you at your visit today. This web site gives a general overview about your procedure. However, you should still follow specific instructions given to you by our office regarding your preparation for the procedure.  Thank you for choosing me and [facility].  Malcolm T. Stark, Jr., MD., FACG  

## 2016-07-26 ENCOUNTER — Ambulatory Visit (AMBULATORY_SURGERY_CENTER): Payer: 59 | Admitting: Gastroenterology

## 2016-07-26 ENCOUNTER — Encounter: Payer: Self-pay | Admitting: Gastroenterology

## 2016-07-26 VITALS — BP 110/72 | HR 78 | Temp 97.8°F | Resp 22 | Ht 69.0 in | Wt 187.0 lb

## 2016-07-26 DIAGNOSIS — R1310 Dysphagia, unspecified: Secondary | ICD-10-CM | POA: Diagnosis present

## 2016-07-26 DIAGNOSIS — R1319 Other dysphagia: Secondary | ICD-10-CM

## 2016-07-26 DIAGNOSIS — K222 Esophageal obstruction: Secondary | ICD-10-CM

## 2016-07-26 MED ORDER — SODIUM CHLORIDE 0.9 % IV SOLN: 500.0000 mL | INTRAVENOUS | Status: DC

## 2016-07-26 NOTE — Patient Instructions (Signed)
YOU HAD AN ENDOSCOPIC PROCEDURE TODAY AT [facility]:   Refer to the procedure report that was given to you for any specific questions about what was found during the examination.  If the procedure report does not answer your questions, please call your gastroenterologist to clarify.  If you requested that your care partner not be given the details of your procedure findings, then the procedure report has been included in a sealed envelope for you to review at your convenience later.  YOU SHOULD EXPECT: Some feelings of bloating in the abdomen. Passage of more gas than usual.  Walking can help get rid of the air that was put into your GI tract during the procedure and reduce the bloating. If you had a lower endoscopy (such as a colonoscopy or flexible sigmoidoscopy) you may notice spotting of blood in your stool or on the toilet paper. If you underwent a bowel prep for your procedure, you may not have a normal bowel movement for a few days.  Please Note:  You might notice some irritation and congestion in your nose or some drainage.  This is from the oxygen used during your procedure.  There is no need for concern and it should clear up in a day or so.  SYMPTOMS TO REPORT IMMEDIATELY:    Following upper endoscopy (EGD)  Vomiting of blood or coffee ground material  New chest pain or pain under the shoulder blades  Painful or persistently difficult swallowing  New shortness of breath  Fever of 100F or higher  Black, tarry-looking stools  For urgent or emergent issues, a gastroenterologist can be reached at any hour by calling [phone].   DIET: Nothing by mouth for 1 hour. Then clear liquids for 2 hours.  Soft diet for the rest of today..  Drink plenty of fluids but you should avoid alcoholic beverages for 24 hours.  ACTIVITY:  You should plan to take it easy for the rest of today and you should NOT DRIVE or use heavy machinery until tomorrow (because of the sedation  medicines used during the test).    FOLLOW UP: Our staff will call the number listed on your records the next business day following your procedure to check on you and address any questions or concerns that you may have regarding the information given to you following your procedure. If we do not reach you, we will leave a message.  However, if you are feeling well and you are not experiencing any problems, there is no need to return our call.  We will assume that you have returned to your regular daily activities without incident.  If any biopsies were taken you will be contacted by phone or by letter within the next 1-3 weeks.  Please call us at [phone] if you have not heard about the biopsies in 3 weeks.    SIGNATURES/CONFIDENTIALITY: You and/or your care partner have signed paperwork which will be entered into your electronic medical record.  These signatures attest to the fact that that the information above on your After Visit Summary has been reviewed and is understood.  Full responsibility of the confidentiality of this discharge information lies with you and/or your care-partner.  Read all of the handouts given to you by your recovery room nurse today.

## 2016-07-26 NOTE — Progress Notes (Signed)
Pt's states no medical or surgical changes since previsit or office visit. 

## 2016-07-26 NOTE — Progress Notes (Signed)
Called to room to assist during endoscopic procedure.  Patient ID and intended procedure confirmed with present staff. Received instructions for my participation in the procedure from the performing physician.  

## 2016-07-26 NOTE — Progress Notes (Signed)
A/ox3 pleased with MAC, report to [employer]

## 2016-07-26 NOTE — Progress Notes (Signed)
Dental advisory given to patient 

## 2016-07-26 NOTE — Op Note (Signed)
[facility] Patient Name: Darryl Black Procedure Date: 07/26/2016 3:36 PM MRN: [id number] Endoscopist: Ladene Artist , MD Age: 51 Referring MD:  Date of Birth: 1965-08-17 Gender: Male Account #: [id number] Procedure:                Upper GI endoscopy Indications:              Dysphagia Medicines:                Monitored Anesthesia Care Procedure:                Pre-Anesthesia Assessment:                           - Prior to the procedure, a History and Physical                            was performed, and patient medications and                            allergies were reviewed. The patient's tolerance of                            previous anesthesia was also reviewed. The risks                            and benefits of the procedure and the sedation                            options and risks were discussed with the patient.                            All questions were answered, and informed consent                            was obtained. Prior Anticoagulants: The patient has                            taken no previous anticoagulant or antiplatelet                            agents. ASA Grade Assessment: II - A patient with                            mild systemic disease. After reviewing the risks                            and benefits, the patient was deemed in                            satisfactory condition to undergo the procedure.                           After obtaining informed consent, the endoscope was  passed under direct vision. Throughout the                            procedure, the patient's blood pressure, pulse, and                            oxygen saturations were monitored continuously. The                            Endoscope was introduced through the mouth, and                            advanced to the second part of duodenum. The upper                            GI endoscopy was accomplished without  difficulty.                            The patient tolerated the procedure well. Scope In: Scope Out: Findings:                 One moderate benign-appearing, intrinsic stenosis                            was found at the gastroesophageal junction. This                            measured 1.2 cm (inner diameter) and was traversed.                            A guidewire was placed and the scope was withdrawn.                            Dilations were performed with Savary dilators with                            mild resistance with each dilator at 13 mm, 14 mm                            and 15 mm.                           The exam of the esophagus was otherwise normal.                           A small hiatal hernia was present.                           The exam of the stomach was otherwise normal.                           The duodenal bulb and second portion of the  duodenum were normal. Complications:            No immediate complications. Estimated Blood Loss:     Estimated blood loss: none. Impression:               - Benign-appearing esophageal stenosis. Dilated.                           - Small hiatal hernia.                           - Normal duodenal bulb and second portion of the                            duodenum.                           - No specimens collected. Recommendation:           - The patient will be observed post-procedure,                            until all discharge criteria are met.                           - Clear liquid diet for 2 hours, then advance as                            tolerated to soft diet today. Resume prior diet                            tomorrow.                           - Continue present medications.                           - Return to GI office in 6 weeks.                           - Patient has a contact number available for                            emergencies. The signs and symptoms of potential                             delayed complications were discussed with the                            patient. Return to normal activities tomorrow.                            Written discharge instructions were provided to the                            patient. Ladene Artist, MD 07/26/2016 3:52:35 PM This report has been signed electronically.

## 2016-07-27 ENCOUNTER — Telehealth: Payer: Self-pay | Admitting: *Deleted

## 2016-07-27 NOTE — Telephone Encounter (Signed)
  Follow up Call-  Call back number 07/26/2016 02/09/2015  Post procedure Call Back phone  # [phone]  Permission to leave phone message Yes No  Some recent data might be hidden     Patient questions:  Do you have a fever, pain , or abdominal swelling? No. Pain Score  0 *  Have you tolerated food without any problems? Yes.    Have you been able to return to your normal activities? Yes.    Do you have any questions about your discharge instructions: Diet   No. Medications  No. Follow up visit  No.  Do you have questions or concerns about your Care? No.  Actions: * If pain score is 4 or above: No action needed, pain <4.

## 2016-07-27 NOTE — Telephone Encounter (Signed)
No answer, message left for the patient. 

## 2016-09-26 ENCOUNTER — Encounter: Payer: Self-pay | Admitting: Family Medicine

## 2016-09-26 ENCOUNTER — Ambulatory Visit (INDEPENDENT_AMBULATORY_CARE_PROVIDER_SITE_OTHER): Payer: 59 | Admitting: Family Medicine

## 2016-09-26 VITALS — BP 130/90 | HR 81 | Wt 184.0 lb

## 2016-09-26 DIAGNOSIS — M79605 Pain in left leg: Secondary | ICD-10-CM | POA: Diagnosis not present

## 2016-09-26 DIAGNOSIS — Z86718 Personal history of other venous thrombosis and embolism: Secondary | ICD-10-CM

## 2016-09-26 DIAGNOSIS — R030 Elevated blood-pressure reading, without diagnosis of hypertension: Secondary | ICD-10-CM

## 2016-09-26 NOTE — Progress Notes (Signed)
   Subjective:    Patient ID: Darryl Black, male    DOB: Aug 07, 1965, 51 y.o.   MRN: [id number]  HPI He is here for consult concerning 2 issues. The last 3 weeks he has noted an increase in his blood pressure. The numbers have been in the 140/90 range. 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 Also he has a previous history of DVT 2 years ago and is now over the last week having the same kinds of symptoms in his left leg. He describes them as a pressure sensation. He is not been sitting or traveling or had any injury to the leg.  Review of Systems     Objective:   Physical Exam Alert and in no distress. Blood pressure is recorded. Leg exam shows normal sensation with negative Homans test. No redness swelling or deformity.       Assessment & Plan:  Left leg pain - Plan: VAS [place] LOWER EXTREMITY VENOUS (DVT), CANCELED: US Venous Img Lower Unilateral Left  History of DVT in adulthood - Plan: VAS [place] LOWER EXTREMITY VENOUS (DVT)  Elevated blood pressure reading I will get a venous Doppler just to be on the safe side. Then discussed his blood pressure. Recommend that we continue to watch this over the next several weeks and he will keep track on this. If it remains elevated I will treat. Discussed diet exercise, decongestants. He does not smoke or drink. He does take excellent care of himself.

## 2016-09-27 ENCOUNTER — Ambulatory Visit (HOSPITAL_COMMUNITY)
Admission: RE | Admit: 2016-09-27 | Discharge: 2016-09-27 | Disposition: A | Discharge status: Home / Self Care | Payer: 59 | Source: Ambulatory Visit | Attending: Family Medicine | Admitting: Family Medicine

## 2016-09-27 DIAGNOSIS — Z86718 Personal history of other venous thrombosis and embolism: Secondary | ICD-10-CM

## 2016-09-27 DIAGNOSIS — M79605 Pain in left leg: Secondary | ICD-10-CM

## 2016-09-27 NOTE — Progress Notes (Signed)
VASCULAR LAB PRELIMINARY  PRELIMINARY  PRELIMINARY  PRELIMINARY  Left lower extremity venous duplex completed.    Preliminary report:  Left:  No evidence of DVT, superficial thrombosis, or Baker's cyst.  Zamorah Ailes, RVS 09/27/2016, 10:28 AM

## 2016-10-10 ENCOUNTER — Ambulatory Visit (INDEPENDENT_AMBULATORY_CARE_PROVIDER_SITE_OTHER): Payer: 59 | Admitting: Family Medicine

## 2016-10-10 ENCOUNTER — Encounter: Payer: Self-pay | Admitting: Family Medicine

## 2016-10-10 VITALS — BP 120/80 | HR 63 | Wt 188.2 lb

## 2016-10-10 DIAGNOSIS — A6001 Herpesviral infection of penis: Secondary | ICD-10-CM

## 2016-10-10 DIAGNOSIS — H9313 Tinnitus, bilateral: Secondary | ICD-10-CM | POA: Diagnosis not present

## 2016-10-10 LAB — TSH: TSH: 1.18 mIU/L (ref 0.40–4.50)

## 2016-10-10 NOTE — Patient Instructions (Signed)
You will receive a phone call from ENT.  We will call you with lab results.    Tinnitus Tinnitus refers to hearing a sound when there is no actual source for that sound. This is often described as ringing in the ears. However, people with this condition may hear a variety of noises. A person may hear the sound in one ear or in both ears. The sounds of tinnitus can be soft, loud, or somewhere in between. Tinnitus can last for a few seconds or can be constant for days. It may go away without treatment and come back at various times. When tinnitus is constant or happens often, it can lead to other problems, such as trouble sleeping and trouble concentrating. Almost everyone experiences tinnitus at some point. Tinnitus that is long-lasting (chronic) or comes back often is a problem that may require medical attention. What are the causes? The cause of tinnitus is often not known. In some cases, it can result from other problems or conditions, including:  Exposure to loud noises from machinery, music, or other sources.  Hearing loss.  Ear or sinus infections.  Earwax buildup.  A foreign object in the ear.  Use of certain medicines.  Use of alcohol and caffeine.  High blood pressure.  Heart diseases.  Anemia.  Allergies.  Meniere disease.  Thyroid problems.  Tumors.  An enlarged part of a weakened blood vessel (aneurysm).  What are the signs or symptoms? The main symptom of tinnitus is hearing a sound when there is no source for that sound. It may sound like:  Buzzing.  Roaring.  Ringing.  Blowing air, similar to the sound heard when you listen to a seashell.  Hissing.  Whistling.  Sizzling.  Humming.  Running water.  A sustained musical note.  How is this diagnosed? Tinnitus is diagnosed based on your symptoms. Your health care provider will do a physical exam. A comprehensive hearing exam (audiologic exam) will be done if your tinnitus:  Affects only one  ear (unilateral).  Causes hearing difficulties.  Lasts 6 months or longer.  You may also need to see a health care provider who specializes in hearing disorders (audiologist). You may be asked to complete a questionnaire to determine the severity of your tinnitus. Tests may be done to help determine the cause and to rule out other conditions. These can include:  Imaging studies of your head and brain, such as: ? A CT scan. ? An MRI.  An imaging study of your blood vessels (angiogram).  How is this treated? Treating an underlying medical condition can sometimes make tinnitus go away. If your tinnitus continues, other treatments may include:  Medicines, such as certain antidepressants or sleeping aids.  Sound generators to mask the tinnitus. These include: ? Tabletop sound machines that play relaxing sounds to help you fall asleep. ? Wearable devices that fit in your ear and play sounds or music. ? A small device that uses headphones to deliver a signal embedded in music (acoustic neural stimulation). In time, this may change the pathways of your brain and make you less sensitive to tinnitus. This device is used for very severe cases when no other treatment is working.  Therapy and counseling to help you manage the stress of living with tinnitus.  Using hearing aids or cochlear implants, if your tinnitus is related to hearing loss.  Follow these instructions at home:  When possible, avoid being in loud places and being exposed to loud sounds.  Wear hearing  protection, such as earplugs, when you are exposed to loud noises.  Do not take stimulants, such as nicotine, alcohol, or caffeine.  Practice techniques for reducing stress, such as meditation, yoga, or deep breathing.  Use a white noise machine, a humidifier, or other devices to mask the sound of tinnitus.  Sleep with your head slightly raised. This may reduce the impact of tinnitus.  Try to get plenty of rest each  night. Contact a health care provider if:  You have tinnitus in just one ear.  Your tinnitus continues for 3 weeks or longer without stopping.  Home care measures are not helping.  You have tinnitus after a head injury.  You have tinnitus along with any of the following: ? Dizziness. ? Loss of balance. ? Nausea and vomiting. This information is not intended to replace advice given to you by your health care provider. Make sure you discuss any questions you have with your health care provider. Document Released: 02/13/2005 Document Revised: 10/17/2015 Document Reviewed: 07/16/2013 Elsevier Interactive Patient Education  2018 [employer].

## 2016-10-10 NOTE — Progress Notes (Signed)
   Subjective:    Patient ID: Darryl Black, male    DOB: 09-07-1965, 51 y.o.   MRN: [id number]  HPI Chief Complaint  Patient presents with  . ringing in ear    ringing in ears. hearing high pinch tone. no pain   He is here with complaints of a 7-10 day history of having a high pitched noise in his ears. Reports constant ringing, non pulsating.  Denies dizziness. Nothing aggravates the symptoms or improves them.   Denies pain, trauma or dizziness.  No recent aspirin or NSAID use.  Denies fever, chills, headache, vision changes, chest pain, shortness of breath, numbness, tingling or weakness.   States he did a telemed visit earlier today and they advised him to see his PCP immediately. His PCP is out of the office today so he is seeing me.   He would also like to discuss that he has had a genital herpes outbreak for approximately 1 month. States he has been taking Valtrex and his symptoms improve and then worsen again. Denies any new sexual partners. No urinary symptoms.   Reviewed allergies, medications, past medical, surgical, and social history.   Review of Systems Pertinent positives and negatives in the history of present illness.     Objective:   Physical Exam  Constitutional: He is oriented to person, place, and time. He appears well-developed and well-nourished. No distress.  HENT:  Right Ear: Hearing, tympanic membrane and ear canal normal.  Left Ear: Hearing, tympanic membrane and ear canal normal.  Nose: Nose normal. Right sinus exhibits no maxillary sinus tenderness and no frontal sinus tenderness. Left sinus exhibits no maxillary sinus tenderness and no frontal sinus tenderness.  Mouth/Throat: Uvula is midline, oropharynx is clear and moist and mucous membranes are normal.  Eyes: Pupils are equal, round, and reactive to light. Conjunctivae and lids are normal.  Neck: Trachea normal and full passive range of motion without pain. Neck supple. No JVD present. Carotid bruit  is not present. No thyromegaly present.  Cardiovascular: Normal rate, regular rhythm and normal heart sounds.   Pulmonary/Chest: Effort normal and breath sounds normal.  Genitourinary:  Genitourinary Comments: Scaling noted to glans without any lesions or erythema.   Lymphadenopathy:    He has no cervical adenopathy.  Neurological: He is alert and oriented to person, place, and time. He has normal strength. He displays no tremor. No cranial nerve deficit or sensory deficit. Gait normal.  Skin: Skin is warm and dry. No pallor.  Psychiatric: He has a normal mood and affect. His speech is normal and behavior is normal. Thought content normal. Cognition and memory are normal.   BP 120/80   Pulse 63   Wt 188 lb 3.2 oz (85.4 kg)   BMI 27.79 kg/m       Assessment & Plan:  Tinnitus of both ears - Plan: Basic metabolic panel, TSH, Ambulatory referral to ENT  Herpes simplex infection of penis  Discussed that his neuro exam is normal and there is no obvious explanation for the tinnitus. No sign of hearing loss. Will check labs and refer him to ENT for further evaluation.  Will have him continue on Valtrex and follow up with Dr. Redmond School if his symptoms are not clearing up.

## 2016-10-11 LAB — BASIC METABOLIC PANEL
BUN: 14 mg/dL (ref 7–25)
CALCIUM: 9.6 mg/dL (ref 8.6–10.3)
CO2: 21 mmol/L (ref 20–32)
Chloride: 105 mmol/L (ref 98–110)
Creat: 1.64 mg/dL — ABNORMAL HIGH (ref 0.70–1.33)
GLUCOSE: 89 mg/dL (ref 65–99)
POTASSIUM: 4.1 mmol/L (ref 3.5–5.3)
SODIUM: 140 mmol/L (ref 135–146)

## 2016-10-16 ENCOUNTER — Ambulatory Visit (INDEPENDENT_AMBULATORY_CARE_PROVIDER_SITE_OTHER): Payer: 59 | Admitting: Family Medicine

## 2016-10-16 ENCOUNTER — Encounter: Payer: Self-pay | Admitting: Family Medicine

## 2016-10-16 VITALS — BP 118/70 | HR 78 | Wt 189.0 lb

## 2016-10-16 DIAGNOSIS — A6001 Herpesviral infection of penis: Secondary | ICD-10-CM | POA: Diagnosis not present

## 2016-10-16 DIAGNOSIS — H9313 Tinnitus, bilateral: Secondary | ICD-10-CM | POA: Diagnosis not present

## 2016-10-16 NOTE — Progress Notes (Signed)
   Subjective:    Patient ID: Darryl Black, male    DOB: [birth year], 51 y.o.   MRN: [id number]  HPI He is here for consult concerning continued difficulty with a penile lesion. It was previously diagnosed clinically as herpes genitalis which apparently did respond to normal dosing of Valtrex. Recently he has been taking Valtrex and did do a dosing schedule with no success. He also complains of continued difficulty with tinnitus and would like to be referred to see if anything can be done. I did explain that usually there is nothing to do for this other than white noise.   Review of Systems     Objective:   Physical Exam Alert and in no distress. Exam of the head of the penis does show several erythematous relatively flat lesions that appear slightly dry.       Assessment & Plan:  Tinnitus of both ears - Plan: Ambulatory referral to ENT  Herpes simplex infection of penis I will have him take the Valtrex 3 times a day for 1 week to see if this will help If no improvement, dermatology referral will be made.

## 2016-10-18 ENCOUNTER — Encounter: Payer: Self-pay | Admitting: Internal Medicine

## 2016-10-27 ENCOUNTER — Encounter (HOSPITAL_COMMUNITY): Payer: 59

## 2016-11-03 DIAGNOSIS — H9313 Tinnitus, bilateral: Secondary | ICD-10-CM | POA: Diagnosis not present

## 2017-01-03 ENCOUNTER — Other Ambulatory Visit: Payer: Self-pay | Admitting: Family Medicine

## 2017-01-03 NOTE — Telephone Encounter (Signed)
Is this okay to call in? 

## 2017-01-04 ENCOUNTER — Other Ambulatory Visit: Payer: Self-pay | Admitting: Family Medicine

## 2017-01-04 NOTE — Telephone Encounter (Signed)
Called in xanax 

## 2017-01-04 NOTE — Telephone Encounter (Signed)
ok 

## 2017-03-01 ENCOUNTER — Ambulatory Visit (INDEPENDENT_AMBULATORY_CARE_PROVIDER_SITE_OTHER): Payer: No Typology Code available for payment source | Admitting: Family Medicine

## 2017-03-01 ENCOUNTER — Encounter: Payer: Self-pay | Admitting: Family Medicine

## 2017-03-01 VITALS — BP 140/80 | HR 53 | Resp 16 | Wt 192.4 lb

## 2017-03-01 DIAGNOSIS — B20 Human immunodeficiency virus [HIV] disease: Secondary | ICD-10-CM

## 2017-03-01 DIAGNOSIS — E781 Pure hyperglyceridemia: Secondary | ICD-10-CM

## 2017-03-01 DIAGNOSIS — Z79899 Other long term (current) drug therapy: Secondary | ICD-10-CM | POA: Diagnosis not present

## 2017-03-01 MED ORDER — BICTEGRAVIR-EMTRICITAB-TENOFOV 50-200-25 MG PO TABS: 1.0000 | ORAL_TABLET | Freq: Every day | ORAL | 3 refills | Status: DC

## 2017-03-01 NOTE — Progress Notes (Signed)
   Subjective:    Patient ID: Darryl Black, male    DOB: 1965-07-09, 52 y.o.   MRN: [id number]  HPI He is here for his routine HIV care.  He has no particular concerns or complaints.  No fever, chills, headache, weight change, GI or GU issues.  His work and home life are going well.  He does not smoke or drink.   Review of Systems     Objective:   Physical Exam Alert and in no distress. Tympanic membranes and canals are normal. Pharyngeal area is normal. Neck is supple without adenopathy or thyromegaly. Cardiac exam shows a regular sinus rhythm without murmurs or gallops. Lungs are clear to auscultation.  Abdominal exam shows no hepatosplenomegaly, masses or tenderness.        Assessment & Plan:  HIV disease ([place]) - Plan: CBC with Differential/Platelet, Comprehensive metabolic panel, Lipid panel, HIV 1 RNA quant-no reflex-bld, T-helper cells (CD4) count (not at [facility]), bictegravir-emtricitabine-tenofovir AF (BIKTARVY) 50-200-25 MG TABS tablet  Hypertriglyceridemia - Plan: Lipid panel  Encounter for long-term (current) use of high-risk medication - Plan: CBC with Differential/Platelet, Comprehensive metabolic panel, Lipid panel, HIV 1 RNA quant-no reflex-bld, T-helper cells (CD4) count (not at [facility]) I discussed switching him to a different HIV medication with the idea of reducing potential side effects.  He is comfortable with this will therefore switch him to [employer].  A discount card was given.

## 2017-03-05 LAB — CBC WITH DIFFERENTIAL/PLATELET
BASOS PCT: 0.7 %
Basophils Absolute: 54 cells/uL (ref 0–200)
EOS ABS: 77 {cells}/uL (ref 15–500)
Eosinophils Relative: 1 %
HCT: 49.8 % (ref 38.5–50.0)
HEMOGLOBIN: 17.2 g/dL — AB (ref 13.2–17.1)
Lymphs Abs: 2957 cells/uL (ref 850–3900)
MCH: 33.2 pg — AB (ref 27.0–33.0)
MCHC: 34.5 g/dL (ref 32.0–36.0)
MCV: 96.1 fL (ref 80.0–100.0)
MPV: 10.7 fL (ref 7.5–12.5)
Monocytes Relative: 12.4 %
NEUTROS ABS: 3658 {cells}/uL (ref 1500–7800)
Neutrophils Relative %: 47.5 %
Platelets: 206 10*3/uL (ref 140–400)
RBC: 5.18 10*6/uL (ref 4.20–5.80)
RDW: 12.1 % (ref 11.0–15.0)
Total Lymphocyte: 38.4 %
WBC: 7.7 10*3/uL (ref 3.8–10.8)
WBCMIX: 955 {cells}/uL — AB (ref 200–950)

## 2017-03-05 LAB — COMPREHENSIVE METABOLIC PANEL
AG RATIO: 1.7 (calc) (ref 1.0–2.5)
ALKALINE PHOSPHATASE (APISO): 60 U/L (ref 40–115)
ALT: 53 U/L — ABNORMAL HIGH (ref 9–46)
AST: 20 U/L (ref 10–35)
Albumin: 4.5 g/dL (ref 3.6–5.1)
BUN / CREAT RATIO: 9 (calc) (ref 6–22)
BUN: 15 mg/dL (ref 7–25)
CHLORIDE: 100 mmol/L (ref 98–110)
CO2: 28 mmol/L (ref 20–32)
Calcium: 10 mg/dL (ref 8.6–10.3)
Creat: 1.71 mg/dL — ABNORMAL HIGH (ref 0.70–1.33)
GLOBULIN: 2.6 g/dL (ref 1.9–3.7)
Glucose, Bld: 109 mg/dL — ABNORMAL HIGH (ref 65–99)
Potassium: 4.3 mmol/L (ref 3.5–5.3)
Sodium: 135 mmol/L (ref 135–146)
Total Bilirubin: 1 mg/dL (ref 0.2–1.2)
Total Protein: 7.1 g/dL (ref 6.1–8.1)

## 2017-03-05 LAB — HIV-1 RNA QUANT-NO REFLEX-BLD
HIV 1 RNA Quant: <20 copies/mL — AB
HIV-1 RNA QUANT, LOG: DETECTED {Log_copies}/mL — AB

## 2017-03-05 LAB — LIPID PANEL
CHOLESTEROL: 190 mg/dL (ref <200)
HDL: 33 mg/dL — AB (ref >40)
LDL Cholesterol (Calc): 120 mg/dL (calc) — ABNORMAL HIGH
NON-HDL CHOLESTEROL (CALC): 157 mg/dL — AB (ref <130)
Total CHOL/HDL Ratio: 5.8 (calc) — ABNORMAL HIGH (ref <5.0)
Triglycerides: 261 mg/dL — ABNORMAL HIGH (ref <150)

## 2017-03-05 LAB — T-HELPER CELLS (CD4) COUNT (NOT AT ARMC)
ABSOLUTE CD4: 1093 {cells}/uL (ref 490–1740)
CD4 T HELPER %: 35 % (ref 30–61)
Total lymphocyte count: 3134 cells/uL (ref 850–3900)

## 2017-04-06 ENCOUNTER — Encounter: Payer: Self-pay | Admitting: Pharmacist

## 2017-04-06 ENCOUNTER — Telehealth: Payer: Self-pay | Admitting: Pharmacist

## 2017-04-06 NOTE — Telephone Encounter (Signed)
Called patient to schedule an appointment for the [facility]. I was unable to reach the patient and was unable to leave a message. Will send a MyChart message to patient.

## 2017-04-11 ENCOUNTER — Ambulatory Visit: Payer: Self-pay | Attending: Internal Medicine | Admitting: Pharmacist

## 2017-04-11 DIAGNOSIS — Z79899 Other long term (current) drug therapy: Secondary | ICD-10-CM

## 2017-04-11 NOTE — Progress Notes (Signed)
   S: Patient presents to [facility] for review of their specialty medication therapy.    Patient is currently taking Biktarvy for HIV. Patient is managed by Dr. Redmond School for this.   Adherence: denies any missed doses  Dosing:  HIV-1 infection: Oral: One tablet once daily.  Dosing: Renal Impairment  CrCl <30 mL/minute: Not recommended.  Dosing: Hepatic Impairment  Mild to moderate hepatic impairment (Child-Pugh class A-B): No dosage adjustment necessary; use with caution. Severe hepatic impairment (Child-Pugh class  C): Not recommended.  Drug-drug interactions: none  Monitoring: HIV RNA: see below CD4 count: see below S/sx of opportunistic infections: denies S/sx of renal toxicity: denies S/sx of lactic acidosis: denies S/sx of immune reconstitution syndrome: denies  Patient would like to know if he can get his omeprazole at a [facility] pharmacy for free if he has a prescription.  O:     Lab Results  Component Value Date   WBC 7.7 03/01/2017   HGB 17.2 (H) 03/01/2017   HCT 49.8 03/01/2017   MCV 96.1 03/01/2017   PLT 206 03/01/2017      Chemistry      Component Value Date/Time   NA 135 03/01/2017 1150   K 4.3 03/01/2017 1150   CL 100 03/01/2017 1150   CO2 28 03/01/2017 1150   BUN 15 03/01/2017 1150   CREATININE 1.71 (H) 03/01/2017 1150      Component Value Date/Time   CALCIUM 10.0 03/01/2017 1150   ALKPHOS 58 06/27/2016 0933   AST 20 03/01/2017 1150   ALT 53 (H) 03/01/2017 1150   BILITOT 1.0 03/01/2017 1150       Lab Results  Component Value Date   CD4TCELL 35 03/01/2017    Lab Results  Component Value Date   HIV1RNAQUANT <20 DETECTED (A) 03/01/2017     A/P: 1. Medication review: patient is on [place] for HIV. Reviewed the medication with the patient, including the following: Atripla is a combination medication used for the treatment of HIV. Common adverse effects include renal toxicity, headache, increased risk of lactic  acidosis, decreased bone density and immune reconstitution syndrome.  Adherence is crucial when using this drug to avoid mutations and resistance. No recommendations for changes. Provided patient and the free prescription drug list at [facility] - looks like pantoprazole would be no copay if he had a prescription for that.    Christella Hartigan, PharmD, BCPS, BCACP, [place] [facility] [phone]

## 2017-07-10 ENCOUNTER — Encounter: Payer: No Typology Code available for payment source | Admitting: Family Medicine

## 2017-07-18 ENCOUNTER — Other Ambulatory Visit: Payer: Self-pay | Admitting: Family Medicine

## 2017-07-18 DIAGNOSIS — E781 Pure hyperglyceridemia: Secondary | ICD-10-CM

## 2017-07-24 ENCOUNTER — Ambulatory Visit (INDEPENDENT_AMBULATORY_CARE_PROVIDER_SITE_OTHER): Payer: No Typology Code available for payment source | Admitting: Family Medicine

## 2017-07-24 ENCOUNTER — Encounter: Payer: Self-pay | Admitting: Family Medicine

## 2017-07-24 VITALS — BP 132/84 | HR 53 | Temp 97.9°F | Ht 69.0 in | Wt 190.2 lb

## 2017-07-24 DIAGNOSIS — Z79899 Other long term (current) drug therapy: Secondary | ICD-10-CM

## 2017-07-24 DIAGNOSIS — A6001 Herpesviral infection of penis: Secondary | ICD-10-CM | POA: Diagnosis not present

## 2017-07-24 DIAGNOSIS — Z8719 Personal history of other diseases of the digestive system: Secondary | ICD-10-CM

## 2017-07-24 DIAGNOSIS — K449 Diaphragmatic hernia without obstruction or gangrene: Secondary | ICD-10-CM

## 2017-07-24 DIAGNOSIS — Z0000 Encounter for general adult medical examination without abnormal findings: Secondary | ICD-10-CM | POA: Diagnosis not present

## 2017-07-24 DIAGNOSIS — E781 Pure hyperglyceridemia: Secondary | ICD-10-CM | POA: Diagnosis not present

## 2017-07-24 DIAGNOSIS — B20 Human immunodeficiency virus [HIV] disease: Secondary | ICD-10-CM

## 2017-07-24 MED ORDER — PANTOPRAZOLE SODIUM 40 MG PO TBEC: 40.0000 mg | DELAYED_RELEASE_TABLET | Freq: Every day | ORAL | 3 refills | Status: DC

## 2017-07-24 NOTE — Progress Notes (Signed)
   Subjective:    Patient ID: Darryl Black, male    DOB: 18-May-1965, 51 y.o.   MRN: [id number]  HPI He is here for complete examination.  He continues on his Biktarvy and is having no difficulty with that.  He takes this religiously.  Continues on fenofibrate and having no problems with that.  He does take Lovaza.  His insurance company wants him to switch to Protonix to help with his esophageal stricture and hiatus hernia symptoms.  He rarely uses his Xanax and has not had a herpes outbreak in quite some time.  His work and home life are going quite well.  Family and social history as well as health maintenance and immunizations was reviewed.   Review of Systems  All other systems reviewed and are negative.      Objective:   Physical Exam BP 132/84 (BP Location: Left Arm, Patient Position: Sitting)   Pulse (!) 53   Temp 97.9 F (36.6 C)   Ht 5\' 9"  (1.753 m)   Wt 190 lb 3.2 oz (86.3 kg)   SpO2 99%   BMI 28.09 kg/m   General Appearance:    Alert, cooperative, no distress, appears stated age  Head:    Normocephalic, without obvious abnormality, atraumatic  Eyes:    PERRL, conjunctiva/corneas clear, EOM's intact, fundi    benign  Ears:    Normal TM's and external ear canals  Nose:   Nares normal, mucosa normal, no drainage or sinus   tenderness  Throat:   Lips, mucosa, and tongue normal; teeth and gums normal  Neck:   Supple, no lymphadenopathy;  thyroid:  no   enlargement/tenderness/nodules; no carotid   bruit or JVD  Back:    Spine nontender, no curvature, ROM normal, no CVA     tenderness  Lungs:     Clear to auscultation bilaterally without wheezes, rales or     ronchi; respirations unlabored  Chest Wall:    No tenderness or deformity   Heart:    Regular rate and rhythm, S1 and S2 normal, no murmur, rub   or gallop     Abdomen:     Soft, non-tender, nondistended, normoactive bowel sounds,    no masses, no hepatosplenomegaly        Extremities:   No clubbing, cyanosis or  edema  Pulses:   2+ and symmetric all extremities  Skin:   Skin color, texture, turgor normal, no rashes or lesions  Lymph nodes:   Cervical, supraclavicular, and axillary nodes normal  Neurologic:   CNII-XII intact, normal strength, sensation and gait; reflexes 2+ and symmetric throughout          Psych:   Normal mood, affect, hygiene and grooming.          Assessment & Plan:  HIV disease ([place]) - Plan: CBC with Differential/Platelet, Comprehensive metabolic panel, HIV 1 RNA quant-no reflex-bld, T-helper cells (CD4) count (not at [facility])  Hypertriglyceridemia - Plan: Lipid panel  Encounter for long-term (current) use of high-risk medication - Plan: CBC with Differential/Platelet, Comprehensive metabolic panel, HIV 1 RNA quant-no reflex-bld, T-helper cells (CD4) count (not at [facility]), Lipid panel  Herpes simplex infection of penis  History of esophageal stricture - Plan: pantoprazole (PROTONIX) 40 MG tablet  HH (hiatus hernia) - Plan: pantoprazole (PROTONIX) 40 MG tablet

## 2017-07-25 LAB — T-HELPER CELLS (CD4) COUNT (NOT AT ARMC)
% CD 4 POS. LYMPH.: 31.5 % (ref 30.8–58.5)
ABSOLUTE CD 4 HELPER: 1166 /uL (ref 359–1519)
BASOS: 0 %
Basophils Absolute: 0 10*3/uL (ref 0.0–0.2)
EOS (ABSOLUTE): 0.1 10*3/uL (ref 0.0–0.4)
Eos: 2 %
HEMOGLOBIN: 15.9 g/dL (ref 13.0–17.7)
Hematocrit: 47.2 % (ref 37.5–51.0)
IMMATURE GRANULOCYTES: 0 %
Immature Grans (Abs): 0 10*3/uL (ref 0.0–0.1)
Lymphocytes Absolute: 3.7 10*3/uL — ABNORMAL HIGH (ref 0.7–3.1)
Lymphs: 48 %
MCH: 33.2 pg — ABNORMAL HIGH (ref 26.6–33.0)
MCHC: 33.7 g/dL (ref 31.5–35.7)
MCV: 99 fL — ABNORMAL HIGH (ref 79–97)
MONOS ABS: 0.8 10*3/uL (ref 0.1–0.9)
Monocytes: 10 %
NEUTROS PCT: 40 %
Neutrophils Absolute: 3 10*3/uL (ref 1.4–7.0)
PLATELETS: 213 10*3/uL (ref 150–450)
RBC: 4.79 x10E6/uL (ref 4.14–5.80)
RDW: 13.4 % (ref 12.3–15.4)
WBC: 7.6 10*3/uL (ref 3.4–10.8)

## 2017-07-25 LAB — COMPREHENSIVE METABOLIC PANEL
ALT: 65 IU/L — ABNORMAL HIGH (ref 0–44)
AST: 32 IU/L (ref 0–40)
Albumin/Globulin Ratio: 1.7 (ref 1.2–2.2)
Albumin: 4.5 g/dL (ref 3.5–5.5)
Alkaline Phosphatase: 59 IU/L (ref 39–117)
BUN/Creatinine Ratio: 10 (ref 9–20)
BUN: 15 mg/dL (ref 6–24)
Bilirubin Total: 1.1 mg/dL (ref 0.0–1.2)
CALCIUM: 9.7 mg/dL (ref 8.7–10.2)
CO2: 24 mmol/L (ref 20–29)
CREATININE: 1.51 mg/dL — AB (ref 0.76–1.27)
Chloride: 102 mmol/L (ref 96–106)
GFR calc Af Amer: 60 mL/min/{1.73_m2} (ref >59)
GFR, EST NON AFRICAN AMERICAN: 52 mL/min/{1.73_m2} — AB (ref >59)
GLUCOSE: 97 mg/dL (ref 65–99)
Globulin, Total: 2.7 g/dL (ref 1.5–4.5)
POTASSIUM: 4.3 mmol/L (ref 3.5–5.2)
Sodium: 141 mmol/L (ref 134–144)
TOTAL PROTEIN: 7.2 g/dL (ref 6.0–8.5)

## 2017-07-25 LAB — HIV-1 RNA QUANT-NO REFLEX-BLD: HIV-1 RNA Viral Load: <20 copies/mL

## 2017-07-25 LAB — LIPID PANEL
Chol/HDL Ratio: 4.7 ratio (ref 0.0–5.0)
Cholesterol, Total: 177 mg/dL (ref 100–199)
HDL: 38 mg/dL — AB (ref >39)
LDL Calculated: 106 mg/dL — ABNORMAL HIGH (ref 0–99)
Triglycerides: 166 mg/dL — ABNORMAL HIGH (ref 0–149)
VLDL Cholesterol Cal: 33 mg/dL (ref 5–40)

## 2017-09-12 ENCOUNTER — Other Ambulatory Visit: Payer: Self-pay | Admitting: Family Medicine

## 2017-09-12 NOTE — Telephone Encounter (Signed)
Is this okay to refill? 

## 2017-09-28 ENCOUNTER — Other Ambulatory Visit: Payer: Self-pay | Admitting: Pharmacist

## 2017-09-28 DIAGNOSIS — B20 Human immunodeficiency virus [HIV] disease: Secondary | ICD-10-CM

## 2017-09-28 MED ORDER — BICTEGRAVIR-EMTRICITAB-TENOFOV 50-200-25 MG PO TABS: 1.0000 | ORAL_TABLET | Freq: Every day | ORAL | 2 refills | Status: DC

## 2017-10-22 ENCOUNTER — Other Ambulatory Visit: Payer: Self-pay | Admitting: Family Medicine

## 2017-10-22 DIAGNOSIS — E781 Pure hyperglyceridemia: Secondary | ICD-10-CM

## 2017-11-22 ENCOUNTER — Other Ambulatory Visit: Payer: Self-pay | Admitting: Medical

## 2017-11-22 DIAGNOSIS — B001 Herpesviral vesicular dermatitis: Secondary | ICD-10-CM

## 2017-11-22 DIAGNOSIS — A6002 Herpesviral infection of other male genital organs: Secondary | ICD-10-CM

## 2017-11-22 NOTE — Telephone Encounter (Signed)
Is this ok to refill?  

## 2018-01-29 ENCOUNTER — Ambulatory Visit (INDEPENDENT_AMBULATORY_CARE_PROVIDER_SITE_OTHER): Payer: No Typology Code available for payment source | Admitting: Family Medicine

## 2018-01-29 VITALS — BP 120/82 | HR 62 | Wt 192.0 lb

## 2018-01-29 DIAGNOSIS — E781 Pure hyperglyceridemia: Secondary | ICD-10-CM

## 2018-01-29 DIAGNOSIS — B20 Human immunodeficiency virus [HIV] disease: Secondary | ICD-10-CM

## 2018-01-29 DIAGNOSIS — Z79899 Other long term (current) drug therapy: Secondary | ICD-10-CM | POA: Diagnosis not present

## 2018-01-29 MED ORDER — BICTEGRAVIR-EMTRICITAB-TENOFOV 50-200-25 MG PO TABS: 1.0000 | ORAL_TABLET | Freq: Every day | ORAL | 3 refills | Status: DC

## 2018-01-29 NOTE — Progress Notes (Signed)
   Subjective:    Patient ID: [name], male    DOB: 03/31/1965, 52 y.o.   MRN: [id number]  HPI He is here for follow-up on his HIV.  He continues on his Biktarvy and is having no difficulty with that.  He is also on fenofibrate as well as Lovaza and having no difficulty with him.  He is in no fever, chills, nausea, vomiting, weight change.  His work and home life are going quite well.  He does not smoke or drink.  His immunizations are up-to-date.  Review of Systems  All other systems reviewed and are negative.      Objective:   Physical Exam Alert and in no distress. Tympanic membranes and canals are normal. Pharyngeal area is normal. Neck is supple without adenopathy or thyromegaly. Cardiac exam shows a regular sinus rhythm without murmurs or gallops. Lungs are clear to auscultation.  Abdominal exam shows no masses or tenderness with normal bowel sounds.        Assessment & Plan:  Hypertriglyceridemia - Plan: Lipid panel  HIV disease ([place]) - Plan: CBC with Differential/Platelet, Comprehensive metabolic panel, Lipid panel, T-helper cells (CD4) count (not at [facility]), HIV-1 RNA quant-no reflex-bld  Encounter for long-term (current) use of high-risk medication - Plan: CBC with Differential/Platelet, Comprehensive metabolic panel, Lipid panel, T-helper cells (CD4) count (not at [facility]), HIV-1 RNA quant-no reflex-bld

## 2018-01-30 ENCOUNTER — Other Ambulatory Visit: Payer: Self-pay | Admitting: Pharmacist

## 2018-01-30 DIAGNOSIS — B20 Human immunodeficiency virus [HIV] disease: Secondary | ICD-10-CM

## 2018-01-30 MED ORDER — BICTEGRAVIR-EMTRICITAB-TENOFOV 50-200-25 MG PO TABS: 1.0000 | ORAL_TABLET | Freq: Every day | ORAL | 3 refills | Status: DC

## 2018-01-31 LAB — T-HELPER CELLS (CD4) COUNT (NOT AT ARMC)
% CD 4 Pos. Lymph.: 36.7 % (ref 30.8–58.5)
Absolute CD 4 Helper: 1505 /uL (ref 359–1519)
Basophils Absolute: 0.1 10*3/uL (ref 0.0–0.2)
Basos: 1 %
EOS (ABSOLUTE): 0.1 10*3/uL (ref 0.0–0.4)
EOS: 2 %
Hematocrit: 48.4 % (ref 37.5–51.0)
Hemoglobin: 17 g/dL (ref 13.0–17.7)
Immature Grans (Abs): 0 10*3/uL (ref 0.0–0.1)
Immature Granulocytes: 0 %
Lymphocytes Absolute: 4.1 10*3/uL — ABNORMAL HIGH (ref 0.7–3.1)
Lymphs: 51 %
MCH: 34.6 pg — ABNORMAL HIGH (ref 26.6–33.0)
MCHC: 35.1 g/dL (ref 31.5–35.7)
MCV: 98 fL — ABNORMAL HIGH (ref 79–97)
MONOCYTES: 13 %
Monocytes Absolute: 1 10*3/uL — ABNORMAL HIGH (ref 0.1–0.9)
Neutrophils Absolute: 2.6 10*3/uL (ref 1.4–7.0)
Neutrophils: 33 %
Platelets: 231 10*3/uL (ref 150–450)
RBC: 4.92 x10E6/uL (ref 4.14–5.80)
RDW: 13 % (ref 12.3–15.4)
WBC: 8 10*3/uL (ref 3.4–10.8)

## 2018-01-31 LAB — LIPID PANEL
CHOLESTEROL TOTAL: 196 mg/dL (ref 100–199)
Chol/HDL Ratio: 4.9 ratio (ref 0.0–5.0)
HDL: 40 mg/dL (ref >39)
LDL Calculated: 125 mg/dL — ABNORMAL HIGH (ref 0–99)
Triglycerides: 153 mg/dL — ABNORMAL HIGH (ref 0–149)
VLDL Cholesterol Cal: 31 mg/dL (ref 5–40)

## 2018-01-31 LAB — COMPREHENSIVE METABOLIC PANEL
A/G RATIO: 1.9 (ref 1.2–2.2)
ALT: 69 IU/L — AB (ref 0–44)
AST: 33 IU/L (ref 0–40)
Albumin: 4.6 g/dL (ref 3.5–5.5)
Alkaline Phosphatase: 64 IU/L (ref 39–117)
BUN/Creatinine Ratio: 8 — ABNORMAL LOW (ref 9–20)
BUN: 13 mg/dL (ref 6–24)
Bilirubin Total: 0.8 mg/dL (ref 0.0–1.2)
CHLORIDE: 100 mmol/L (ref 96–106)
CO2: 23 mmol/L (ref 20–29)
Calcium: 10 mg/dL (ref 8.7–10.2)
Creatinine, Ser: 1.63 mg/dL — ABNORMAL HIGH (ref 0.76–1.27)
GFR calc Af Amer: 55 mL/min/{1.73_m2} — ABNORMAL LOW (ref >59)
GFR calc non Af Amer: 47 mL/min/{1.73_m2} — ABNORMAL LOW (ref >59)
GLOBULIN, TOTAL: 2.4 g/dL (ref 1.5–4.5)
Glucose: 106 mg/dL — ABNORMAL HIGH (ref 65–99)
POTASSIUM: 4.5 mmol/L (ref 3.5–5.2)
SODIUM: 138 mmol/L (ref 134–144)
Total Protein: 7 g/dL (ref 6.0–8.5)

## 2018-01-31 LAB — HIV-1 RNA QUANT-NO REFLEX-BLD: HIV-1 RNA Viral Load: <20 copies/mL

## 2018-03-14 ENCOUNTER — Encounter: Payer: Self-pay | Admitting: Family Medicine

## 2018-03-14 ENCOUNTER — Ambulatory Visit (INDEPENDENT_AMBULATORY_CARE_PROVIDER_SITE_OTHER): Payer: No Typology Code available for payment source | Admitting: Family Medicine

## 2018-03-14 ENCOUNTER — Other Ambulatory Visit: Payer: Self-pay | Admitting: Family Medicine

## 2018-03-14 VITALS — BP 126/80 | HR 60 | Temp 97.8°F | Wt 196.0 lb

## 2018-03-14 DIAGNOSIS — L989 Disorder of the skin and subcutaneous tissue, unspecified: Secondary | ICD-10-CM | POA: Diagnosis not present

## 2018-03-14 HISTORY — PX: SKIN BIOPSY: SHX1

## 2018-03-14 MED ORDER — LIDOCAINE-EPINEPHRINE (PF) 1 %-1:200000 IJ SOLN
10.0000 mL | Freq: Once | INTRAMUSCULAR | Status: AC
  Administered 2018-03-14: 10 mL via INTRADERMAL

## 2018-03-14 NOTE — Progress Notes (Signed)
   Subjective:    Patient ID: Darryl Black, male    DOB: 07-Feb-1966, 53 y.o.   MRN: [id number]  HPI He is here for evaluation of possible excision of a lesion on the right temple area.  Presently is not causing any trouble but apparently has grown slightly.   Review of Systems     Objective:   Physical Exam A 1/2 cm round smooth nonpigmented lesion is noted with well-demarcated borders.      Assessment & Plan:  Facial lesion The lesion was injected with Xylocaine and epinephrine.  A shave excision was done with removal of a lesion.  The base was hyfrecated.

## 2018-03-14 NOTE — Addendum Note (Signed)
Addended by: Elyse Jarvis on: 03/14/2018 12:39 PM   Modules accepted: Orders

## 2018-03-21 ENCOUNTER — Encounter: Payer: Self-pay | Admitting: Family Medicine

## 2018-04-22 ENCOUNTER — Other Ambulatory Visit: Payer: Self-pay | Admitting: Medical

## 2018-04-22 NOTE — Telephone Encounter (Signed)
Is this ok to refill?  

## 2018-04-22 NOTE — Telephone Encounter (Signed)
yours

## 2018-04-23 ENCOUNTER — Encounter: Payer: Self-pay | Admitting: Family Medicine

## 2018-04-23 MED ORDER — ZOLPIDEM TARTRATE 10 MG PO TABS: 5.0000 mg | ORAL_TABLET | Freq: Every evening | ORAL | 0 refills | Status: DC | PRN

## 2018-04-23 NOTE — Addendum Note (Signed)
Addended by: Denita Lung on: 04/23/2018 09:00 AM   Modules accepted: Orders

## 2018-05-02 ENCOUNTER — Other Ambulatory Visit: Payer: Self-pay | Admitting: Family Medicine

## 2018-05-02 DIAGNOSIS — E781 Pure hyperglyceridemia: Secondary | ICD-10-CM

## 2018-05-28 ENCOUNTER — Other Ambulatory Visit: Payer: Self-pay | Admitting: Family Medicine

## 2018-05-28 DIAGNOSIS — B001 Herpesviral vesicular dermatitis: Secondary | ICD-10-CM

## 2018-05-28 DIAGNOSIS — E781 Pure hyperglyceridemia: Secondary | ICD-10-CM

## 2018-05-28 NOTE — Telephone Encounter (Signed)
[place] [facility] is requesting to fil pt valtrex and ambien. Please advise [facility]

## 2018-05-29 ENCOUNTER — Other Ambulatory Visit: Payer: Self-pay

## 2018-05-30 ENCOUNTER — Encounter: Payer: Self-pay | Admitting: Family Medicine

## 2018-05-30 ENCOUNTER — Ambulatory Visit (INDEPENDENT_AMBULATORY_CARE_PROVIDER_SITE_OTHER): Payer: No Typology Code available for payment source | Admitting: Family Medicine

## 2018-05-30 ENCOUNTER — Other Ambulatory Visit: Payer: Self-pay

## 2018-05-30 VITALS — BP 137/100 | HR 77 | Wt 190.0 lb

## 2018-05-30 DIAGNOSIS — Z136 Encounter for screening for cardiovascular disorders: Secondary | ICD-10-CM

## 2018-05-30 NOTE — Progress Notes (Signed)
Documentation for virtual telephone encounter.  Documentation for virtual audio and video telecommunications through Zoom encounter:   The patient was located at home. The provider was located in the office. The patient did consent to this visit and is aware of possible charges through their insurance for this visit.  The other persons participating in this telemedicine service were none.  This virtual service is not related to other E/M service within previous 7 days.    Subjective:    Patient ID: Darryl Black, male    DOB: 1965/03/02, 53 y.o.   MRN: [id number]  HPI He has concerns over possible elevated blood pressure.  Apparently he has had a few episodes of dizziness and feeling not his normal self and therefore started checking his blood pressure.  He has seen some diastolic readings in the 734K.  Review of his record indicates he has had no previous difficulty with blood pressure.   Review of Systems     Objective:   Physical Exam Alert and in no distress otherwise not examined       Assessment & Plan:  Screening for hypertension I discussed hypertension with him in regard to checking his blood pressure at home.  Explained that he needs to check it in the resting, sitting position with arm at heart level. Recommend he check this once per week and with his next visit here, bring the readings in. I then discussed the symptoms of high blood pressure in regard to stroke, heart failure and kidney failure and explained that this is a long-term effect of untreated hypertension for years and years rather than days or weeks.  He expressed understanding of this.

## 2018-07-11 ENCOUNTER — Other Ambulatory Visit: Payer: Self-pay

## 2018-07-11 ENCOUNTER — Ambulatory Visit (INDEPENDENT_AMBULATORY_CARE_PROVIDER_SITE_OTHER): Payer: Self-pay | Admitting: Pharmacist

## 2018-07-11 DIAGNOSIS — Z79899 Other long term (current) drug therapy: Secondary | ICD-10-CM

## 2018-07-11 NOTE — Progress Notes (Signed)
   S: Patient presents virtually for review of their specialty medication therapy.    Patient is currently taking Biktarvy for HIV. Patient is managed by Dr. Redmond School for this.  He reports that he has been doing well and denies any adverse effects from the [place].   Adherence: denies any missed doses  Dosing:  HIV-1 infection: Oral: One tablet once daily.  Dosing: Renal Impairment  CrCl <30 mL/minute: Not recommended.  Dosing: Hepatic Impairment  Mild to moderate hepatic impairment (Child-Pugh class A-B): No dosage adjustment necessary; use with caution. Severe hepatic impairment (Child-Pugh class  C): Not recommended.  Drug-drug interactions: none  Monitoring: HIV RNA: see below CD4 count: see below S/sx of opportunistic infections: denies S/sx of renal toxicity: denies S/sx of lactic acidosis: denies S/sx of immune reconstitution syndrome: denies   O:     Lab Results  Component Value Date   WBC 8.0 01/29/2018   HGB 17.0 01/29/2018   HCT 48.4 01/29/2018   MCV 98 (H) 01/29/2018   PLT 231 01/29/2018      Chemistry      Component Value Date/Time   NA 138 01/29/2018 0945   K 4.5 01/29/2018 0945   CL 100 01/29/2018 0945   CO2 23 01/29/2018 0945   BUN 13 01/29/2018 0945   CREATININE 1.63 (H) 01/29/2018 0945   CREATININE 1.71 (H) 03/01/2017 1150      Component Value Date/Time   CALCIUM 10.0 01/29/2018 0945   ALKPHOS 64 01/29/2018 0945   AST 33 01/29/2018 0945   ALT 69 (H) 01/29/2018 0945   BILITOT 0.8 01/29/2018 0945       Lab Results  Component Value Date   CD4TCELL 35 03/01/2017    Lab Results  Component Value Date   HIV1RNAQUANT <20 DETECTED (A) 03/01/2017     A/P: 1. Medication review: patient is on [place] for HIV and continues to tolerate it well with no adverse effects. Reviewed the medication with the patient, including the following: [place] is a combination medication used for the treatment of HIV. Common adverse effects include renal  toxicity, headache, increased risk of lactic acidosis, decreased bone density and immune reconstitution syndrome.  Adherence is crucial when using this drug to avoid mutations and resistance. No recommendations for changes.   Christella Hartigan, PharmD, BCPS, BCACP, CPP Clinical Pharmacist Practitioner  [phone]

## 2018-07-23 ENCOUNTER — Other Ambulatory Visit: Payer: Self-pay | Admitting: Family Medicine

## 2018-07-23 DIAGNOSIS — E781 Pure hyperglyceridemia: Secondary | ICD-10-CM

## 2018-07-27 ENCOUNTER — Other Ambulatory Visit: Payer: Self-pay | Admitting: Family Medicine

## 2018-07-27 DIAGNOSIS — E781 Pure hyperglyceridemia: Secondary | ICD-10-CM

## 2019-01-21 ENCOUNTER — Other Ambulatory Visit: Payer: Self-pay | Admitting: Internal Medicine

## 2019-01-21 DIAGNOSIS — B20 Human immunodeficiency virus [HIV] disease: Secondary | ICD-10-CM

## 2019-01-24 ENCOUNTER — Other Ambulatory Visit: Payer: Self-pay | Admitting: Internal Medicine

## 2019-01-24 DIAGNOSIS — B20 Human immunodeficiency virus [HIV] disease: Secondary | ICD-10-CM

## 2019-01-27 ENCOUNTER — Telehealth: Payer: Self-pay

## 2019-01-27 ENCOUNTER — Other Ambulatory Visit: Payer: Self-pay | Admitting: Pharmacist

## 2019-01-27 DIAGNOSIS — B20 Human immunodeficiency virus [HIV] disease: Secondary | ICD-10-CM

## 2019-01-27 MED ORDER — BIKTARVY 50-200-25 MG PO TABS: 1.0000 | ORAL_TABLET | Freq: Every day | ORAL | 3 refills | Status: DC

## 2019-01-27 NOTE — Telephone Encounter (Signed)
Received fax from [facility] for a refill on [place] #[id number] with 3 refills pt. Last seen  05/30/18

## 2019-01-28 ENCOUNTER — Ambulatory Visit (INDEPENDENT_AMBULATORY_CARE_PROVIDER_SITE_OTHER): Payer: No Typology Code available for payment source | Admitting: Family Medicine

## 2019-01-28 ENCOUNTER — Encounter: Payer: Self-pay | Admitting: Family Medicine

## 2019-01-28 ENCOUNTER — Telehealth: Payer: Self-pay | Admitting: Family Medicine

## 2019-01-28 ENCOUNTER — Other Ambulatory Visit: Payer: Self-pay

## 2019-01-28 VITALS — BP 146/100 | HR 67 | Temp 96.6°F | Wt 191.8 lb

## 2019-01-28 DIAGNOSIS — B20 Human immunodeficiency virus [HIV] disease: Secondary | ICD-10-CM

## 2019-01-28 DIAGNOSIS — Z87442 Personal history of urinary calculi: Secondary | ICD-10-CM

## 2019-01-28 DIAGNOSIS — Z79899 Other long term (current) drug therapy: Secondary | ICD-10-CM

## 2019-01-28 DIAGNOSIS — Z8719 Personal history of other diseases of the digestive system: Secondary | ICD-10-CM | POA: Diagnosis not present

## 2019-01-28 DIAGNOSIS — N1831 Chronic kidney disease, stage 3a: Secondary | ICD-10-CM

## 2019-01-28 DIAGNOSIS — R7303 Prediabetes: Secondary | ICD-10-CM

## 2019-01-28 DIAGNOSIS — Z8619 Personal history of other infectious and parasitic diseases: Secondary | ICD-10-CM

## 2019-01-28 DIAGNOSIS — I10 Essential (primary) hypertension: Secondary | ICD-10-CM

## 2019-01-28 DIAGNOSIS — E781 Pure hyperglyceridemia: Secondary | ICD-10-CM | POA: Diagnosis not present

## 2019-01-28 MED ORDER — VASCEPA 1 G PO CAPS: 2.0000 g | ORAL_CAPSULE | Freq: Two times a day (BID) | ORAL | 3 refills | Status: DC

## 2019-01-28 MED ORDER — LOSARTAN POTASSIUM 25 MG PO TABS: 25.0000 mg | ORAL_TABLET | Freq: Every day | ORAL | 3 refills | Status: DC

## 2019-01-28 MED ORDER — FENOFIBRATE 160 MG PO TABS: 160.0000 mg | ORAL_TABLET | Freq: Every day | ORAL | 3 refills | Status: DC

## 2019-01-28 NOTE — Telephone Encounter (Signed)
Pt called and was wondering if you were going to call in a blood pressure medication or if the cholesterol pill was gonna help with that. He went to the pharmacy and only 1 medication was there

## 2019-01-28 NOTE — Telephone Encounter (Signed)
I called in Cozaar

## 2019-01-28 NOTE — Progress Notes (Signed)
   Subjective:    Patient ID: Darryl Black, male    DOB: Jun 10, 1965, 53 y.o.   MRN: [id number]  HPI He is here for an interval evaluation.  He continues on [place] and is having no difficulty with that.  He would also like to stop the Lovaza as he does not think that it is a good drug.  He does have a history of hypertriglyceridemia.  He continues on fenofibrate.  Has a previous history of kidney stones but is not had any recently.  Has not had to take any antiviral medicine for his herpes.  He continues on his H2 blocker for treatment of his esophageal stricture and does note an improvement in his overall symptoms.  His blood pressure recently has been elevated.  He does have a blood pressure cuff at home.  Family and social history as well as health maintenance and immunizations was reviewed.   Review of Systems     Objective:   Physical Exam Alert and in no distress. Tympanic membranes and canals are normal. Pharyngeal area is normal. Neck is supple without adenopathy or thyromegaly. Cardiac exam shows a regular sinus rhythm without murmurs or gallops. Lungs are clear to auscultation.        Assessment & Plan:  HIV disease ([place]) - Plan: CBC with Differential, Comprehensive metabolic panel, HIV-1 RNA quant-no reflex-bld, Lipid panel, T-helper cells (CD4) count  Hypertriglyceridemia - Plan: Lipid panel, icosapent Ethyl (VASCEPA) 1 g capsule, fenofibrate 160 MG tablet  Encounter for long-term (current) use of high-risk medication - Plan: CBC with Differential, Comprehensive metabolic panel, HIV-1 RNA quant-no reflex-bld, Lipid panel, T-helper cells (CD4) count  History of esophageal stricture  History of herpes genitalis  H/O herpes labialis  History of renal stone  Essential hypertension - Plan: losartan (COZAAR) 25 MG tablet In general he is doing quite well on his present medication regimen. I will add Cozaar to his regimen.  His blood pressure cuff was compared to ours and  is off slightly.  He will keep track of his blood pressure.  He was given proper instructions on how to check his pressure, will be checking it weekly and set up a virtual visit in 1 month.

## 2019-01-29 LAB — T-HELPER CELLS (CD4) COUNT (NOT AT ARMC)
% CD 4 Pos. Lymph.: 36.3 % (ref 30.8–58.5)
Absolute CD 4 Helper: 1379 /uL (ref 359–1519)
Basophils Absolute: 0.1 10*3/uL (ref 0.0–0.2)
Basos: 1 %
EOS (ABSOLUTE): 0.1 10*3/uL (ref 0.0–0.4)
Eos: 2 %
Hematocrit: 49.1 % (ref 37.5–51.0)
Hemoglobin: 17.2 g/dL (ref 13.0–17.7)
Immature Grans (Abs): 0 10*3/uL (ref 0.0–0.1)
Immature Granulocytes: 0 %
Lymphocytes Absolute: 3.8 10*3/uL — ABNORMAL HIGH (ref 0.7–3.1)
Lymphs: 49 %
MCH: 34 pg — ABNORMAL HIGH (ref 26.6–33.0)
MCHC: 35 g/dL (ref 31.5–35.7)
MCV: 97 fL (ref 79–97)
Monocytes Absolute: 1 10*3/uL — ABNORMAL HIGH (ref 0.1–0.9)
Monocytes: 13 %
Neutrophils Absolute: 2.7 10*3/uL (ref 1.4–7.0)
Neutrophils: 35 %
Platelets: 214 10*3/uL (ref 150–450)
RBC: 5.06 x10E6/uL (ref 4.14–5.80)
RDW: 12.3 % (ref 11.6–15.4)
WBC: 7.5 10*3/uL (ref 3.4–10.8)

## 2019-01-29 LAB — COMPREHENSIVE METABOLIC PANEL
ALT: 72 IU/L — ABNORMAL HIGH (ref 0–44)
AST: 27 IU/L (ref 0–40)
Albumin/Globulin Ratio: 1.5 (ref 1.2–2.2)
Albumin: 4.4 g/dL (ref 3.8–4.9)
Alkaline Phosphatase: 59 IU/L (ref 39–117)
BUN/Creatinine Ratio: 7 — ABNORMAL LOW (ref 9–20)
BUN: 12 mg/dL (ref 6–24)
Bilirubin Total: 1 mg/dL (ref 0.0–1.2)
CO2: 26 mmol/L (ref 20–29)
Calcium: 10.1 mg/dL (ref 8.7–10.2)
Chloride: 102 mmol/L (ref 96–106)
Creatinine, Ser: 1.61 mg/dL — ABNORMAL HIGH (ref 0.76–1.27)
GFR calc Af Amer: 55 mL/min/{1.73_m2} — ABNORMAL LOW (ref >59)
GFR calc non Af Amer: 48 mL/min/{1.73_m2} — ABNORMAL LOW (ref >59)
Globulin, Total: 2.9 g/dL (ref 1.5–4.5)
Glucose: 133 mg/dL — ABNORMAL HIGH (ref 65–99)
Potassium: 4.5 mmol/L (ref 3.5–5.2)
Sodium: 139 mmol/L (ref 134–144)
Total Protein: 7.3 g/dL (ref 6.0–8.5)

## 2019-01-29 LAB — LIPID PANEL
Chol/HDL Ratio: 5.8 ratio — ABNORMAL HIGH (ref 0.0–5.0)
Cholesterol, Total: 204 mg/dL — ABNORMAL HIGH (ref 100–199)
HDL: 35 mg/dL — ABNORMAL LOW (ref >39)
LDL Chol Calc (NIH): 130 mg/dL — ABNORMAL HIGH (ref 0–99)
Triglycerides: 215 mg/dL — ABNORMAL HIGH (ref 0–149)
VLDL Cholesterol Cal: 39 mg/dL (ref 5–40)

## 2019-01-29 LAB — HIV-1 RNA QUANT-NO REFLEX-BLD: HIV-1 RNA Viral Load: <20 copies/mL

## 2019-01-29 NOTE — Telephone Encounter (Signed)
lvm to advise pt b/p med was called. [place]

## 2019-01-30 DIAGNOSIS — N1831 Chronic kidney disease, stage 3a: Secondary | ICD-10-CM | POA: Insufficient documentation

## 2019-01-31 DIAGNOSIS — E119 Type 2 diabetes mellitus without complications: Secondary | ICD-10-CM | POA: Insufficient documentation

## 2019-01-31 DIAGNOSIS — R7303 Prediabetes: Secondary | ICD-10-CM | POA: Insufficient documentation

## 2019-02-05 LAB — SPECIMEN STATUS REPORT

## 2019-02-05 LAB — HGB A1C W/O EAG: Hgb A1c MFr Bld: 6.3 % — ABNORMAL HIGH (ref 4.8–5.6)

## 2019-03-04 ENCOUNTER — Encounter: Payer: Self-pay | Admitting: Family Medicine

## 2019-03-04 ENCOUNTER — Ambulatory Visit (INDEPENDENT_AMBULATORY_CARE_PROVIDER_SITE_OTHER): Payer: No Typology Code available for payment source | Admitting: Family Medicine

## 2019-03-04 ENCOUNTER — Other Ambulatory Visit: Payer: Self-pay

## 2019-03-04 VITALS — BP 124/87 | Wt 191.0 lb

## 2019-03-04 DIAGNOSIS — I10 Essential (primary) hypertension: Secondary | ICD-10-CM

## 2019-03-04 MED ORDER — LOSARTAN POTASSIUM 50 MG PO TABS: 50.0000 mg | ORAL_TABLET | Freq: Every day | ORAL | 3 refills | Status: DC

## 2019-03-04 NOTE — Progress Notes (Signed)
   Subjective:    Patient ID: Darryl Black, male    DOB: 19-May-1965, 54 y.o.   MRN: [id number]  HPI Documentation for virtual telephone encounter.  Documentation for virtual audio and video telecommunications through [place] encounter: The patient was located at home. The provider was located in the office. The patient did consent to this visit and is aware of possible charges through their insurance for this visit. The other persons participating in this telemedicine service were none.  This virtual service is not related to other E/M service within previous 7 days. He has been checking his blood pressure at home.  The systolic numbers look good however the diastolic numbers are in the mid to high 80 range.   Review of Systems     Objective:   Physical Exam Alert and in no distress otherwise not examined       Assessment & Plan:  Essential hypertension - Plan: losartan (COZAAR) 50 MG tablet I will increase his Cozaar to 50 mg.  He is to set up another virtual visit in about 1 month.  He was comfortable with that.

## 2019-03-25 ENCOUNTER — Other Ambulatory Visit: Payer: Self-pay | Admitting: Family Medicine

## 2019-03-25 DIAGNOSIS — Z8719 Personal history of other diseases of the digestive system: Secondary | ICD-10-CM

## 2019-03-25 DIAGNOSIS — K449 Diaphragmatic hernia without obstruction or gangrene: Secondary | ICD-10-CM

## 2019-04-08 ENCOUNTER — Ambulatory Visit: Payer: No Typology Code available for payment source | Admitting: Family Medicine

## 2019-04-09 ENCOUNTER — Encounter: Payer: Self-pay | Admitting: Family Medicine

## 2019-04-09 ENCOUNTER — Other Ambulatory Visit: Payer: Self-pay

## 2019-04-09 ENCOUNTER — Ambulatory Visit (INDEPENDENT_AMBULATORY_CARE_PROVIDER_SITE_OTHER): Payer: No Typology Code available for payment source | Admitting: Family Medicine

## 2019-04-09 VITALS — BP 110/74 | HR 87 | Temp 97.3°F | Wt 185.2 lb

## 2019-04-09 DIAGNOSIS — F5102 Adjustment insomnia: Secondary | ICD-10-CM | POA: Diagnosis not present

## 2019-04-09 DIAGNOSIS — R000 Tachycardia, unspecified: Secondary | ICD-10-CM

## 2019-04-09 DIAGNOSIS — Z8719 Personal history of other diseases of the digestive system: Secondary | ICD-10-CM | POA: Diagnosis not present

## 2019-04-09 DIAGNOSIS — Z79899 Other long term (current) drug therapy: Secondary | ICD-10-CM

## 2019-04-09 DIAGNOSIS — I10 Essential (primary) hypertension: Secondary | ICD-10-CM

## 2019-04-09 MED ORDER — PANTOPRAZOLE SODIUM 40 MG PO TBEC: 40.0000 mg | DELAYED_RELEASE_TABLET | Freq: Every day | ORAL | 3 refills | Status: DC

## 2019-04-09 MED ORDER — ZOLPIDEM TARTRATE 10 MG PO TABS: 5.0000 mg | ORAL_TABLET | Freq: Every evening | ORAL | 1 refills | Status: DC | PRN

## 2019-04-09 NOTE — Progress Notes (Signed)
   Subjective:    Patient ID: Darryl Black, male    DOB: [birth year], 54 y.o.   MRN: [id number]  HPI He is here for a blood pressure recheck.  He also has concerns over his heart rate.  He is also had some chest pressure but not necessarily related to his heart rate.  He notes that it time his heart rate can be up around 100 when he is sedentary.  It can also be when he is physically active.  He has noted that his heart rate with normal exercise is higher than it normally is.  He is measured at around 100.  The chest pain can be at any time and not necessarily associated with physical activity.  No associated shortness of breath PND or DOE. He also needs a refill on his pantoprazole.  He does have underlying esophageal stricture as well as hiatus hernia.  He does work third shift and does need to use Ambien to help with shift work sleep insomnia. He continues on his age IV meds without difficulty. Review of Systems     Objective:   Physical Exam Alert and in no distress.  Cardiac exam shows regular rhythm without murmurs gallops.  Lungs clear to auscultation.  EKG shows a sinus rhythm.  PR interval normal.       Assessment & Plan:  History of esophageal stricture - Plan: pantoprazole (PROTONIX) 40 MG tablet  Transient insomnia of non-organic origin - Plan: zolpidem (AMBIEN) 10 MG tablet  Essential hypertension  Encounter for long-term (current) use of high-risk medication  Tachycardia - Plan: EKG 12-Lead, Cardiac event monitor I will get a event monitor to evaluate the tachycardia.  Explained that we will need to be referred for further evaluation based on the event monitor.  He was comfortable with that.

## 2019-04-10 ENCOUNTER — Encounter: Payer: Self-pay | Admitting: *Deleted

## 2019-04-10 NOTE — Progress Notes (Signed)
Patient ID: Darryl Black, male   DOB: 1965/06/06, 54 y.o.   MRN: [id number] Patient enrolled for [employer] to ship a 30 day cardiac event monitor to his home.  Notification and instructions sent via My Chart message. Patient called.

## 2019-04-17 ENCOUNTER — Ambulatory Visit (INDEPENDENT_AMBULATORY_CARE_PROVIDER_SITE_OTHER)
Admission: RE | Admit: 2019-04-17 | Discharge: 2019-04-17 | Disposition: A | Discharge status: Home / Self Care | Payer: No Typology Code available for payment source | Source: Ambulatory Visit

## 2019-04-17 ENCOUNTER — Telehealth: Payer: Self-pay

## 2019-04-17 ENCOUNTER — Other Ambulatory Visit: Payer: Self-pay

## 2019-04-17 DIAGNOSIS — R509 Fever, unspecified: Secondary | ICD-10-CM | POA: Diagnosis not present

## 2019-04-17 NOTE — ED Provider Notes (Signed)
Virtual Visit via Video Note:  Darryl Black  initiated request for Telemedicine visit with [facility] team. I connected with Darryl Black  on 04/17/2019 at 12:29 PM  for a synchronized telemedicine visit using a video enabled HIPPA compliant telemedicine application. I verified that I am speaking with Darryl Black  using two identifiers. Tanzania Hall-Potvin, PA-C  was physically located in a [place] [facility] site and Darryl Black was located at a different location.   The limitations of evaluation and management by telemedicine as well as the availability of in-person appointments were discussed. Patient was informed that he  may incur a bill ( including co-pay) for this virtual visit encounter. Darryl Black  expressed understanding and gave verbal consent to proceed with virtual visit.     History of Present Illness:Darryl Black  is a 54 y.o. male with history of HIV presents with concern of weeklong course of nocturnal fevers.  T-max 101.30F.  States he will get myalgias, chills, sweats that keep him up at night as well.  Has not take anything for this.  States his resting heart rate is "still 100 bpm ".  Is currently being worked up by his primary care for intermittent chest pain, hypertension, tachycardia: Holter monitor pending.  Denies change in cardiac symptoms: No palpitations, lightheadedness or dizziness, nausea, vomiting, cough, dyspnea, lower extremity edema.  Daytime temperature was 98.1 F yesterday and patient has otherwise been feeling well.  Reports compliance with [employer].    ROI as per HPI  Past Medical History:  Diagnosis Date  . DVT (deep venous thrombosis) ([place])    after half marathon   . Dyslipidemia   . GERD (gastroesophageal reflux disease)   . Gilbert's disease   . HH (hiatus hernia)   . HIV positive ([place]) 89  . Hyperlipidemia   . Renal stone     No Known Allergies      Observations/Objective: 54 year old male sitting in no  acute distress.  Patient is able to speak in full sentences without coughing, sneezing, wheezing.  Patient appears well.  Assessment and Plan: Patient be concerning for nocturnal fevers: Discussed that temperatures can be elevated in viral in immunologic/inflammatory responses.  Will trial Tylenol before bedtime, monitor symptoms and follow-up with PCP Monday morning.  Discussed at length the ER return precautions to which patient verbalized understanding thereof.  Follow Up Instructions: Patient to seek in-person evaluation for, worsening symptoms. I discussed the assessment and treatment plan with the patient. The patient was provided an opportunity to ask questions and all were answered. The patient agreed with the plan and demonstrated an understanding of the instructions.   The patient was advised to call back or seek an in-person evaluation if the symptoms worsen or if the condition fails to improve as anticipated.  I provided 15 minutes of non-face-to-face time during this encounter.    [place] Hall-Potvin, PA-C  04/17/2019 12:29 PM        Hall-Potvin, [place], PA-C 04/17/19 1229

## 2019-04-17 NOTE — Discharge Instructions (Addendum)
Commend taking Tylenol as needed before bed for nighttime fevers. Keep follow-up with PCP for further cardiac work-up. Plan to keep symptom temperature log in the interim. Please seek sooner evaluation in person for worsening aches, chills, uncontrolled fevers, daytime fevers, change in your chest pain, development of palpitations, difficulty breathing, lower leg swelling, weakness.

## 2019-04-22 ENCOUNTER — Ambulatory Visit
Admission: RE | Admit: 2019-04-22 | Discharge: 2019-04-22 | Disposition: A | Discharge status: Home / Self Care | Payer: No Typology Code available for payment source | Source: Ambulatory Visit | Attending: Family Medicine | Admitting: Family Medicine

## 2019-04-22 ENCOUNTER — Ambulatory Visit (INDEPENDENT_AMBULATORY_CARE_PROVIDER_SITE_OTHER): Payer: No Typology Code available for payment source | Admitting: Family Medicine

## 2019-04-22 ENCOUNTER — Other Ambulatory Visit: Payer: Self-pay

## 2019-04-22 ENCOUNTER — Encounter (INDEPENDENT_AMBULATORY_CARE_PROVIDER_SITE_OTHER): Payer: No Typology Code available for payment source

## 2019-04-22 ENCOUNTER — Encounter: Payer: Self-pay | Admitting: Family Medicine

## 2019-04-22 VITALS — BP 105/77 | HR 98 | Temp 99.0°F | Wt 185.0 lb

## 2019-04-22 DIAGNOSIS — R0600 Dyspnea, unspecified: Secondary | ICD-10-CM | POA: Diagnosis not present

## 2019-04-22 DIAGNOSIS — R0609 Other forms of dyspnea: Secondary | ICD-10-CM

## 2019-04-22 DIAGNOSIS — R61 Generalized hyperhidrosis: Secondary | ICD-10-CM

## 2019-04-22 DIAGNOSIS — I700 Atherosclerosis of aorta: Secondary | ICD-10-CM | POA: Diagnosis not present

## 2019-04-22 DIAGNOSIS — R000 Tachycardia, unspecified: Secondary | ICD-10-CM | POA: Diagnosis not present

## 2019-04-22 NOTE — Progress Notes (Signed)
   Subjective:    Patient ID: Darryl Black, male    DOB: Sep 15, 1965, 54 y.o.   MRN: [id number]  HPI He was seen here on February 10 and is in the process of being evaluated for tachycardia.  On the 18th he was seen in an emergent visit because of difficulty with night sweats, dyspnea on exertion, increased heart rate as well as fatigue.  He also states that he has lost 10 pounds in the last several weeks.  No sore throat, earache, chest pain, nausea, vomiting, abdominal pain.Marland Kitchen  He has had his Covid shots.  His HIV status seems to be stable.  Recent EKG was normal.  He does work in the emergency room on a regular basis.   Review of Systems     Objective:   Physical Exam Alert and in no distress. Tympanic membranes and canals are normal. Pharyngeal area is normal. Neck is supple without adenopathy or thyromegaly. Cardiac exam shows a regular sinus rhythm without murmurs or gallops. Lungs are clear to auscultation.        Assessment & Plan:  Night sweats - Plan: CBC with Differential/Platelet, Comprehensive metabolic panel, Sedimentation rate, QuantiFERON-TB Gold Plus, DG Chest 2 View  Dyspnea on exertion - Plan: CBC with Differential/Platelet, Comprehensive metabolic panel, Sedimentation rate, Brain natriuretic peptide  Tachycardia - Plan: CBC with Differential/Platelet, Comprehensive metabolic panel, Brain natriuretic peptide No clear-cut reason for all of this.

## 2019-04-23 DIAGNOSIS — I700 Atherosclerosis of aorta: Secondary | ICD-10-CM | POA: Insufficient documentation

## 2019-04-24 ENCOUNTER — Encounter: Payer: Self-pay | Admitting: Family Medicine

## 2019-04-25 LAB — QUANTIFERON-TB GOLD PLUS
QuantiFERON Mitogen Value: >10 IU/mL
QuantiFERON Nil Value: 0.56 IU/mL
QuantiFERON TB1 Ag Value: 0.5 IU/mL
QuantiFERON TB2 Ag Value: 0.5 IU/mL
QuantiFERON-TB Gold Plus: NEGATIVE

## 2019-04-25 LAB — CBC WITH DIFFERENTIAL/PLATELET
Basophils Absolute: 0 10*3/uL (ref 0.0–0.2)
Basos: 0 %
EOS (ABSOLUTE): 0.2 10*3/uL (ref 0.0–0.4)
Eos: 2 %
Hematocrit: 40.8 % (ref 37.5–51.0)
Hemoglobin: 14.8 g/dL (ref 13.0–17.7)
Immature Grans (Abs): 0.1 10*3/uL (ref 0.0–0.1)
Immature Granulocytes: 1 %
Lymphocytes Absolute: 3.7 10*3/uL — ABNORMAL HIGH (ref 0.7–3.1)
Lymphs: 38 %
MCH: 33.6 pg — ABNORMAL HIGH (ref 26.6–33.0)
MCHC: 36.3 g/dL — ABNORMAL HIGH (ref 31.5–35.7)
MCV: 93 fL (ref 79–97)
Monocytes Absolute: 1.4 10*3/uL — ABNORMAL HIGH (ref 0.1–0.9)
Monocytes: 14 %
Neutrophils Absolute: 4.6 10*3/uL (ref 1.4–7.0)
Neutrophils: 45 %
Platelets: 320 10*3/uL (ref 150–450)
RBC: 4.41 x10E6/uL (ref 4.14–5.80)
RDW: 11.2 % — ABNORMAL LOW (ref 11.6–15.4)
WBC: 9.9 10*3/uL (ref 3.4–10.8)

## 2019-04-25 LAB — COMPREHENSIVE METABOLIC PANEL
ALT: 37 IU/L (ref 0–44)
AST: 17 IU/L (ref 0–40)
Albumin/Globulin Ratio: 1.1 — ABNORMAL LOW (ref 1.2–2.2)
Albumin: 3.9 g/dL (ref 3.8–4.9)
Alkaline Phosphatase: 71 IU/L (ref 39–117)
BUN/Creatinine Ratio: 13 (ref 9–20)
BUN: 18 mg/dL (ref 6–24)
Bilirubin Total: 0.4 mg/dL (ref 0.0–1.2)
CO2: 22 mmol/L (ref 20–29)
Calcium: 10.8 mg/dL — ABNORMAL HIGH (ref 8.7–10.2)
Chloride: 103 mmol/L (ref 96–106)
Creatinine, Ser: 1.4 mg/dL — ABNORMAL HIGH (ref 0.76–1.27)
GFR calc Af Amer: 65 mL/min/{1.73_m2} (ref >59)
GFR calc non Af Amer: 56 mL/min/{1.73_m2} — ABNORMAL LOW (ref >59)
Globulin, Total: 3.5 g/dL (ref 1.5–4.5)
Glucose: 120 mg/dL — ABNORMAL HIGH (ref 65–99)
Potassium: 4.6 mmol/L (ref 3.5–5.2)
Sodium: 141 mmol/L (ref 134–144)
Total Protein: 7.4 g/dL (ref 6.0–8.5)

## 2019-04-25 LAB — BRAIN NATRIURETIC PEPTIDE: BNP: 8.5 pg/mL (ref 0.0–100.0)

## 2019-04-25 LAB — SEDIMENTATION RATE: Sed Rate: 43 mm/hr — ABNORMAL HIGH (ref 0–30)

## 2019-05-14 ENCOUNTER — Telehealth: Payer: Self-pay

## 2019-05-14 NOTE — Telephone Encounter (Signed)
Received report from [employer]. Report shows patient having SVT for 1 minute HR 164. Called patient. Patient stated that he was doing strenuous exercises at the time. Patient did not experience any chest pain or SOB at the time, and is asymptomatic. Will have DOD review. Dr. Angelena Form reviewed, stated it looks like SVT, no new orders. Will send a message to patient's PCP, Dr. Amadeo Garnet, so he  Is aware.

## 2019-05-15 ENCOUNTER — Telehealth: Payer: Self-pay

## 2019-05-15 NOTE — Telephone Encounter (Signed)
Fax received from [employer] stating patient had an episode of SVT (60 sec) 159 bpm on 05/14/19 at 3:15 PM CST. Called the patient and he was completely asymptomatic at the time and states that he was working out at [employer]. Monitor ordered by his PCP Dr. Redmond School for tachycardia. Patient is not on any rate control meds. Reviewed with DOD Dr. Harrington Challenger. No new orders.

## 2019-06-03 ENCOUNTER — Other Ambulatory Visit: Payer: Self-pay | Admitting: Family Medicine

## 2019-06-03 DIAGNOSIS — R000 Tachycardia, unspecified: Secondary | ICD-10-CM

## 2019-06-04 ENCOUNTER — Other Ambulatory Visit: Payer: Self-pay

## 2019-06-04 DIAGNOSIS — I471 Supraventricular tachycardia: Secondary | ICD-10-CM

## 2019-06-04 NOTE — Progress Notes (Signed)
ambu

## 2019-06-06 ENCOUNTER — Encounter: Payer: Self-pay | Admitting: Cardiology

## 2019-06-06 ENCOUNTER — Ambulatory Visit (INDEPENDENT_AMBULATORY_CARE_PROVIDER_SITE_OTHER): Payer: No Typology Code available for payment source | Admitting: Cardiology

## 2019-06-06 ENCOUNTER — Other Ambulatory Visit: Payer: Self-pay

## 2019-06-06 VITALS — BP 112/80 | HR 95 | Ht 69.0 in | Wt 187.0 lb

## 2019-06-06 DIAGNOSIS — R079 Chest pain, unspecified: Secondary | ICD-10-CM | POA: Diagnosis not present

## 2019-06-06 DIAGNOSIS — Z01810 Encounter for preprocedural cardiovascular examination: Secondary | ICD-10-CM

## 2019-06-06 DIAGNOSIS — Z01812 Encounter for preprocedural laboratory examination: Secondary | ICD-10-CM

## 2019-06-06 MED ORDER — METOPROLOL TARTRATE 100 MG PO TABS: 100.0000 mg | ORAL_TABLET | Freq: Once | ORAL | 0 refills | Status: DC

## 2019-06-06 NOTE — Progress Notes (Signed)
Cardiology Office Note:    Date:  06/06/2019   ID:  [name], DOB 1965-09-05, MRN [id number]  PCP:  Denita Lung, MD  Cardiologist:  Candee Furbish, MD  Electrophysiologist:  None   Referring MD: Denita Lung, MD     History of Present Illness:    SHEFFIELD MIGLIORE is a 54 y.o. male here for the evaluation of SVT at the request of Dr. Redmond School.  Event monitor was placed and there was an episode of SVT for 60 seconds at 159 bpm on 05/14/2019 at 3:15 PM.  He was asymptomatic at the time.  Working out at [employer].  Episode of SVT noted on event monitor. Back in 03/2019, feeling CP and resting heart rate shot up, felt fever every night. Fever went away after 2.5 weeks. No further CP. When going to gym worn out.  Both instances at the gym/  No eat;u mi  CKD 3. Saw nephrology - looked ok.   No smoker.   Espo stricture or hernia.    Past Medical History:  Diagnosis Date  . DVT (deep venous thrombosis) ([place])    after half marathon   . Dyslipidemia   . GERD (gastroesophageal reflux disease)   . Gilbert's disease   . HH (hiatus hernia)   . HIV positive ([place]) 89  . Hyperlipidemia   . Renal stone     Past Surgical History:  Procedure Laterality Date  . APPENDECTOMY    . COLONOSCOPY     last 10 years + ago 2002 in epic- colon all normal  . SKIN BIOPSY Right 03/14/2018   seborrheic keratosis inflamed  . UPPER GASTROINTESTINAL ENDOSCOPY      Current Medications: Current Meds  Medication Sig  . bictegravir-emtricitabine-tenofovir AF (BIKTARVY) 50-200-25 MG TABS tablet Take 1 tablet by mouth daily.  . cholecalciferol (VITAMIN D) 1000 UNITS tablet Take 1,000 Units by mouth daily.  . fenofibrate 160 MG tablet Take 1 tablet (160 mg total) by mouth daily.  Marland Kitchen icosapent Ethyl (VASCEPA) 1 g capsule Take 2 capsules (2 g total) by mouth 2 (two) times daily.  Marland Kitchen losartan (COZAAR) 50 MG tablet Take 1 tablet (50 mg total) by mouth daily.  . Multiple Vitamin (MULTIVITAMIN WITH MINERALS)  TABS Take 1 tablet by mouth daily.  . pantoprazole (PROTONIX) 40 MG tablet Take 1 tablet (40 mg total) by mouth daily.  Marland Kitchen zolpidem (AMBIEN) 10 MG tablet Take 0.5-1 tablets (5-10 mg total) by mouth at bedtime as needed. for sleep     Allergies:   Patient has no known allergies.   Social History   Socioeconomic History  . Marital status: Married    Spouse name: Not on file  . Number of children: Not on file  . Years of education: Not on file  . Highest education level: Not on file  Occupational History  . Occupation: Social research officer, government: [employer]  Tobacco Use  . Smoking status: Never Smoker  . Smokeless tobacco: Never Used  Substance and Sexual Activity  . Alcohol use: No  . Drug use: No  . Sexual activity: Yes  Other Topics Concern  . Not on file  Social History Narrative  . Not on file   Social Determinants of Health   Financial Resource Strain:   . Difficulty of Paying Living Expenses:   Food Insecurity:   . Worried About Charity fundraiser in the Last Year:   . [employer] of  Food in the Last Year:   Transportation Needs:   . Film/video editor (Medical):   Marland Kitchen Lack of Transportation (Non-Medical):   Physical Activity:   . Days of Exercise per Week:   . Minutes of Exercise per Session:   Stress:   . Feeling of Stress :   Social Connections:   . Frequency of Communication with Friends and Family:   . Frequency of Social Gatherings with Friends and Family:   . Attends Religious Services:   . Active Member of Clubs or Organizations:   . Attends Archivist Meetings:   Marland Kitchen Marital Status:      Family History: The patient's family history includes Diabetes in his father; Heart disease in his maternal grandfather and paternal grandfather; Pancreatic cancer in his mother. There is no history of Colon cancer, Colon polyps, Esophageal cancer, Rectal cancer, or Stomach cancer.  ROS:   Please see the history of present illness.       All other systems reviewed and are negative.  EKGs/Labs/Other Studies Reviewed:    The following studies were reviewed today: Lab work, prior office notes, scans evaluated  EKG:  EKG is not ordered today.  The ekg ordered today demonstrates prior shows sinus rhythm heart rate in the 70s. No abnormalities.  Recent Labs: 04/22/2019: ALT 37; BNP 8.5; BUN 18; Creatinine, Ser 1.40; Hemoglobin 14.8; Platelets 320; Potassium 4.6; Sodium 141  Recent Lipid Panel    Component Value Date/Time   CHOL 204 (H) 01/28/2019 1124   TRIG 215 (H) 01/28/2019 1124   HDL 35 (L) 01/28/2019 1124   CHOLHDL 5.8 (H) 01/28/2019 1124   CHOLHDL 5.8 (H) 03/01/2017 1150   VLDL 44 (H) 06/27/2016 0933   LDLCALC 130 (H) 01/28/2019 1124   LDLCALC 120 (H) 03/01/2017 1150    Physical Exam:    VS:  BP 112/80   Pulse 95   Ht 5\' 9"  (1.753 m)   Wt 187 lb (84.8 kg)   SpO2 95%   BMI 27.62 kg/m     Wt Readings from Last 3 Encounters:  06/06/19 187 lb (84.8 kg)  04/22/19 185 lb (83.9 kg)  04/09/19 185 lb 3.2 oz (84 kg)     GEN:  Well nourished, well developed in no acute distress HEENT: Normal NECK: No JVD; No carotid bruits LYMPHATICS: No lymphadenopathy CARDIAC: RRR, no murmurs, rubs, gallops RESPIRATORY:  Clear to auscultation without rales, wheezing or rhonchi  ABDOMEN: Soft, non-tender, non-distended MUSCULOSKELETAL:  No edema; No deformity  SKIN: Warm and dry, no rashes, no embolic phenomenon NEUROLOGIC:  Alert and oriented x 3 PSYCHIATRIC:  Normal affect   ASSESSMENT:    1. Pre-procedure lab exam   2. Chest pain, unspecified type    PLAN:    In order of problems listed above:  Chest discomfort, SVT, prior fevers -I will check an echocardiogram to ensure proper structure and function of his heart. I also to make sure that there is no evidence of any valvular vegetations. Thankfully, his fevers have subsided. His white count was not very elevated. His sed rate was moderately elevated at 43 back  in February. He had received his last Covid vaccine in January. He does not have any physical exam evidence of any embolic phenomenon. -I will also check a coronary CT scan given his chest discomfort previously. Want to make sure that he is not have any underlying coronary artery disease or pericardial effusion for instance.  HIV -Well-controlled  Chronic kidney disease stage III  3 -Creatinine 1.4 range has been stable for quite some time. Oral hydration prior to CT scan. He is currently taking losartan 50 mg. We will hold this the day prior to his CT scan.  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 You can clearly see P waves and some of the tracings preceding the QRS. There is one tracing that it is more challenging to see the P waves but they could be buried in the T. I think for now, no new medications. His heart rate increased during night was secondary to fever previously.  Extensive review of patient monitoring, prior medical records, lab work performed.  Medication Adjustments/Labs and Tests Ordered: Current medicines are reviewed at length with the patient today.  Concerns regarding medicines are outlined above.  Orders Placed This Encounter  Procedures  . CT CORONARY MORPH W/CTA COR W/SCORE W/CA W/CM &/OR WO/CM  . CT CORONARY FRACTIONAL FLOW RESERVE DATA PREP  . CT CORONARY FRACTIONAL FLOW RESERVE FLUID ANALYSIS  . Basic metabolic panel  . ECHOCARDIOGRAM COMPLETE   Meds ordered this encounter  Medications  . metoprolol tartrate (LOPRESSOR) 100 MG tablet    Sig: Take 1 tablet (100 mg total) by mouth once for 1 dose. Take one tablet 2 hours before your CT scan    Dispense:  1 tablet    Refill:  0    Patient Instructions  Medication Instructions:  The current medical regimen is effective;  continue present plan and medications.  *If you need a refill on your  cardiac medications before your next appointment, please call your pharmacy*  Lab Work: Please have blood work before your CT scan (BMP)  If you have labs (blood work) drawn today and your tests are completely normal, you will receive your results only by: Marland Kitchen MyChart Message (if you have MyChart) OR . A paper copy in the mail If you have any lab test that is abnormal or we need to change your treatment, we will call you to review the results.   Testing/Procedures: Your physician has requested that you have an echocardiogram. Echocardiography is a painless test that uses sound waves to create images of your heart. It provides your doctor with information about the size and shape of your heart and how well your heart's chambers and valves are working. This procedure takes approximately one hour. There are no restrictions for this procedure.  Your physician has requested that you have Coronary CT. Cardiac computed tomography (CT) is a painless test that uses an x-ray machine to take clear, detailed pictures of your heart.   Follow-Up: At [facility], you and your health needs are our priority.  As part of our continuing mission to provide you with exceptional heart care, we have created designated Provider Care Teams.  These Care Teams include your primary Cardiologist (physician) and Advanced Practice Providers (APPs -  Physician Assistants and Nurse Practitioners) who all work together to provide you with the care you need, when you need it.  We recommend signing up for the patient portal called "MyChart".  Sign up information is provided on this After Visit Summary.  MyChart is used to connect with patients for Virtual Visits (Telemedicine).  Patients are able to view lab/test results, encounter notes, upcoming appointments, etc.  Non-urgent messages can be sent to your provider as well.   To learn more about what you can do  with MyChart, go to [email or web address].    Your next  appointment:   2 month(s)  The format for your next appointment:   In Person  Provider:   Candee Furbish, MD   Thank you for choosing [facility]!!    Your cardiac CT will be scheduled at:   [facility] [address] [place], [place] [address] [phone]  Please arrive at the [facility] main entrance of [facility] 30 minutes prior to test start time. Proceed to the [facility] Radiology Department (first floor) to check-in and test prep.  Please follow these instructions carefully (unless otherwise directed):  Hold all erectile dysfunction medications at least 3 days (72 hrs) prior to test.  On the Night Before the Test: . Be sure to Drink plenty of water. . Do not consume any caffeinated/decaffeinated beverages or chocolate 12 hours prior to your test. . Do not take any antihistamines 12 hours prior to your test. . If you take Metformin do not take 24 hours prior to test.  On the Day of the Test: . Drink plenty of water. Do not drink any water within one hour of the test. . Do not eat any food 4 hours prior to the test. . You may take your regular medications prior to the test.  . Take metoprolol (Lopressor) two hours prior to test. . HOLD Furosemide/Hydrochlorothiazide morning of the test. . FEMALES- please wear underwire-free bra if available  After the Test: . Drink plenty of water. . After receiving IV contrast, you may experience a mild flushed feeling. This is normal. . On occasion, you may experience a mild rash up to 24 hours after the test. This is not dangerous. If this occurs, you can take Benadryl 25 mg and increase your fluid intake. . If you experience trouble breathing, this can be serious. If it is severe call 911 IMMEDIATELY. If it is mild, please call our office. . If you take any of these medications: Glipizide/Metformin, Avandament, Glucavance, please do not take 48 hours after completing test unless otherwise  instructed.   Once we have confirmed authorization from your insurance company, we will call you to set up a date and time for your test.   For non-scheduling related questions, please contact the cardiac imaging nurse navigator should you have any questions/concerns: Marchia Bond, RN Navigator Cardiac Imaging [facility] [phone] office  For scheduling needs, including cancellations and rescheduling, please call [phone].       Signed, Candee Furbish, MD  06/06/2019 5:41 PM    [place]

## 2019-06-06 NOTE — Patient Instructions (Addendum)
Medication Instructions:  The current medical regimen is effective;  continue present plan and medications.  *If you need a refill on your cardiac medications before your next appointment, please call your pharmacy*  Lab Work: Please have blood work before your CT scan (BMP)  If you have labs (blood work) drawn today and your tests are completely normal, you will receive your results only by: Marland Kitchen MyChart Message (if you have MyChart) OR . A paper copy in the mail If you have any lab test that is abnormal or we need to change your treatment, we will call you to review the results.   Testing/Procedures: Your physician has requested that you have an echocardiogram. Echocardiography is a painless test that uses sound waves to create images of your heart. It provides your doctor with information about the size and shape of your heart and how well your heart's chambers and valves are working. This procedure takes approximately one hour. There are no restrictions for this procedure.  Your physician has requested that you have Coronary CT. Cardiac computed tomography (CT) is a painless test that uses an x-ray machine to take clear, detailed pictures of your heart.   Follow-Up: At [facility], you and your health needs are our priority.  As part of our continuing mission to provide you with exceptional heart care, we have created designated Provider Care Teams.  These Care Teams include your primary Cardiologist (physician) and Advanced Practice Providers (APPs -  Physician Assistants and Nurse Practitioners) who all work together to provide you with the care you need, when you need it.  We recommend signing up for the patient portal called "MyChart".  Sign up information is provided on this After Visit Summary.  MyChart is used to connect with patients for Virtual Visits (Telemedicine).  Patients are able to view lab/test results, encounter notes, upcoming appointments, etc.  Non-urgent messages can  be sent to your provider as well.   To learn more about what you can do with MyChart, go to [email or web address].    Your next appointment:   2 month(s)  The format for your next appointment:   In Person  Provider:   Candee Furbish, MD   Thank you for choosing [facility]!!    Your cardiac CT will be scheduled at:   [facility] [address] [place], [place] [address] [phone]  Please arrive at the [facility] main entrance of [facility] 30 minutes prior to test start time. Proceed to the [facility] Radiology Department (first floor) to check-in and test prep.  Please follow these instructions carefully (unless otherwise directed):  Hold all erectile dysfunction medications at least 3 days (72 hrs) prior to test.  On the Night Before the Test: . Be sure to Drink plenty of water. . Do not consume any caffeinated/decaffeinated beverages or chocolate 12 hours prior to your test. . Do not take any antihistamines 12 hours prior to your test. . If you take Metformin do not take 24 hours prior to test.  On the Day of the Test: . Drink plenty of water. Do not drink any water within one hour of the test. . Do not eat any food 4 hours prior to the test. . You may take your regular medications prior to the test.  . Take metoprolol (Lopressor) two hours prior to test. . HOLD Furosemide/Hydrochlorothiazide morning of the test. . FEMALES- please wear underwire-free bra if available  After the Test: . Drink plenty of  water. . After receiving IV contrast, you may experience a mild flushed feeling. This is normal. . On occasion, you may experience a mild rash up to 24 hours after the test. This is not dangerous. If this occurs, you can take Benadryl 25 mg and increase your fluid intake. . If you experience trouble breathing, this can be serious. If it is severe call 911 IMMEDIATELY. If it is mild, please call our office. . If you take any of  these medications: Glipizide/Metformin, Avandament, Glucavance, please do not take 48 hours after completing test unless otherwise instructed.   Once we have confirmed authorization from your insurance company, we will call you to set up a date and time for your test.   For non-scheduling related questions, please contact the cardiac imaging nurse navigator should you have any questions/concerns: Marchia Bond, RN Navigator Cardiac Imaging [facility] [phone] office  For scheduling needs, including cancellations and rescheduling, please call [phone].

## 2019-06-19 ENCOUNTER — Ambulatory Visit: Payer: No Typology Code available for payment source | Admitting: Internal Medicine

## 2019-06-19 ENCOUNTER — Ambulatory Visit (HOSPITAL_COMMUNITY): Payer: No Typology Code available for payment source | Attending: Cardiology

## 2019-06-19 ENCOUNTER — Other Ambulatory Visit: Payer: Self-pay

## 2019-06-19 DIAGNOSIS — R079 Chest pain, unspecified: Secondary | ICD-10-CM | POA: Diagnosis not present

## 2019-06-24 ENCOUNTER — Other Ambulatory Visit: Payer: Self-pay

## 2019-06-24 ENCOUNTER — Other Ambulatory Visit: Payer: No Typology Code available for payment source

## 2019-06-24 DIAGNOSIS — R079 Chest pain, unspecified: Secondary | ICD-10-CM

## 2019-06-24 DIAGNOSIS — Z01812 Encounter for preprocedural laboratory examination: Secondary | ICD-10-CM

## 2019-06-24 LAB — BASIC METABOLIC PANEL
BUN/Creatinine Ratio: 10 (ref 9–20)
BUN: 16 mg/dL (ref 6–24)
CO2: 24 mmol/L (ref 20–29)
Calcium: 10.4 mg/dL — ABNORMAL HIGH (ref 8.7–10.2)
Chloride: 102 mmol/L (ref 96–106)
Creatinine, Ser: 1.64 mg/dL — ABNORMAL HIGH (ref 0.76–1.27)
GFR calc Af Amer: 54 mL/min/{1.73_m2} — ABNORMAL LOW (ref >59)
GFR calc non Af Amer: 46 mL/min/{1.73_m2} — ABNORMAL LOW (ref >59)
Glucose: 116 mg/dL — ABNORMAL HIGH (ref 65–99)
Potassium: 4.4 mmol/L (ref 3.5–5.2)
Sodium: 141 mmol/L (ref 134–144)

## 2019-07-02 ENCOUNTER — Telehealth (HOSPITAL_COMMUNITY): Payer: Self-pay | Admitting: Emergency Medicine

## 2019-07-02 NOTE — Telephone Encounter (Signed)
Reaching out to patient to offer assistance regarding upcoming cardiac imaging study; pt verbalizes understanding of appt date/time, parking situation and where to check in, pre-test NPO status and medications ordered, and verified current allergies; name and call back number provided for further questions should they arise Marchia Bond RN Navigator Cardiac Imaging [facility] [phone] office [phone] cell   Pt understands that he needs istat prior to contrast since abn labs > 1 wk old. Encouraged PO hydration prior to appt tomorrow. Pt will hold daily losartan and take 100mg  metoprolol tartrate prior to scan Clarise Cruz

## 2019-07-03 ENCOUNTER — Other Ambulatory Visit: Payer: Self-pay

## 2019-07-03 ENCOUNTER — Encounter (HOSPITAL_COMMUNITY): Payer: Self-pay

## 2019-07-03 ENCOUNTER — Ambulatory Visit (HOSPITAL_COMMUNITY)
Admission: RE | Admit: 2019-07-03 | Discharge: 2019-07-03 | Disposition: A | Discharge status: Home / Self Care | Payer: No Typology Code available for payment source | Source: Ambulatory Visit | Attending: Cardiology | Admitting: Cardiology

## 2019-07-03 DIAGNOSIS — R079 Chest pain, unspecified: Secondary | ICD-10-CM | POA: Insufficient documentation

## 2019-07-03 LAB — POCT I-STAT CREATININE: Creatinine, Ser: 1.9 mg/dL — ABNORMAL HIGH (ref 0.61–1.24)

## 2019-07-03 MED ORDER — NITROGLYCERIN 0.4 MG SL SUBL: 0.8000 mg | SUBLINGUAL_TABLET | Freq: Once | SUBLINGUAL | Status: DC

## 2019-07-03 NOTE — Progress Notes (Signed)
Phone call to [facility]. Pt istat creatinine is 1.9 today. She asks that we advise pt that his exam will have to be rescheduled d/t elevated creatinine. Alford Highland will contact pt PCP and Cardiologist to make a plan for the next steps and she will contact pt about rescheduling this exam. Pt verbalizes understanding and appreciative of this information.

## 2019-07-08 NOTE — Progress Notes (Signed)
Review of his blood work indicates he does have a high creatinine.  He has seen renal in the distant past and was essentially told to stay away from NSAIDs.  His kidney function over the last several years has been deteriorating slightly.  He is to come in in the near future and I strongly encouraged him to keep himself well-hydrated before he does.

## 2019-07-29 ENCOUNTER — Ambulatory Visit (INDEPENDENT_AMBULATORY_CARE_PROVIDER_SITE_OTHER): Payer: No Typology Code available for payment source | Admitting: Family Medicine

## 2019-07-29 ENCOUNTER — Encounter: Payer: Self-pay | Admitting: Family Medicine

## 2019-07-29 ENCOUNTER — Other Ambulatory Visit: Payer: Self-pay

## 2019-07-29 VITALS — BP 134/96 | HR 72 | Temp 97.8°F | Ht 69.5 in | Wt 190.0 lb

## 2019-07-29 DIAGNOSIS — Z8719 Personal history of other diseases of the digestive system: Secondary | ICD-10-CM | POA: Diagnosis not present

## 2019-07-29 DIAGNOSIS — E118 Type 2 diabetes mellitus with unspecified complications: Secondary | ICD-10-CM

## 2019-07-29 DIAGNOSIS — B20 Human immunodeficiency virus [HIV] disease: Secondary | ICD-10-CM

## 2019-07-29 DIAGNOSIS — N1831 Chronic kidney disease, stage 3a: Secondary | ICD-10-CM

## 2019-07-29 DIAGNOSIS — E785 Hyperlipidemia, unspecified: Secondary | ICD-10-CM

## 2019-07-29 DIAGNOSIS — Z87442 Personal history of urinary calculi: Secondary | ICD-10-CM

## 2019-07-29 DIAGNOSIS — I700 Atherosclerosis of aorta: Secondary | ICD-10-CM

## 2019-07-29 DIAGNOSIS — E1169 Type 2 diabetes mellitus with other specified complication: Secondary | ICD-10-CM

## 2019-07-29 DIAGNOSIS — Z79899 Other long term (current) drug therapy: Secondary | ICD-10-CM

## 2019-07-29 DIAGNOSIS — Z0000 Encounter for general adult medical examination without abnormal findings: Secondary | ICD-10-CM

## 2019-07-29 LAB — POCT GLYCOSYLATED HEMOGLOBIN (HGB A1C): Hemoglobin A1C: 6.6 % — AB (ref 4.0–5.6)

## 2019-07-29 NOTE — Progress Notes (Signed)
   Subjective:    Patient ID: Darryl Black, male    DOB: 02/20/1966, 54 y.o.   MRN: [id number]  HPI He is here for complete examination.  He recently was supposed to have a cardiac CT scan done but his kidney function because it to be canceled.  Review of the record indicates he has had low creatinine levels for quite some time and he states that in the past he was evaluated by renal and found to not have any major underlying renal issues.  He is in CKG stage III.  He continues on Protonix for treatment of his esophageal issues and finds this to be quite useful.  He continues on [place] and is having no difficulty with that medication his HIV status has been quite stable.  He does have a previous history of Gilbert's disease.  Also recent x-rays did show evidence of atherosclerosis.  Otherwise he seems to be doing quite nicely.  His home life and work are going well.  Family and social history as well as health maintenance and immunizations was reviewed.  Review of Systems  All other systems reviewed and are negative.      Objective:   Physical Exam  Alert and in no distress. Tympanic membranes and canals are normal. Pharyngeal area is normal. Neck is supple without adenopathy or thyromegaly. Cardiac exam shows a regular sinus rhythm without murmurs or gallops. Lungs are clear to auscultation. Abdominal exam shows normal bowel sounds without masses or tenderness.  Hemoglobin A1c is now 6.6.     Assessment & Plan:  Routine general medical examination at a health care facility - Plan: Lipid panel, Comprehensive metabolic panel, CBC with Differential/Platelet  History of esophageal stricture  History of renal stone  HIV disease ([place]) - Plan: T-helper cells (CD4) count (not at [facility]), HIV 1 RNA quant-no reflex-bld, Comprehensive metabolic panel, CBC with Differential/Platelet  Stage 3a chronic kidney disease - Plan: Comprehensive metabolic panel  Hyperlipidemia associated with type 2  diabetes mellitus ([place]) - Plan: Lipid panel  Controlled type 2 diabetes mellitus with complication, without long-term current use of insulin ([place]) - Plan: POCT glycosylated hemoglobin (Hb A1C), Lipid panel, Comprehensive metabolic panel, CBC with Differential/Platelet  Aortic atherosclerosis (HCC) - Plan: Lipid panel  Gilbert's disease  Encounter for long-term (current) use of medications - Plan: POCT glycosylated hemoglobin (Hb A1C), Lipid panel, T-helper cells (CD4) count (not at [facility]), HIV 1 RNA quant-no reflex-bld, Comprehensive metabolic panel, CBC with Differential/Platelet  I explained that now technically he is a diabetic.  His weight is quite good.  He does exercise mainly with doing weights.  I did recommend 20 minutes of something physical daily or 150 minutes a week.  Also discussed cutting back on carbohydrates.  Gave him 2 different websites to go to to get information.  Encouraged him to keep himself well-hydrated.  Discussed the use of diabetes medications but at this point no need for that.  He was comfortable with that.

## 2019-07-29 NOTE — Patient Instructions (Signed)
20 minutes of something physical daily or 150 minutes week of something Go to the American diabetes Association website to get more information on it or familydoctor.org

## 2019-07-30 LAB — COMPREHENSIVE METABOLIC PANEL
ALT: 54 IU/L — ABNORMAL HIGH (ref 0–44)
AST: 22 IU/L (ref 0–40)
Albumin/Globulin Ratio: 1.8 (ref 1.2–2.2)
Albumin: 4.7 g/dL (ref 3.8–4.9)
Alkaline Phosphatase: 55 IU/L (ref 48–121)
BUN/Creatinine Ratio: 8 — ABNORMAL LOW (ref 9–20)
BUN: 13 mg/dL (ref 6–24)
Bilirubin Total: 1.2 mg/dL (ref 0.0–1.2)
CO2: 25 mmol/L (ref 20–29)
Calcium: 10.2 mg/dL (ref 8.7–10.2)
Chloride: 99 mmol/L (ref 96–106)
Creatinine, Ser: 1.72 mg/dL — ABNORMAL HIGH (ref 0.76–1.27)
GFR calc Af Amer: 51 mL/min/{1.73_m2} — ABNORMAL LOW (ref >59)
GFR calc non Af Amer: 44 mL/min/{1.73_m2} — ABNORMAL LOW (ref >59)
Globulin, Total: 2.6 g/dL (ref 1.5–4.5)
Glucose: 99 mg/dL (ref 65–99)
Potassium: 4.2 mmol/L (ref 3.5–5.2)
Sodium: 138 mmol/L (ref 134–144)
Total Protein: 7.3 g/dL (ref 6.0–8.5)

## 2019-07-30 LAB — T-HELPER CELLS (CD4) COUNT (NOT AT ARMC)
% CD 4 Pos. Lymph.: 35.9 % (ref 30.8–58.5)
Absolute CD 4 Helper: 1436 /uL (ref 359–1519)
Basophils Absolute: 0.1 10*3/uL (ref 0.0–0.2)
Basos: 1 %
EOS (ABSOLUTE): 0.2 10*3/uL (ref 0.0–0.4)
Eos: 2 %
Hematocrit: 49.7 % (ref 37.5–51.0)
Hemoglobin: 16.8 g/dL (ref 13.0–17.7)
Immature Grans (Abs): 0 10*3/uL (ref 0.0–0.1)
Immature Granulocytes: 0 %
Lymphocytes Absolute: 4 10*3/uL — ABNORMAL HIGH (ref 0.7–3.1)
Lymphs: 51 %
MCH: 32.1 pg (ref 26.6–33.0)
MCHC: 33.8 g/dL (ref 31.5–35.7)
MCV: 95 fL (ref 79–97)
Monocytes Absolute: 0.9 10*3/uL (ref 0.1–0.9)
Monocytes: 12 %
Neutrophils Absolute: 2.6 10*3/uL (ref 1.4–7.0)
Neutrophils: 34 %
Platelets: 223 10*3/uL (ref 150–450)
RBC: 5.23 x10E6/uL (ref 4.14–5.80)
RDW: 14.4 % (ref 11.6–15.4)
WBC: 7.8 10*3/uL (ref 3.4–10.8)

## 2019-07-30 LAB — LIPID PANEL
Chol/HDL Ratio: 5.1 ratio — ABNORMAL HIGH (ref 0.0–5.0)
Cholesterol, Total: 187 mg/dL (ref 100–199)
HDL: 37 mg/dL — ABNORMAL LOW (ref >39)
LDL Chol Calc (NIH): 122 mg/dL — ABNORMAL HIGH (ref 0–99)
Triglycerides: 156 mg/dL — ABNORMAL HIGH (ref 0–149)
VLDL Cholesterol Cal: 28 mg/dL (ref 5–40)

## 2019-07-30 LAB — HIV-1 RNA QUANT-NO REFLEX-BLD: HIV-1 RNA Viral Load: <20 copies/mL

## 2019-08-07 ENCOUNTER — Other Ambulatory Visit: Payer: Self-pay

## 2019-08-07 ENCOUNTER — Other Ambulatory Visit: Payer: Self-pay | Admitting: Cardiology

## 2019-08-07 ENCOUNTER — Encounter: Payer: Self-pay | Admitting: Cardiology

## 2019-08-07 ENCOUNTER — Ambulatory Visit (INDEPENDENT_AMBULATORY_CARE_PROVIDER_SITE_OTHER): Payer: No Typology Code available for payment source | Admitting: Cardiology

## 2019-08-07 VITALS — BP 100/60 | HR 80 | Ht 69.5 in | Wt 185.0 lb

## 2019-08-07 DIAGNOSIS — I10 Essential (primary) hypertension: Secondary | ICD-10-CM

## 2019-08-07 DIAGNOSIS — E119 Type 2 diabetes mellitus without complications: Secondary | ICD-10-CM

## 2019-08-07 DIAGNOSIS — R079 Chest pain, unspecified: Secondary | ICD-10-CM

## 2019-08-07 MED ORDER — ROSUVASTATIN CALCIUM 5 MG PO TABS
5.0000 mg | ORAL_TABLET | Freq: Every day | ORAL | 3 refills | Status: DC
Start: 2019-08-07 — End: 2019-08-07

## 2019-08-07 NOTE — Progress Notes (Signed)
Cardiology Office Note:    Date:  08/07/2019   ID:  [name], DOB 1965/04/10, MRN [id number]  PCP:  Denita Lung, MD  [facility] Cardiologist:  Candee Furbish, MD  [facility] Electrophysiologist:  None   Referring MD: Denita Lung, MD     History of Present Illness:    Darryl Black is a 54 y.o. male here for follow-up of chest discomfort, SVT noted on monitor.  Back in February 2021 felt chest discomfort and resting heart rate went up.  Based upon the chest discomfort, we went ahead ordered coronary CT scan. I do not see that this has been done because of kidney function.  He had had low creatinine levels for quite some time.  No major renal issues underlying.  Has Gilbert's disease as well.  CKD stage III creatinine has been ranging from 1.4-1.9 most recently 1.7.  LDL 122 hemoglobin A1c 6.6  Thankfully, not having any further chest discomfort.  Doing well.  His blood pressure has been quite low at times.  Felt dizzy with 98/60 at 1 point.     Past Medical History:  Diagnosis Date  . DVT (deep venous thrombosis) ([place])    after half marathon   . Dyslipidemia   . GERD (gastroesophageal reflux disease)   . Gilbert's disease   . HH (hiatus hernia)   . HIV positive ([place]) 89  . Hyperlipidemia   . Renal stone     Past Surgical History:  Procedure Laterality Date  . APPENDECTOMY    . COLONOSCOPY     last 10 years + ago 2002 in epic- colon all normal  . SKIN BIOPSY Right 03/14/2018   seborrheic keratosis inflamed  . UPPER GASTROINTESTINAL ENDOSCOPY      Current Medications: Current Meds  Medication Sig  . bictegravir-emtricitabine-tenofovir AF (BIKTARVY) 50-200-25 MG TABS tablet Take 1 tablet by mouth daily.  . cholecalciferol (VITAMIN D) 1000 UNITS tablet Take 1,000 Units by mouth daily.  . fenofibrate 160 MG tablet Take 1 tablet (160 mg total) by mouth daily.  Marland Kitchen icosapent Ethyl (VASCEPA) 1 g capsule Take 2 capsules (2 g total) by mouth 2 (two)  times daily.  . Multiple Vitamin (MULTIVITAMIN WITH MINERALS) TABS Take 1 tablet by mouth daily.  . pantoprazole (PROTONIX) 40 MG tablet Take 1 tablet (40 mg total) by mouth daily.  Marland Kitchen zolpidem (AMBIEN) 10 MG tablet Take 0.5-1 tablets (5-10 mg total) by mouth at bedtime as needed. for sleep  . [DISCONTINUED] losartan (COZAAR) 50 MG tablet Take 1 tablet (50 mg total) by mouth daily.     Allergies:   Patient has no known allergies.   Social History   Socioeconomic History  . Marital status: Married    Spouse name: Not on file  . Number of children: Not on file  . Years of education: Not on file  . Highest education level: Not on file  Occupational History  . Occupation: Social research officer, government: [employer]  Tobacco Use  . Smoking status: Never Smoker  . Smokeless tobacco: Never Used  Substance and Sexual Activity  . Alcohol use: No  . Drug use: No  . Sexual activity: Yes  Other Topics Concern  . Not on file  Social History Narrative  . Not on file   Social Determinants of Health   Financial Resource Strain:   . Difficulty of Paying Living Expenses:   Food Insecurity:   . Worried About Running  Out of Food in the Last Year:   . [place] in the Last Year:   Transportation Needs:   . Lack of Transportation (Medical):   Marland Kitchen Lack of Transportation (Non-Medical):   Physical Activity:   . Days of Exercise per Week:   . Minutes of Exercise per Session:   Stress:   . Feeling of Stress :   Social Connections:   . Frequency of Communication with Friends and Family:   . Frequency of Social Gatherings with Friends and Family:   . Attends Religious Services:   . Active Member of Clubs or Organizations:   . Attends Archivist Meetings:   Marland Kitchen Marital Status:      Family History: The patient's family history includes Diabetes in his father; Heart disease in his maternal grandfather and paternal grandfather; Pancreatic cancer in his mother.  There is no history of Colon cancer, Colon polyps, Esophageal cancer, Rectal cancer, or Stomach cancer.  ROS:   Please see the history of present illness.     All other systems reviewed and are negative.  EKGs/Labs/Other Studies Reviewed:    The following studies were reviewed today:  ECHO 06/19/19 1. Normal GLS -21.6. Left ventricular ejection fraction, by estimation,  is 55 to 60%. The left ventricle has normal function. The left ventricle  has no regional wall motion abnormalities. Left ventricular diastolic  parameters were normal.  2. Right ventricular systolic function is normal. The right ventricular  size is normal. There is normal pulmonary artery systolic pressure.  3. The mitral valve is normal in structure. Trivial mitral valve  regurgitation. No evidence of mitral stenosis.  4. The aortic valve is tricuspid. Aortic valve regurgitation is not  visualized. Mild to moderate aortic valve sclerosis/calcification is  present, without any evidence of aortic stenosis.  5. The inferior vena cava is normal in size with greater than 50%  respiratory variability, suggesting right atrial pressure of 3 mmHg.    Recent Labs: 04/22/2019: BNP 8.5 07/29/2019: ALT 54; BUN 13; Creatinine, Ser 1.72; Hemoglobin 16.8; Platelets 223; Potassium 4.2; Sodium 138  Recent Lipid Panel    Component Value Date/Time   CHOL 187 07/29/2019 1118   TRIG 156 (H) 07/29/2019 1118   HDL 37 (L) 07/29/2019 1118   CHOLHDL 5.1 (H) 07/29/2019 1118   CHOLHDL 5.8 (H) 03/01/2017 1150   VLDL 44 (H) 06/27/2016 0933   LDLCALC 122 (H) 07/29/2019 1118   LDLCALC 120 (H) 03/01/2017 1150    Physical Exam:    VS:  BP 100/60   Pulse 80   Ht 5' 9.5" (1.765 m)   Wt 185 lb (83.9 kg)   SpO2 97%   BMI 26.93 kg/m     Wt Readings from Last 3 Encounters:  08/07/19 185 lb (83.9 kg)  07/29/19 190 lb (86.2 kg)  06/06/19 187 lb (84.8 kg)     GEN:  Well nourished, well developed in no acute distress HEENT:  Normal NECK: No JVD; No carotid bruits LYMPHATICS: No lymphadenopathy CARDIAC: RRR, no murmurs, rubs, gallops RESPIRATORY:  Clear to auscultation without rales, wheezing or rhonchi  ABDOMEN: Soft, non-tender, non-distended MUSCULOSKELETAL:  No edema; No deformity  SKIN: Warm and dry NEUROLOGIC:  Alert and oriented x 3 PSYCHIATRIC:  Normal affect   ASSESSMENT:    1. Chest pain, unspecified type   2. Diabetes mellitus with coincident hypertension ([place])    PLAN:    In order of problems listed above:  Chest discomfort, SVT, prior fever -  Echocardiogram was unremarkable. -CT scan of coronary arteries was unable to be performed because of creatinine of 1.9.  On repeat it was 1.7. -If chest pain returns, we could consider stress testing.  HTN --98/66 lightheaded, we will go ahead and stop his losartan 50 mg.  I will ask him to repeat his blood pressures at home with blood pressure cuff.  He will message [place] in about 1 month with readings.  If necessary, can start losartan back at 12.5 mg once a day, perhaps just for renal protection.  HIV -Well-controlled with medications as above.  CKD 3 -Creatinine previously was in the 1.4 range, currently 1.7.  SVT -Previously thought to be sinus tachycardia when working at the gym vigorously, P waves were noted.  Reassurance.  Diabetes mellitus -Hemoglobin A1c 6.6.  With Biktarvy, we can use Crestor 5 mg once a day.  No drug drug interactions.  He is also on Vascepa and fenofibrate for hypertriglyceridemia as well.  Triglycerides at last check were 156.  LDL was 123.  In the future, we will check ALT and lipid panel.  67-month follow-up  Medication Adjustments/Labs and Tests Ordered: Current medicines are reviewed at length with the patient today.  Concerns regarding medicines are outlined above.  No orders of the defined types were placed in this encounter.  Meds ordered this encounter  Medications  . rosuvastatin (CRESTOR) 5 MG tablet     Sig: Take 1 tablet (5 mg total) by mouth daily.    Dispense:  90 tablet    Refill:  3    Patient Instructions  Medication Instructions:  Please discontinue your Losartan.  Keep a log of your blood pressures and let us know in 1 month how they are running. Start Crestor 5 mg a day. Continue all other medications as listed.  *If you need a refill on your cardiac medications before your next appointment, please call your pharmacy*  Follow-Up: At [facility], you and your health needs are our priority.  As part of our continuing mission to provide you with exceptional heart care, we have created designated Provider Care Teams.  These Care Teams include your primary Cardiologist (physician) and Advanced Practice Providers (APPs -  Physician Assistants and Nurse Practitioners) who all work together to provide you with the care you need, when you need it.  We recommend signing up for the patient portal called "MyChart".  Sign up information is provided on this After Visit Summary.  MyChart is used to connect with patients for Virtual Visits (Telemedicine).  Patients are able to view lab/test results, encounter notes, upcoming appointments, etc.  Non-urgent messages can be sent to your provider as well.   To learn more about what you can do with MyChart, go to [email or web address].    Your next appointment:   6 month(s)  The format for your next appointment:   In Person  Provider:   Candee Furbish, MD   Thank you for choosing [facility]!!        Signed, Candee Furbish, MD  08/07/2019 3:35 PM    [place]

## 2019-08-07 NOTE — Patient Instructions (Signed)
Medication Instructions:  Please discontinue your Losartan.  Keep a log of your blood pressures and let us know in 1 month how they are running. Start Crestor 5 mg a day. Continue all other medications as listed.  *If you need a refill on your cardiac medications before your next appointment, please call your pharmacy*  Follow-Up: At [facility], you and your health needs are our priority.  As part of our continuing mission to provide you with exceptional heart care, we have created designated Provider Care Teams.  These Care Teams include your primary Cardiologist (physician) and Advanced Practice Providers (APPs -  Physician Assistants and Nurse Practitioners) who all work together to provide you with the care you need, when you need it.  We recommend signing up for the patient portal called "MyChart".  Sign up information is provided on this After Visit Summary.  MyChart is used to connect with patients for Virtual Visits (Telemedicine).  Patients are able to view lab/test results, encounter notes, upcoming appointments, etc.  Non-urgent messages can be sent to your provider as well.   To learn more about what you can do with MyChart, go to [email or web address].    Your next appointment:   6 month(s)  The format for your next appointment:   In Person  Provider:   Candee Furbish, MD   Thank you for choosing [facility]!!

## 2019-08-27 ENCOUNTER — Ambulatory Visit (INDEPENDENT_AMBULATORY_CARE_PROVIDER_SITE_OTHER): Payer: No Typology Code available for payment source | Admitting: Family Medicine

## 2019-08-27 ENCOUNTER — Other Ambulatory Visit: Payer: Self-pay

## 2019-08-27 VITALS — BP 120/84 | HR 87 | Temp 98.4°F | Ht 70.0 in | Wt 178.6 lb

## 2019-08-27 DIAGNOSIS — L989 Disorder of the skin and subcutaneous tissue, unspecified: Secondary | ICD-10-CM | POA: Diagnosis not present

## 2019-08-27 NOTE — Progress Notes (Signed)
   Subjective:    Patient ID: Darryl Black, male    DOB: Feb 04, 1966, 54 y.o.   MRN: [id number]  HPI Sound like he is here for evaluation of 2 lesions, one on the mid back and the other one on the left side.   Review of Systems     Objective:   Physical Exam The lesion on the mid left side back is 1 x 1-1/2 cm slightly pigmented with well-demarcated borders.  Lesion on the left torso is vascular roughly 1 cm in size, no clear margin but it is not waxy or showing evidence of recent bleeding.       Assessment & Plan:  Skin lesions Lesion in the mid back is definitely benign.  The lesion in the site is probably benign but does not have the appearance of any distinct cancer.  Picture was taken and he is to reevaluate this in approximately 6 months.

## 2019-10-10 ENCOUNTER — Telehealth: Payer: No Typology Code available for payment source | Admitting: Nurse Practitioner

## 2019-10-10 DIAGNOSIS — J019 Acute sinusitis, unspecified: Secondary | ICD-10-CM | POA: Diagnosis not present

## 2019-10-10 DIAGNOSIS — B9789 Other viral agents as the cause of diseases classified elsewhere: Secondary | ICD-10-CM | POA: Diagnosis not present

## 2019-10-10 MED ORDER — PSEUDOEPH-BROMPHEN-DM 30-2-10 MG/5ML PO SYRP: 5.0000 mL | ORAL_SOLUTION | Freq: Four times a day (QID) | ORAL | 0 refills | Status: AC | PRN

## 2019-10-10 MED ORDER — FLUTICASONE PROPIONATE 50 MCG/ACT NA SUSP: 2.0000 | Freq: Every day | NASAL | 0 refills | Status: DC

## 2019-10-10 NOTE — Progress Notes (Signed)
We are sorry that you are not feeling well.  Here is how we plan to help!  Based on what you have shared with me it looks like you have sinusitis.  Sinusitis is inflammation and infection in the sinus cavities of the head.  Based on your presentation I believe you most likely have Acute Viral Sinusitis.This is an infection most likely caused by a virus. There is not specific treatment for viral sinusitis other than to help you with the symptoms until the infection runs its course.  You may use an oral decongestant such as Mucinex D or if you have glaucoma or high blood pressure use plain Mucinex. Saline nasal spray help and can safely be used as often as needed for congestion, I have prescribed: Fluticasone nasal spray two sprays in each nostril once a day. I am also prescribing Bromfed cough syrup, take one teaspoon up to 4 times daily as needed.    Because COVID-19 can only be detected through a confirmed test, I would recommend testing due to the symptoms you are experiencing and how they are similar to COVID-19 symptoms.  In the interim, I am providing symptomatic treatment recommendations.    Testing Information: The COVID-19 Community Testing sites will begin testing BY APPOINTMENT ONLY.  You can schedule online at [email or web address]  If you do not have access to a smart phone or computer you may call [phone] for an appointment.   Additional testing sites in the Community:  . For [facility] Testing sites in [facility]  FaceUpdate.uy  . For Pop-up testing sites in [place]  BowlDirectory.co.uk  . For Testing sites with regular hours [email or web address]  . For [place] [email or web address].si  . For [facility]  BasicJet.[email or web address]  . For [facility] testing in [place] and [employer] [email or web address]  . For [facility] testing in [facility]   [email or web address].[email or web address]  For  more information about community testing call [phone]    Some authorities believe that zinc sprays or the use of Echinacea may shorten the course of your symptoms.  Sinus infections are not as easily transmitted as other respiratory infection, however we still recommend that you avoid close contact with loved ones, especially the very young and elderly.  Remember to wash your hands thoroughly throughout the day as this is the number one way to prevent the spread of infection!  Home Care:  Only take medications as instructed by your medical team.  Do not take these medications with alcohol.  A steam or ultrasonic humidifier can help congestion.  You can place a towel over your head and breathe in the steam from hot water coming from a faucet.  Avoid close contacts especially the very young and the elderly.  Cover your mouth when you cough or sneeze.  Always remember to wash your hands.  Get Help Right Away If:  You develop worsening fever or sinus pain.  You develop a severe head ache or visual changes.  Your symptoms persist after you have completed your treatment plan.  Make sure you  Understand these instructions.  Will watch your condition.  Will get help right away if you are not doing well or get worse.  Your e-visit answers were reviewed by a board certified advanced clinical practitioner to complete your personal care plan.  Depending on the condition, your plan could have included both over the counter or prescription medications.  If there is a problem please reply  once you have  received a  response from your provider.  Your safety is important to [place].  If you have drug allergies check your prescription carefully.    You can use MyChart to ask questions about today's visit, request a non-urgent call back, or ask for a work or school excuse for 24 hours related to this e-Visit. If it has been greater than 24 hours you will need to follow up with your provider, or enter a new e-Visit to address those concerns.  You will get an e-mail in the next two days asking about your experience.  I hope that your e-visit has been valuable and will speed your recovery. Thank you for using e-visits.   I have spent at least 5 minutes reviewing and documenting in the patient's chart.

## 2019-12-01 ENCOUNTER — Ambulatory Visit (INDEPENDENT_AMBULATORY_CARE_PROVIDER_SITE_OTHER): Payer: No Typology Code available for payment source | Admitting: Family Medicine

## 2019-12-01 ENCOUNTER — Other Ambulatory Visit: Payer: Self-pay

## 2019-12-01 ENCOUNTER — Encounter: Payer: Self-pay | Admitting: Family Medicine

## 2019-12-01 VITALS — BP 120/80 | HR 79 | Temp 97.6°F | Wt 177.2 lb

## 2019-12-01 DIAGNOSIS — E119 Type 2 diabetes mellitus without complications: Secondary | ICD-10-CM | POA: Diagnosis not present

## 2019-12-01 LAB — POCT GLYCOSYLATED HEMOGLOBIN (HGB A1C): Hemoglobin A1C: 5.4 % (ref 4.0–5.6)

## 2019-12-01 NOTE — Progress Notes (Signed)
   Subjective:    Patient ID: Darryl Black, male    DOB: 20-Jun-1965, 54 y.o.   MRN: [id number]  HPI He is here for recheck.  On his last visit hemoglobin A1c did go to 6.6.  Previously he had hemoglobin A1c rates in the low 6 range.  Since last being seen he has made dietary changes cut back on carbohydrates and is now walking on a regular basis.   Review of Systems     Objective:   Physical Exam Alert and in no distress.  Hemoglobin A1c is 5.4.       Assessment & Plan:  Controlled type 2 diabetes mellitus with complication, without long-term current use of insulin (HCC) - Plan: POCT glycosylated hemoglobin (Hb A1C)  Type 2 diabetes mellitus in remission ([place]) I explained that his new lifestyle in regard to carbohydrates and aerobics his greatly affected his diabetes status and we can now relabel him his diabetes in remission.  Encouraged him to continue with his present lifestyle and we will recheck this in 6 months.

## 2019-12-02 ENCOUNTER — Ambulatory Visit: Payer: No Typology Code available for payment source | Attending: Internal Medicine

## 2019-12-02 DIAGNOSIS — Z23 Encounter for immunization: Secondary | ICD-10-CM

## 2019-12-02 NOTE — Progress Notes (Signed)
   Covid-19 Vaccination Clinic  Name:  Darryl Black    MRN: [id number] DOB: 1965-03-31  12/02/2019  Mr. Darryl Black was observed post Covid-19 immunization for 15 minutes without incident. He was provided with Vaccine Information Sheet and instruction to access the V-Safe system.   Mr. Darryl Black was instructed to call 911 with any severe reactions post vaccine: Marland Kitchen Difficulty breathing  . Swelling of face and throat  . A fast heartbeat  . A bad rash all over body  . Dizziness and weakness

## 2019-12-19 ENCOUNTER — Ambulatory Visit: Payer: No Typology Code available for payment source

## 2020-02-02 ENCOUNTER — Other Ambulatory Visit: Payer: Self-pay | Admitting: Internal Medicine

## 2020-02-02 ENCOUNTER — Other Ambulatory Visit: Payer: Self-pay | Admitting: Family Medicine

## 2020-02-02 DIAGNOSIS — B20 Human immunodeficiency virus [HIV] disease: Secondary | ICD-10-CM

## 2020-02-02 NOTE — Telephone Encounter (Signed)
Darryl Black is requesting to fill pt [email or web address]. Please advise [facility]

## 2020-02-04 ENCOUNTER — Ambulatory Visit (INDEPENDENT_AMBULATORY_CARE_PROVIDER_SITE_OTHER): Payer: No Typology Code available for payment source | Admitting: Family Medicine

## 2020-02-04 ENCOUNTER — Other Ambulatory Visit: Payer: Self-pay

## 2020-02-04 ENCOUNTER — Other Ambulatory Visit: Payer: Self-pay | Admitting: Family Medicine

## 2020-02-04 VITALS — BP 128/82 | HR 66 | Temp 98.1°F | Wt 156.4 lb

## 2020-02-04 DIAGNOSIS — Z8619 Personal history of other infectious and parasitic diseases: Secondary | ICD-10-CM | POA: Diagnosis not present

## 2020-02-04 MED ORDER — VALACYCLOVIR HCL 1 G PO TABS: 1000.0000 mg | ORAL_TABLET | Freq: Every day | ORAL | 1 refills | Status: DC

## 2020-02-04 NOTE — Progress Notes (Signed)
   Subjective:    Patient ID: Darryl Black, male    DOB: 1965/04/26, 54 y.o.   MRN: [id number]  HPI He is here for consult concerning possible STD.  He has a recurrent history of penile lesion.  They usually occur on the same spot but do not cause any pain or major discomfort other than itching.   Review of Systems     Objective:   Physical Exam Exam of the head of the penis does show 3 small healing lesions that are not erythematous or tender to palpation       Assessment & Plan:  History of herpes genitalis - Plan: valACYclovir (VALTREX) 1000 MG tablet I explained that this is a classic reoccurrence pattern and did call in Valtrex.  Explained treatment for 5 days and then always have a spare available if needed.  He was comfortable with that.

## 2020-02-05 ENCOUNTER — Telehealth: Payer: Self-pay

## 2020-02-05 NOTE — Telephone Encounter (Signed)
Error

## 2020-02-12 ENCOUNTER — Other Ambulatory Visit: Payer: Self-pay

## 2020-02-12 ENCOUNTER — Ambulatory Visit (INDEPENDENT_AMBULATORY_CARE_PROVIDER_SITE_OTHER): Payer: No Typology Code available for payment source | Admitting: Cardiology

## 2020-02-12 ENCOUNTER — Encounter: Payer: Self-pay | Admitting: Cardiology

## 2020-02-12 VITALS — BP 138/78 | HR 63 | Ht 70.0 in | Wt 183.4 lb

## 2020-02-12 DIAGNOSIS — I10 Essential (primary) hypertension: Secondary | ICD-10-CM | POA: Diagnosis not present

## 2020-02-12 DIAGNOSIS — R079 Chest pain, unspecified: Secondary | ICD-10-CM | POA: Diagnosis not present

## 2020-02-12 DIAGNOSIS — N1831 Chronic kidney disease, stage 3a: Secondary | ICD-10-CM | POA: Diagnosis not present

## 2020-02-12 NOTE — Progress Notes (Signed)
Cardiology Office Note:    Date:  02/12/2020   ID:  Darryl Black, DOB 1966/01/22, MRN [id number]  PCP:  Denita Lung, MD  [facility] Cardiologist:  Candee Furbish, MD  [facility] Electrophysiologist:  None   Referring MD: Denita Lung, MD     History of Present Illness:    OREST DYGERT is a 54 y.o. male here for the follow-up of supraventricular tachycardia and prior chest discomfort.  Back in February 2021 chest discomfort was felt resting heart rate went up.  We ordered a coronary CT scan chronic kidney disease stage III noted with creatinine ranging from 1.4-1.9.  Therefore, the CT was not performed.  Overall he has been doing quite well without any chest discomfort.  No significant issues.  See below for further details.    Past Medical History:  Diagnosis Date   DVT (deep venous thrombosis) (HCC)    after half marathon    Dyslipidemia    GERD (gastroesophageal reflux disease)    Gilbert's disease    HH (hiatus hernia)    HIV positive ([place]) 89   Hyperlipidemia    Renal stone     Past Surgical History:  Procedure Laterality Date   APPENDECTOMY     COLONOSCOPY     last 10 years + ago 2002 in epic- colon all normal   SKIN BIOPSY Right 03/14/2018   seborrheic keratosis inflamed   UPPER GASTROINTESTINAL ENDOSCOPY      Current Medications: Current Meds  Medication Sig   BIKTARVY 50-200-25 MG TABS tablet TAKE 1 TABLET BY MOUTH ONCE DAILY   cholecalciferol (VITAMIN D) 1000 UNITS tablet Take 1,000 Units by mouth daily.   fenofibrate 160 MG tablet Take 1 tablet (160 mg total) by mouth daily.   fluticasone (FLONASE) 50 MCG/ACT nasal spray Place 2 sprays into both nostrils daily for 10 days.   icosapent Ethyl (VASCEPA) 1 g capsule Take 2 capsules (2 g total) by mouth 2 (two) times daily.   Multiple Vitamin (MULTIVITAMIN WITH MINERALS) TABS Take 1 tablet by mouth daily.   pantoprazole (PROTONIX) 40 MG tablet Take 1 tablet (40 mg total)  by mouth daily.   rosuvastatin (CRESTOR) 5 MG tablet Take 1 tablet (5 mg total) by mouth daily.   zolpidem (AMBIEN) 10 MG tablet Take 0.5-1 tablets (5-10 mg total) by mouth at bedtime as needed. for sleep     Allergies:   Patient has no known allergies.   Social History   Socioeconomic History   Marital status: Married    Spouse name: Not on file   Number of children: Not on file   Years of education: Not on file   Highest education level: Not on file  Occupational History   Occupation: Mental Health Counselor    Employer: [employer]  Tobacco Use   Smoking status: Never Smoker   Smokeless tobacco: Never Used  Substance and Sexual Activity   Alcohol use: No   Drug use: No   Sexual activity: Yes  Other Topics Concern   Not on file  Social History Narrative   Not on file   Social Determinants of Health   Financial Resource Strain: Not on file  Food Insecurity: Not on file  Transportation Needs: Not on file  Physical Activity: Not on file  Stress: Not on file  Social Connections: Not on file     Family History: The patient's family history includes Diabetes in his father; Heart disease in his maternal  grandfather and paternal grandfather; Pancreatic cancer in his mother. There is no history of Colon cancer, Colon polyps, Esophageal cancer, Rectal cancer, or Stomach cancer.  ROS:   Please see the history of present illness.     All other systems reviewed and are negative.  EKGs/Labs/Other Studies Reviewed:    The following studies were reviewed today:   ECHO 06/19/19 1. Normal GLS -21.6. Left ventricular ejection fraction, by estimation,  is 55 to 60%. The left ventricle has normal function. The left ventricle  has no regional wall motion abnormalities. Left ventricular diastolic  parameters were normal.  2. Right ventricular systolic function is normal. The right ventricular  size is normal. There is normal pulmonary artery systolic  pressure.  3. The mitral valve is normal in structure. Trivial mitral valve  regurgitation. No evidence of mitral stenosis.  4. The aortic valve is tricuspid. Aortic valve regurgitation is not  visualized. Mild to moderate aortic valve sclerosis/calcification is  present, without any evidence of aortic stenosis.  5. The inferior vena cava is normal in size with greater than 50%  respiratory variability, suggesting right atrial pressure of 3 mmHg.    Recent Labs: 04/22/2019: BNP 8.5 07/29/2019: ALT 54; BUN 13; Creatinine, Ser 1.72; Hemoglobin 16.8; Platelets 223; Potassium 4.2; Sodium 138  Recent Lipid Panel    Component Value Date/Time   CHOL 187 07/29/2019 1118   TRIG 156 (H) 07/29/2019 1118   HDL 37 (L) 07/29/2019 1118   CHOLHDL 5.1 (H) 07/29/2019 1118   CHOLHDL 5.8 (H) 03/01/2017 1150   VLDL 44 (H) 06/27/2016 0933   LDLCALC 122 (H) 07/29/2019 1118   LDLCALC 120 (H) 03/01/2017 1150     Risk Assessment/Calculations:       Physical Exam:    VS:  BP 138/78 (BP Location: Left Arm, Patient Position: Sitting, Cuff Size: Normal)    Pulse 63    Ht 5\' 10"  (1.778 m)    Wt 183 lb 6.4 oz (83.2 kg)    SpO2 99%    BMI 26.32 kg/m     Wt Readings from Last 3 Encounters:  02/12/20 183 lb 6.4 oz (83.2 kg)  02/04/20 156 lb 6.4 oz (70.9 kg)  12/01/19 177 lb 3.2 oz (80.4 kg)     GEN:  Well nourished, well developed in no acute distress HEENT: Normal NECK: No JVD; No carotid bruits LYMPHATICS: No lymphadenopathy CARDIAC: RRR, no murmurs, rubs, gallops RESPIRATORY:  Clear to auscultation without rales, wheezing or rhonchi  ABDOMEN: Soft, non-tender, non-distended MUSCULOSKELETAL:  No edema; No deformity  SKIN: Warm and dry NEUROLOGIC:  Alert and oriented x 3 PSYCHIATRIC:  Normal affect   ASSESSMENT:    1. Chest pain, unspecified type   2. Stage 3a chronic kidney disease ([place])   3. Essential hypertension    PLAN:    In order of problems listed above:  Chest  discomfort -Prior CT was unable to be done because of creatinine 1.9.  If chest pain returns, we could consider some further stress testing.  Hypertension -I stopped his losartan 50 mg at last clinic because of blood pressure 98/66.  Current blood pressure 138/78.  Improved.  No more lightheadedness.  He showed me a list of his blood pressures.  Overall excellent.  HIV -Well-controlled on Biktarvy.  Chronic kidney disease stage III -1.4-1.9.  Covid infection in August -Had runny nose, mildly sore throat.  Lasted 3 days.  No significant other symptoms.       Medication Adjustments/Labs and Tests  Ordered: Current medicines are reviewed at length with the patient today.  Concerns regarding medicines are outlined above.  No orders of the defined types were placed in this encounter.  No orders of the defined types were placed in this encounter.   Patient Instructions  Medication Instructions:  The current medical regimen is effective;  continue present plan and medications.  *If you need a refill on your cardiac medications before your next appointment, please call your pharmacy*  Follow-Up: At [facility], you and your health needs are our priority.  As part of our continuing mission to provide you with exceptional heart care, we have created designated Provider Care Teams.  These Care Teams include your primary Cardiologist (physician) and Advanced Practice Providers (APPs -  Physician Assistants and Nurse Practitioners) who all work together to provide you with the care you need, when you need it.  We recommend signing up for the patient portal called "MyChart".  Sign up information is provided on this After Visit Summary.  MyChart is used to connect with patients for Virtual Visits (Telemedicine).  Patients are able to view lab/test results, encounter notes, upcoming appointments, etc.  Non-urgent messages can be sent to your provider as well.   To learn more about what you can  do with MyChart, go to [email or web address].    Your next appointment:   12 month(s)  The format for your next appointment:   In Person  Provider:   Candee Furbish, MD   Thank you for choosing [facility]!!         Signed, Candee Furbish, MD  02/12/2020 1:41 PM    [place]

## 2020-02-12 NOTE — Patient Instructions (Signed)
Medication Instructions:  The current medical regimen is effective;  continue present plan and medications.  *If you need a refill on your cardiac medications before your next appointment, please call your pharmacy*  Follow-Up: At [facility], you and your health needs are our priority.  As part of our continuing mission to provide you with exceptional heart care, we have created designated Provider Care Teams.  These Care Teams include your primary Cardiologist (physician) and Advanced Practice Providers (APPs -  Physician Assistants and Nurse Practitioners) who all work together to provide you with the care you need, when you need it.  We recommend signing up for the patient portal called "MyChart".  Sign up information is provided on this After Visit Summary.  MyChart is used to connect with patients for Virtual Visits (Telemedicine).  Patients are able to view lab/test results, encounter notes, upcoming appointments, etc.  Non-urgent messages can be sent to your provider as well.   To learn more about what you can do with MyChart, go to [email or web address].    Your next appointment:   12 month(s)  The format for your next appointment:   In Person  Provider:   Mark Skains, MD   Thank you for choosing [facility]!!      

## 2020-02-24 ENCOUNTER — Other Ambulatory Visit: Payer: Self-pay | Admitting: Pharmacist

## 2020-02-24 DIAGNOSIS — B20 Human immunodeficiency virus [HIV] disease: Secondary | ICD-10-CM

## 2020-02-24 MED ORDER — BIKTARVY 50-200-25 MG PO TABS: 1.0000 | ORAL_TABLET | Freq: Every day | ORAL | 3 refills | Status: DC

## 2020-02-24 NOTE — Progress Notes (Signed)
Refill sent in by patient's doctor, Sharlot Gowda. Resent Rx to [facility]. Will reach out to patient to see if he has any issues or questions regarding his medication.

## 2020-04-23 ENCOUNTER — Other Ambulatory Visit: Payer: Self-pay | Admitting: Family Medicine

## 2020-04-23 DIAGNOSIS — E781 Pure hyperglyceridemia: Secondary | ICD-10-CM

## 2020-05-28 ENCOUNTER — Other Ambulatory Visit (HOSPITAL_COMMUNITY): Payer: Self-pay

## 2020-05-30 IMAGING — CR DG CHEST 2V
2 series · 2 of 2 positions shown · non-contrast
Comparison: Chest x-ray 04/08/2012.

CLINICAL DATA: 54-year-old male with history of dyspnea on
exertion. Night sweats.

EXAM:
CHEST - 2 VIEW

[w chest pa]
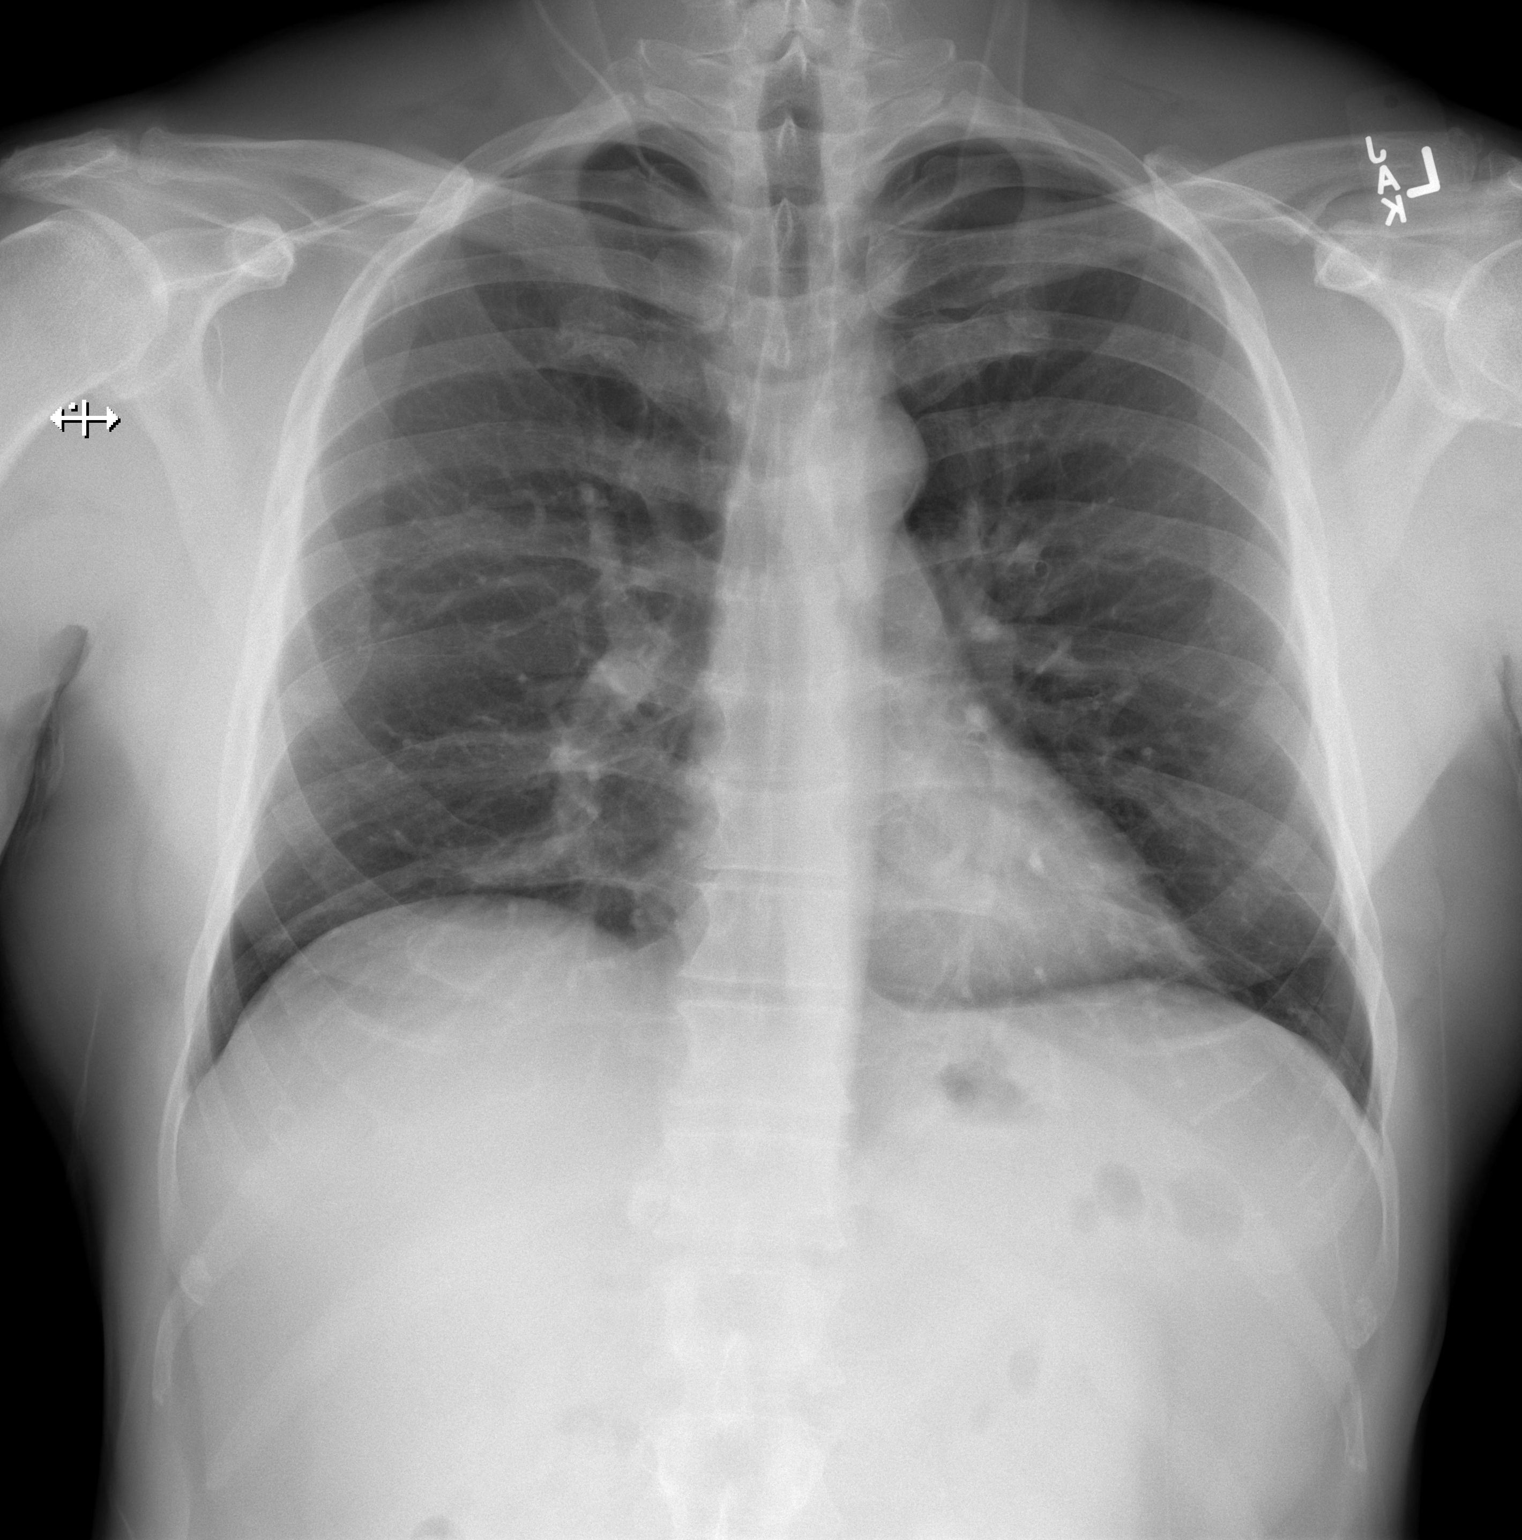

[w chest lat]
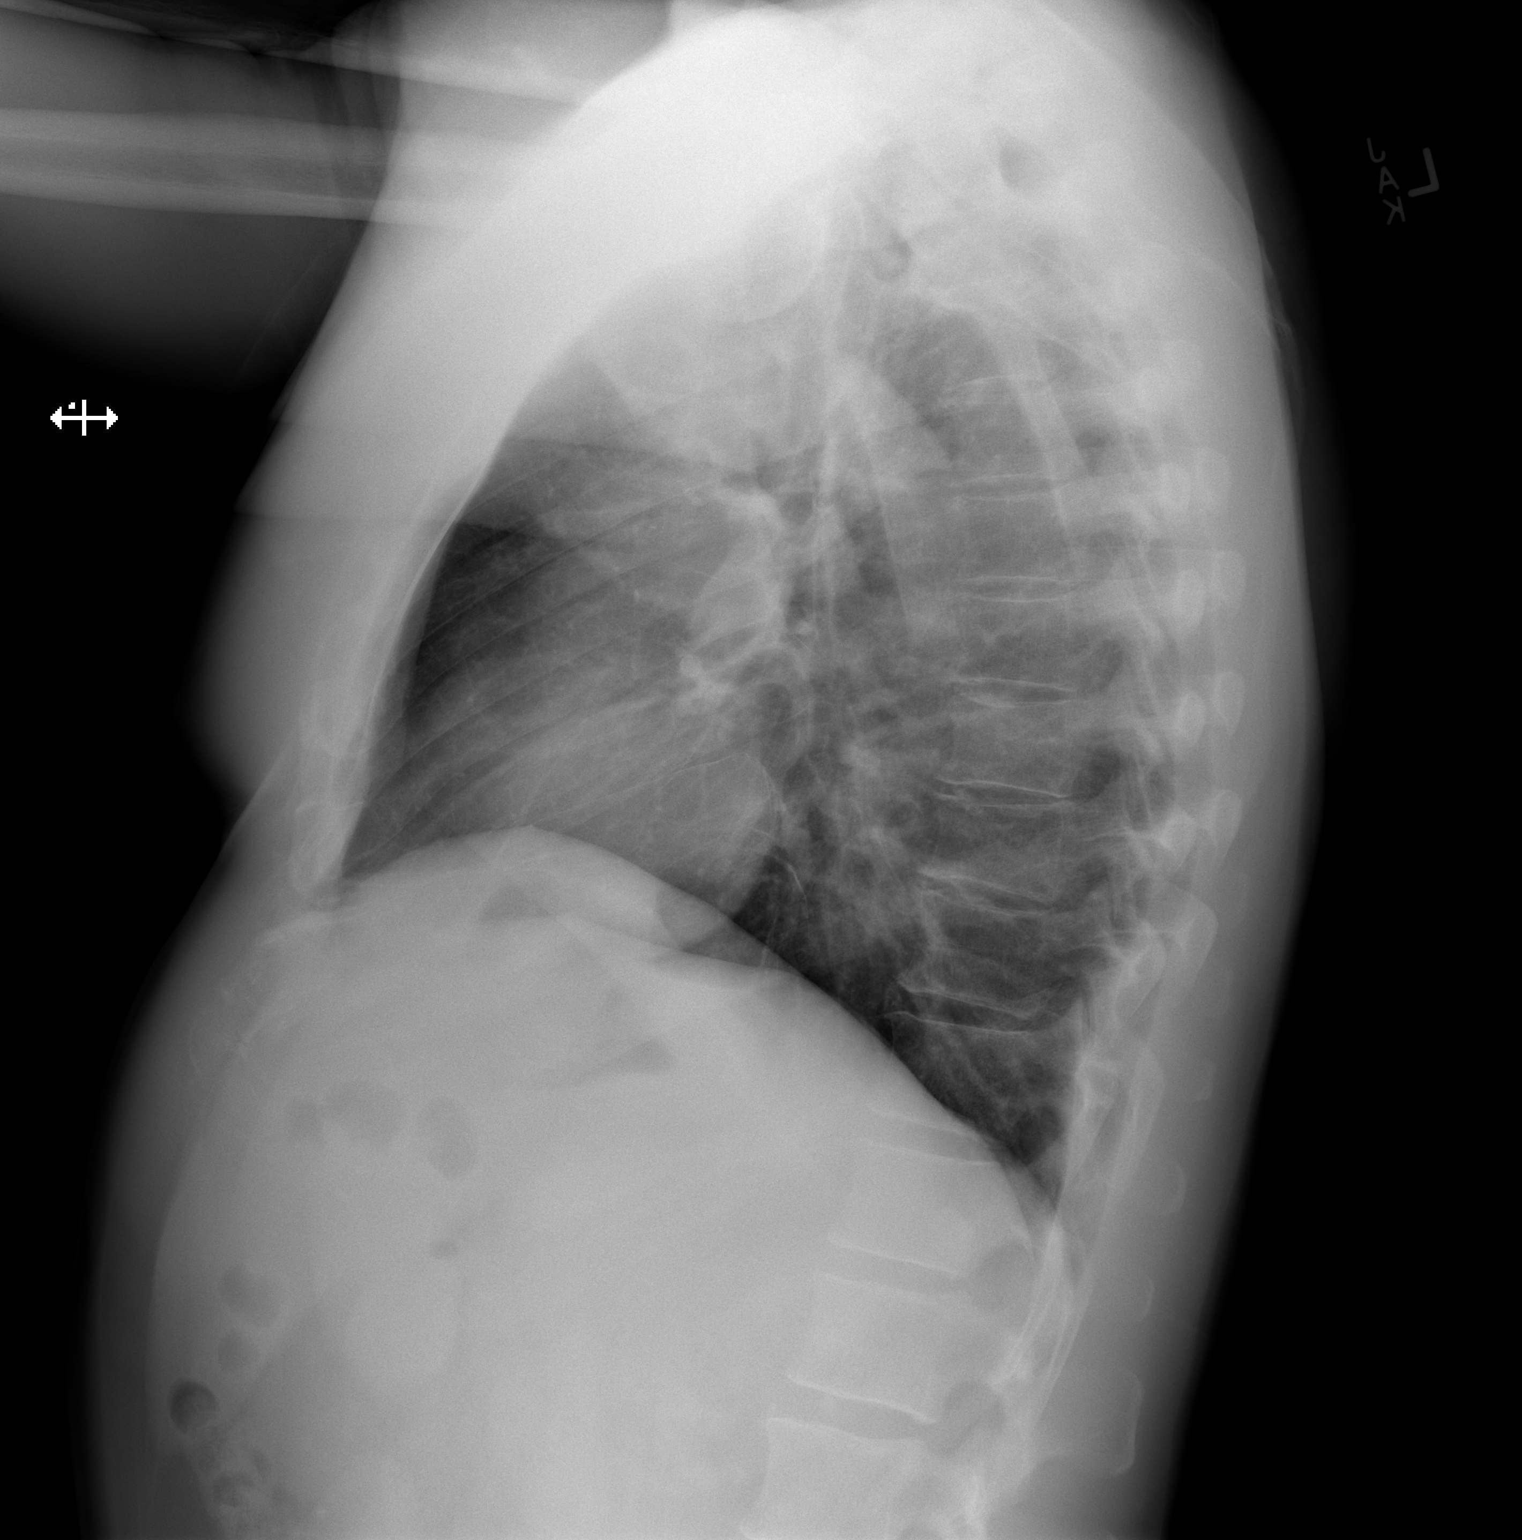

[2 of 2 positions shown; findings below may reference images not displayed]

FINDINGS: Lung volumes are normal. No consolidative airspace disease. No
pleural effusions. No pneumothorax. No pulmonary nodule or mass
noted. Pulmonary vasculature and the cardiomediastinal silhouette
are within normal limits. Aortic atherosclerosis.
IMPRESSION: 1.  No radiographic evidence of acute cardiopulmonary disease.
2. Aortic atherosclerosis.

## 2020-06-01 ENCOUNTER — Other Ambulatory Visit (HOSPITAL_COMMUNITY): Payer: Self-pay

## 2020-06-07 ENCOUNTER — Other Ambulatory Visit: Payer: Self-pay | Admitting: Family Medicine

## 2020-06-07 ENCOUNTER — Other Ambulatory Visit (HOSPITAL_BASED_OUTPATIENT_CLINIC_OR_DEPARTMENT_OTHER): Payer: Self-pay

## 2020-06-07 DIAGNOSIS — Z8719 Personal history of other diseases of the digestive system: Secondary | ICD-10-CM

## 2020-06-07 MED ORDER — PANTOPRAZOLE SODIUM 40 MG PO TBEC
40.0000 mg | DELAYED_RELEASE_TABLET | Freq: Every day | ORAL | 0 refills | Status: DC
  Filled 2020-06-07: qty 90, 90d supply, fill #0

## 2020-06-08 ENCOUNTER — Other Ambulatory Visit (HOSPITAL_BASED_OUTPATIENT_CLINIC_OR_DEPARTMENT_OTHER): Payer: Self-pay

## 2020-06-18 ENCOUNTER — Other Ambulatory Visit (HOSPITAL_COMMUNITY): Payer: Self-pay

## 2020-06-18 MED FILL — Bictegravir-Emtricitabine-Tenofovir AF Tab 50-200-25 MG: ORAL | 30 days supply | Qty: 30 | Fill #0 | Status: AC

## 2020-06-22 ENCOUNTER — Other Ambulatory Visit (HOSPITAL_COMMUNITY): Payer: Self-pay

## 2020-07-21 ENCOUNTER — Other Ambulatory Visit (HOSPITAL_COMMUNITY): Payer: Self-pay

## 2020-07-21 MED FILL — Bictegravir-Emtricitabine-Tenofovir AF Tab 50-200-25 MG: ORAL | 30 days supply | Qty: 30 | Fill #1 | Status: AC

## 2020-07-28 ENCOUNTER — Other Ambulatory Visit: Payer: Self-pay | Admitting: Family Medicine

## 2020-07-28 ENCOUNTER — Other Ambulatory Visit (HOSPITAL_BASED_OUTPATIENT_CLINIC_OR_DEPARTMENT_OTHER): Payer: Self-pay

## 2020-07-28 ENCOUNTER — Other Ambulatory Visit (HOSPITAL_COMMUNITY): Payer: Self-pay

## 2020-07-28 DIAGNOSIS — E781 Pure hyperglyceridemia: Secondary | ICD-10-CM

## 2020-07-28 MED ORDER — FENOFIBRATE 160 MG PO TABS
ORAL_TABLET | Freq: Every day | ORAL | 0 refills | Status: DC
  Filled 2020-07-28: qty 90, 90d supply, fill #0

## 2020-07-29 ENCOUNTER — Encounter: Payer: No Typology Code available for payment source | Admitting: Family Medicine

## 2020-08-04 ENCOUNTER — Other Ambulatory Visit (HOSPITAL_BASED_OUTPATIENT_CLINIC_OR_DEPARTMENT_OTHER): Payer: Self-pay

## 2020-08-04 ENCOUNTER — Other Ambulatory Visit: Payer: Self-pay | Admitting: Cardiology

## 2020-08-04 MED ORDER — ROSUVASTATIN CALCIUM 5 MG PO TABS
ORAL_TABLET | Freq: Every day | ORAL | 1 refills | Status: DC
Start: 2020-08-04 — End: 2020-08-20
  Filled 2020-08-04: qty 90, 90d supply, fill #0

## 2020-08-05 ENCOUNTER — Other Ambulatory Visit (HOSPITAL_BASED_OUTPATIENT_CLINIC_OR_DEPARTMENT_OTHER): Payer: Self-pay

## 2020-08-12 ENCOUNTER — Other Ambulatory Visit (HOSPITAL_BASED_OUTPATIENT_CLINIC_OR_DEPARTMENT_OTHER): Payer: Self-pay

## 2020-08-12 ENCOUNTER — Other Ambulatory Visit: Payer: Self-pay | Admitting: Family Medicine

## 2020-08-12 DIAGNOSIS — E781 Pure hyperglyceridemia: Secondary | ICD-10-CM

## 2020-08-12 MED ORDER — ICOSAPENT ETHYL 1 G PO CAPS
2.0000 g | ORAL_CAPSULE | Freq: Two times a day (BID) | ORAL | 1 refills | Status: DC
  Filled 2020-08-12: qty 360, 90d supply, fill #0

## 2020-08-13 ENCOUNTER — Other Ambulatory Visit (HOSPITAL_BASED_OUTPATIENT_CLINIC_OR_DEPARTMENT_OTHER): Payer: Self-pay

## 2020-08-20 ENCOUNTER — Encounter: Payer: Self-pay | Admitting: Family Medicine

## 2020-08-20 ENCOUNTER — Ambulatory Visit (INDEPENDENT_AMBULATORY_CARE_PROVIDER_SITE_OTHER): Payer: No Typology Code available for payment source | Admitting: Family Medicine

## 2020-08-20 ENCOUNTER — Other Ambulatory Visit: Payer: Self-pay

## 2020-08-20 ENCOUNTER — Other Ambulatory Visit (HOSPITAL_BASED_OUTPATIENT_CLINIC_OR_DEPARTMENT_OTHER): Payer: Self-pay

## 2020-08-20 VITALS — BP 110/68 | HR 80 | Temp 98.2°F | Ht 70.0 in | Wt 182.2 lb

## 2020-08-20 DIAGNOSIS — N1831 Chronic kidney disease, stage 3a: Secondary | ICD-10-CM

## 2020-08-20 DIAGNOSIS — Z8619 Personal history of other infectious and parasitic diseases: Secondary | ICD-10-CM | POA: Diagnosis not present

## 2020-08-20 DIAGNOSIS — E119 Type 2 diabetes mellitus without complications: Secondary | ICD-10-CM

## 2020-08-20 DIAGNOSIS — E781 Pure hyperglyceridemia: Secondary | ICD-10-CM

## 2020-08-20 DIAGNOSIS — Z0000 Encounter for general adult medical examination without abnormal findings: Secondary | ICD-10-CM

## 2020-08-20 DIAGNOSIS — B20 Human immunodeficiency virus [HIV] disease: Secondary | ICD-10-CM | POA: Diagnosis not present

## 2020-08-20 DIAGNOSIS — Z8719 Personal history of other diseases of the digestive system: Secondary | ICD-10-CM

## 2020-08-20 DIAGNOSIS — F5102 Adjustment insomnia: Secondary | ICD-10-CM

## 2020-08-20 DIAGNOSIS — I700 Atherosclerosis of aorta: Secondary | ICD-10-CM

## 2020-08-20 DIAGNOSIS — K449 Diaphragmatic hernia without obstruction or gangrene: Secondary | ICD-10-CM

## 2020-08-20 DIAGNOSIS — Z23 Encounter for immunization: Secondary | ICD-10-CM

## 2020-08-20 DIAGNOSIS — E785 Hyperlipidemia, unspecified: Secondary | ICD-10-CM

## 2020-08-20 LAB — POCT GLYCOSYLATED HEMOGLOBIN (HGB A1C): Hemoglobin A1C: 5.9 % — AB (ref 4.0–5.6)

## 2020-08-20 MED ORDER — ICOSAPENT ETHYL 1 G PO CAPS
2.0000 g | ORAL_CAPSULE | Freq: Two times a day (BID) | ORAL | 3 refills | Status: DC
  Filled 2020-08-20 – 2021-01-31 (×2): qty 360, 90d supply, fill #0
  Filled 2021-08-13: qty 360, 90d supply, fill #1

## 2020-08-20 MED ORDER — ZOLPIDEM TARTRATE 10 MG PO TABS
5.0000 mg | ORAL_TABLET | Freq: Every evening | ORAL | 1 refills | Status: DC | PRN
  Filled 2020-08-20: qty 30, 30d supply, fill #0

## 2020-08-20 MED ORDER — VALACYCLOVIR HCL 1 G PO TABS
1000.0000 mg | ORAL_TABLET | Freq: Every day | ORAL | 1 refills | Status: DC
  Filled 2020-08-20: qty 30, 30d supply, fill #0
  Filled 2020-09-21: qty 30, 30d supply, fill #1

## 2020-08-20 MED ORDER — ROSUVASTATIN CALCIUM 5 MG PO TABS
ORAL_TABLET | Freq: Every day | ORAL | 3 refills | Status: DC
  Filled 2020-08-20: qty 90, fill #0
  Filled 2020-11-02: qty 90, 90d supply, fill #0
  Filled 2021-02-02: qty 90, 90d supply, fill #1
  Filled 2021-04-27: qty 90, 90d supply, fill #2
  Filled 2021-08-02: qty 90, 90d supply, fill #3

## 2020-08-20 MED ORDER — PANTOPRAZOLE SODIUM 40 MG PO TBEC
40.0000 mg | DELAYED_RELEASE_TABLET | Freq: Every day | ORAL | 3 refills | Status: DC
  Filled 2020-08-20: qty 90, 90d supply, fill #0
  Filled 2021-01-17: qty 90, 90d supply, fill #1
  Filled 2021-05-03: qty 90, 90d supply, fill #2
  Filled 2021-08-02: qty 90, 90d supply, fill #3

## 2020-08-20 MED ORDER — BICTEGRAVIR-EMTRICITAB-TENOFOV 50-200-25 MG PO TABS
1.0000 | ORAL_TABLET | Freq: Every day | ORAL | 3 refills | Status: DC
  Filled 2020-08-20: qty 30, 30d supply, fill #0

## 2020-08-20 MED ORDER — FENOFIBRATE 160 MG PO TABS
ORAL_TABLET | Freq: Every day | ORAL | 3 refills | Status: DC
  Filled 2020-08-20: qty 90, fill #0
  Filled 2020-10-19: qty 90, 90d supply, fill #0
  Filled 2021-01-18: qty 90, 90d supply, fill #1
  Filled 2021-04-27: qty 90, 90d supply, fill #2
  Filled 2021-07-18: qty 90, 90d supply, fill #3

## 2020-08-20 NOTE — Progress Notes (Signed)
   Subjective:    Patient ID: Darryl Black, male    DOB: 1965/04/08, 55 y.o.   MRN: [id number]  HPI He is here for complete examination.  He continues on Biktarvy for his HIV disease and is having no difficulty with that.  He is taking fenofibrate, Vascepa and Crestor for his hyperlipidemia and is doing well on that.  Takes Protonix on a regular basis for his hiatus hernia is also esophageal stricture.  He would like a refill on his Ambien and Valtrex.  He has not had any outbreaks of herpes and does using Ambien for his work-related sleep disturbance.  Does have a previous history of an A1c of 6.6 but since then he has made some dietary changes and has lowered his A1c.  He is now technically diabetes in remission.  He has had some difficulty with heart related issues and has seen cardiology and is now seeing cardiologist once per year.  He does have blood work showing CKD.  Also x-ray evidence of aortic atherosclerosis. He does not smoke or drink and does exercise regularly.  His work and home life are going well.  Family and social history as well as health maintenance and immunizations was reviewed.  Review of Systems  All other systems reviewed and are negative.     Objective:   Physical Exam Alert and in no distress. Tympanic membranes and canals are normal. Pharyngeal area is normal. Neck is supple without adenopathy or thyromegaly. Cardiac exam shows a regular sinus rhythm without murmurs or gallops. Lungs are clear to auscultation. Abdominal exam shows no masses or tenderness. 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[place]) - Plan: POCT glycosylated hemoglobin (Hb A1C)  Aortic atherosclerosis  (HCC)  HIV disease (HCC) - Plan: HIV-1 RNA quant-no reflex-bld, T-helper cells (CD4) count (not at [facility]), bictegravir-emtricitabine-tenofovir AF (BIKTARVY) 50-200-25 MG TABS tablet  Hyperlipidemia, unspecified hyperlipidemia type - Plan: Lipid panel, fenofibrate 160 MG tablet, icosapent Ethyl (VASCEPA) 1 g capsule, rosuvastatin (CRESTOR) 5 MG tablet  History of esophageal stricture - Plan: pantoprazole (PROTONIX) 40 MG tablet  Gilbert's disease  HH (hiatus hernia)  Stage 3a chronic kidney disease ([place])  Need for Tdap vaccination - Plan: Tdap vaccine greater than or equal to 7yo IM  Hypertriglyceridemia - Plan: fenofibrate 160 MG tablet, icosapent Ethyl (VASCEPA) 1 g capsule  Transient insomnia of non-organic origin - Plan: zolpidem (AMBIEN) 10 MG tablet Discussed the previous history of diabetes with him.  He is doing much better job of taking care of himself.  Encouraged him to continue with diet and exercise.

## 2020-08-21 LAB — COMPREHENSIVE METABOLIC PANEL
ALT: 25 IU/L (ref 0–44)
AST: 19 IU/L (ref 0–40)
Albumin/Globulin Ratio: 2 (ref 1.2–2.2)
Albumin: 4.9 g/dL (ref 3.8–4.9)
Alkaline Phosphatase: 54 IU/L (ref 44–121)
BUN/Creatinine Ratio: 8 — ABNORMAL LOW (ref 9–20)
BUN: 14 mg/dL (ref 6–24)
Bilirubin Total: 1.3 mg/dL — ABNORMAL HIGH (ref 0.0–1.2)
CO2: 24 mmol/L (ref 20–29)
Calcium: 10.3 mg/dL — ABNORMAL HIGH (ref 8.7–10.2)
Chloride: 100 mmol/L (ref 96–106)
Creatinine, Ser: 1.67 mg/dL — ABNORMAL HIGH (ref 0.76–1.27)
Globulin, Total: 2.4 g/dL (ref 1.5–4.5)
Glucose: 88 mg/dL (ref 65–99)
Potassium: 4.3 mmol/L (ref 3.5–5.2)
Sodium: 139 mmol/L (ref 134–144)
Total Protein: 7.3 g/dL (ref 6.0–8.5)
eGFR: 48 mL/min/{1.73_m2} — ABNORMAL LOW (ref >59)

## 2020-08-21 LAB — T-HELPER CELLS (CD4) COUNT (NOT AT ARMC)
% CD 4 Pos. Lymph.: 33.3 % (ref 30.8–58.5)
Absolute CD 4 Helper: 1066 /uL (ref 359–1519)
Basophils Absolute: 0.1 10*3/uL (ref 0.0–0.2)
Basos: 1 %
EOS (ABSOLUTE): 0.1 10*3/uL (ref 0.0–0.4)
Eos: 1 %
Hematocrit: 47.5 % (ref 37.5–51.0)
Hemoglobin: 16.9 g/dL (ref 13.0–17.7)
Immature Grans (Abs): 0 10*3/uL (ref 0.0–0.1)
Immature Granulocytes: 0 %
Lymphocytes Absolute: 3.2 10*3/uL — ABNORMAL HIGH (ref 0.7–3.1)
Lymphs: 36 %
MCH: 34 pg — ABNORMAL HIGH (ref 26.6–33.0)
MCHC: 35.6 g/dL (ref 31.5–35.7)
MCV: 96 fL (ref 79–97)
Monocytes Absolute: 0.9 10*3/uL (ref 0.1–0.9)
Monocytes: 10 %
Neutrophils Absolute: 4.5 10*3/uL (ref 1.4–7.0)
Neutrophils: 52 %
Platelets: 241 10*3/uL (ref 150–450)
RBC: 4.97 x10E6/uL (ref 4.14–5.80)
RDW: 12.2 % (ref 11.6–15.4)
WBC: 8.7 10*3/uL (ref 3.4–10.8)

## 2020-08-21 LAB — LIPID PANEL
Chol/HDL Ratio: 3 ratio (ref 0.0–5.0)
Cholesterol, Total: 137 mg/dL (ref 100–199)
HDL: 46 mg/dL (ref >39)
LDL Chol Calc (NIH): 70 mg/dL (ref 0–99)
Triglycerides: 119 mg/dL (ref 0–149)
VLDL Cholesterol Cal: 21 mg/dL (ref 5–40)

## 2020-08-21 LAB — HIV-1 RNA QUANT-NO REFLEX-BLD
HIV-1 RNA Viral Load Log: 1.602 log10copy/mL
HIV-1 RNA Viral Load: 40 copies/mL

## 2020-08-23 ENCOUNTER — Other Ambulatory Visit (HOSPITAL_BASED_OUTPATIENT_CLINIC_OR_DEPARTMENT_OTHER): Payer: Self-pay

## 2020-08-23 ENCOUNTER — Other Ambulatory Visit (HOSPITAL_COMMUNITY): Payer: Self-pay

## 2020-08-24 ENCOUNTER — Other Ambulatory Visit (HOSPITAL_BASED_OUTPATIENT_CLINIC_OR_DEPARTMENT_OTHER): Payer: Self-pay

## 2020-08-24 ENCOUNTER — Other Ambulatory Visit: Payer: Self-pay | Admitting: Pharmacist

## 2020-08-24 ENCOUNTER — Other Ambulatory Visit (HOSPITAL_COMMUNITY): Payer: Self-pay

## 2020-08-24 DIAGNOSIS — B20 Human immunodeficiency virus [HIV] disease: Secondary | ICD-10-CM

## 2020-08-24 MED ORDER — BICTEGRAVIR-EMTRICITAB-TENOFOV 50-200-25 MG PO TABS
1.0000 | ORAL_TABLET | Freq: Every day | ORAL | 3 refills | Status: DC
  Filled 2020-08-24: qty 30, 30d supply, fill #0
  Filled 2020-09-27: qty 30, 30d supply, fill #1
  Filled 2020-11-02: qty 30, 30d supply, fill #2
  Filled 2020-11-24: qty 30, 30d supply, fill #3
  Filled 2020-12-21: qty 30, 30d supply, fill #4
  Filled 2021-01-18: qty 30, 30d supply, fill #5
  Filled 2021-02-16: qty 30, 30d supply, fill #6
  Filled 2021-03-21: qty 30, 30d supply, fill #7
  Filled 2021-04-19: qty 30, 30d supply, fill #8
  Filled 2021-05-13: qty 30, 30d supply, fill #9
  Filled 2021-06-10: qty 30, 30d supply, fill #10
  Filled 2021-07-07: qty 30, 30d supply, fill #11

## 2020-09-22 ENCOUNTER — Other Ambulatory Visit (HOSPITAL_BASED_OUTPATIENT_CLINIC_OR_DEPARTMENT_OTHER): Payer: Self-pay

## 2020-09-27 ENCOUNTER — Other Ambulatory Visit (HOSPITAL_COMMUNITY): Payer: Self-pay

## 2020-09-30 ENCOUNTER — Other Ambulatory Visit (HOSPITAL_COMMUNITY): Payer: Self-pay

## 2020-10-05 ENCOUNTER — Other Ambulatory Visit (HOSPITAL_BASED_OUTPATIENT_CLINIC_OR_DEPARTMENT_OTHER): Payer: Self-pay

## 2020-10-05 ENCOUNTER — Ambulatory Visit: Payer: No Typology Code available for payment source | Attending: Internal Medicine

## 2020-10-05 DIAGNOSIS — Z23 Encounter for immunization: Secondary | ICD-10-CM

## 2020-10-05 MED ORDER — PFIZER-BIONT COVID-19 VAC-TRIS 30 MCG/0.3ML IM SUSP
INTRAMUSCULAR | 0 refills | Status: DC
  Filled 2020-10-05: qty 0.3, 1d supply, fill #0

## 2020-10-05 NOTE — Progress Notes (Signed)
   Covid-19 Vaccination Clinic  Name:  Darryl Black    MRN: [id number] DOB: [birth year]  10/05/2020  Mr. Darryl Black was observed post Covid-19 immunization for 15 minutes without incident. He was provided with Vaccine Information Sheet and instruction to access the V-Safe system.   Mr. Darryl Black was instructed to call 911 with any severe reactions post vaccine: Difficulty breathing  Swelling of face and throat  A fast heartbeat  A bad rash all over body  Dizziness and weakness   Immunizations Administered     Name Date Dose VIS Date Route   PFIZER Comrnaty(Gray TOP) Covid-19 Vaccine 10/05/2020  3:43 PM 0.3 mL 02/05/2020 Intramuscular   Manufacturer: [place]   Lot: [id number]   NDC: [phone]

## 2020-10-19 ENCOUNTER — Other Ambulatory Visit (HOSPITAL_BASED_OUTPATIENT_CLINIC_OR_DEPARTMENT_OTHER): Payer: Self-pay

## 2020-10-27 ENCOUNTER — Ambulatory Visit (INDEPENDENT_AMBULATORY_CARE_PROVIDER_SITE_OTHER): Payer: No Typology Code available for payment source | Admitting: Pharmacist

## 2020-10-27 ENCOUNTER — Other Ambulatory Visit: Payer: Self-pay

## 2020-10-27 DIAGNOSIS — B20 Human immunodeficiency virus [HIV] disease: Secondary | ICD-10-CM

## 2020-10-27 NOTE — Progress Notes (Signed)
Virtual Visit via Telephone Note  I connected with Darryl Black on 10/27/20 at 12:00 PM EDT by telephone and verified that I am speaking with the correct person using two identifiers.  Location: Patient: Home Provider: Office - [facility]   I discussed the limitations, risks, security and privacy concerns of performing an evaluation and management service by telephone and the availability of in person appointments. I also discussed with the patient that there may be a patient responsible charge related to this service. The patient expressed understanding and agreed to proceed.  HPI: Darryl Black is a 55 y.o. male who presents for follow-up of their long-term specialty medication, Biktarvy.  Patient Active Problem List   Diagnosis Date Noted   Aortic atherosclerosis ([place]) 04/23/2019   Type 2 diabetes mellitus in remission ([place]) 01/31/2019   Stage 3a chronic kidney disease ([place]) 01/30/2019   History of esophageal stricture 06/15/2015   History of herpes genitalis 12/01/2014   H/O herpes labialis 12/01/2014   Gilbert's disease 06/03/2014   HH (hiatus hernia) 04/15/2012   History of renal stone 04/15/2012   Hyperlipidemia 04/11/2011   HIV disease ([place]) 04/11/2011    Patient's Medications  New Prescriptions   No medications on file  Previous Medications   BICTEGRAVIR-EMTRICITABINE-TENOFOVIR AF (BIKTARVY) 50-200-25 MG TABS TABLET    Take 1 tablet by mouth daily.   CHOLECALCIFEROL (VITAMIN D) 1000 UNITS TABLET    Take 1,000 Units by mouth daily.   COVID-19 MRNA VAC-TRIS, PFIZER, (PFIZER-BIONT COVID-19 VAC-TRIS) SUSP INJECTION    Inject into the muscle.   FENOFIBRATE 160 MG TABLET    TAKE 1 TABLET BY MOUTH ONCE A DAY   ICOSAPENT ETHYL (VASCEPA) 1 G CAPSULE    Take 2 capsules (2 g total) by mouth 2 (two) times daily.   MULTIPLE VITAMIN (MULTIVITAMIN WITH MINERALS) TABS    Take 1 tablet by mouth daily.   PANTOPRAZOLE (PROTONIX) 40 MG TABLET    Take 1 tablet (40 mg total) by mouth daily.    ROSUVASTATIN (CRESTOR) 5 MG TABLET    TAKE 1 TABLET BY MOUTH DAILY   VALACYCLOVIR (VALTREX) 1000 MG TABLET    Take 1 tablet (1,000 mg total) by mouth daily.   ZOLPIDEM (AMBIEN) 10 MG TABLET    Take 1/2 to 1 tablet (5-10 mg total) by mouth at bedtime as needed for sleep  Modified Medications   No medications on file  Discontinued Medications   No medications on file    Allergies: No Known Allergies  Past Medical History: Past Medical History:  Diagnosis Date   DVT (deep venous thrombosis) (HCC)    after half marathon    Dyslipidemia    GERD (gastroesophageal reflux disease)    Gilbert's disease    HH (hiatus hernia)    HIV positive ([place]) 92   Hyperlipidemia    Renal stone     Social History: Social History   Socioeconomic History   Marital status: Married    Spouse name: Not on file   Number of children: Not on file   Years of education: Not on file   Highest education level: Not on file  Occupational History   Occupation: Licensed conveyancer    Employer: [employer]  Tobacco Use   Smoking status: Never   Smokeless tobacco: Never  Substance and Sexual Activity   Alcohol use: No   Drug use: No   Sexual activity: Yes  Other Topics Concern   Not on file  Social  History Narrative   Not on file   Social Determinants of Health   Financial Resource Strain: Not on file  Food Insecurity: Not on file  Transportation Needs: Not on file  Physical Activity: Not on file  Stress: Not on file  Social Connections: Not on file    Labs: Lab Results  Component Value Date   HIV1RNAQUANT <20 DETECTED (A) 03/01/2017   HIV1RNAQUANT <20 NOT DETECTED 06/27/2016   HIV1RNAQUANT <20 01/11/2016   HIV1RNAVL 40 08/20/2020   HIV1RNAVL <20 07/29/2019   HIV1RNAVL <20 01/28/2019    RPR and STI Lab Results  Component Value Date   LABRPR NON REAC 12/01/2014   LABRPR NON REAC 06/03/2014   LABRPR NON REAC 04/29/2013   LABRPR NON REAC 04/15/2012    No flowsheet  data found.  Hepatitis B No results found for: HEPBSAB, HEPBSAG, HEPBCAB Hepatitis C No results found for: HEPCAB, HCVRNAPCRQN Hepatitis A No results found for: HAV Lipids: Lab Results  Component Value Date   CHOL 137 08/20/2020   TRIG 119 08/20/2020   HDL 46 08/20/2020   CHOLHDL 3.0 08/20/2020   VLDL 44 (H) 06/27/2016   LDLCALC 70 08/20/2020    Assessment: I spoke with Darryl Black today regarding their specialty medication, Biktarvy. Patient takes it every day without any issues or missed doses. No problems with adverse effects or tolerability. No problems getting [facility]. Updated/reviewed medication list - no drug interactions. All questions answered. Will follow up in 1 year.  Plan: - Continue Biktarvy - Follow up in 1 year   I discussed the assessment and treatment plan with the patient. The patient was provided an opportunity to ask questions and all were answered. The patient agreed with the plan and demonstrated an understanding of the instructions.   The patient was advised to call back or seek an in-person evaluation if the symptoms worsen or if the condition fails to improve as anticipated.  I provided 8 minutes of non-face-to-face time during this encounter.  Genell Thede L. Ngai Parcell, PharmD, BCIDP, AAHIVP, CPP Clinical Pharmacist Practitioner Infectious Diseases [place] 10/27/2020, 2:10 PM

## 2020-11-03 ENCOUNTER — Other Ambulatory Visit (HOSPITAL_COMMUNITY): Payer: Self-pay

## 2020-11-03 ENCOUNTER — Encounter: Payer: Self-pay | Admitting: Pharmacist

## 2020-11-03 ENCOUNTER — Other Ambulatory Visit (HOSPITAL_BASED_OUTPATIENT_CLINIC_OR_DEPARTMENT_OTHER): Payer: Self-pay

## 2020-11-05 ENCOUNTER — Ambulatory Visit (INDEPENDENT_AMBULATORY_CARE_PROVIDER_SITE_OTHER): Payer: No Typology Code available for payment source

## 2020-11-05 ENCOUNTER — Other Ambulatory Visit: Payer: Self-pay

## 2020-11-05 DIAGNOSIS — Z23 Encounter for immunization: Secondary | ICD-10-CM | POA: Diagnosis not present

## 2020-11-05 NOTE — Progress Notes (Signed)
    [facility]  Name:  Darryl Black    MRN: [id number] DOB: 1965/11/28   11/05/2020  Mr. Darryl Black was observed post JYNNEOS immunization for 15 minutes without incident. He was provided with Vaccine Information Sheet and instruction to access the V-Safe system.   Mr. Darryl Black was instructed to call 911 with any severe reactions post vaccine: Difficulty breathing  Swelling of face and throat  A fast heartbeat  A bad rash all over body  Dizziness and weakness     Margarette Vannatter T Brooks Sailors

## 2020-11-11 ENCOUNTER — Other Ambulatory Visit (HOSPITAL_COMMUNITY): Payer: Self-pay

## 2020-11-23 ENCOUNTER — Other Ambulatory Visit: Payer: Self-pay | Admitting: Family Medicine

## 2020-11-23 ENCOUNTER — Other Ambulatory Visit (HOSPITAL_BASED_OUTPATIENT_CLINIC_OR_DEPARTMENT_OTHER): Payer: Self-pay

## 2020-11-23 DIAGNOSIS — Z8619 Personal history of other infectious and parasitic diseases: Secondary | ICD-10-CM

## 2020-11-23 MED ORDER — VALACYCLOVIR HCL 1 G PO TABS
1000.0000 mg | ORAL_TABLET | Freq: Every day | ORAL | 1 refills | Status: DC
  Filled 2020-11-23: qty 30, 30d supply, fill #0
  Filled 2021-01-31: qty 30, 30d supply, fill #1

## 2020-11-23 NOTE — Telephone Encounter (Signed)
[facility] is requesting to fill pt  valtrex. Please advise [facility]

## 2020-11-24 ENCOUNTER — Other Ambulatory Visit (HOSPITAL_COMMUNITY): Payer: Self-pay

## 2020-11-29 ENCOUNTER — Other Ambulatory Visit (HOSPITAL_COMMUNITY): Payer: Self-pay

## 2020-12-01 ENCOUNTER — Other Ambulatory Visit (HOSPITAL_COMMUNITY): Payer: Self-pay

## 2020-12-09 ENCOUNTER — Ambulatory Visit: Payer: No Typology Code available for payment source | Attending: Internal Medicine

## 2020-12-09 DIAGNOSIS — Z23 Encounter for immunization: Secondary | ICD-10-CM

## 2020-12-09 NOTE — Progress Notes (Signed)
   Covid-19 Vaccination Clinic  Name:  Darryl Black    MRN: [id number] DOB: [birth year]  12/09/2020  Mr. Darryl Black was observed post Covid-19 immunization for 15 minutes without incident. He was provided with Vaccine Information Sheet and instruction to access the V-Safe system.   Mr. Darryl Black was instructed to call 911 with any severe reactions post vaccine: Difficulty breathing  Swelling of face and throat  A fast heartbeat  A bad rash all over body  Dizziness and weakness

## 2020-12-17 ENCOUNTER — Ambulatory Visit: Payer: No Typology Code available for payment source

## 2020-12-21 ENCOUNTER — Other Ambulatory Visit (HOSPITAL_COMMUNITY): Payer: Self-pay

## 2020-12-24 ENCOUNTER — Other Ambulatory Visit (HOSPITAL_COMMUNITY): Payer: Self-pay

## 2020-12-30 ENCOUNTER — Encounter: Payer: Self-pay | Admitting: Family Medicine

## 2021-01-18 ENCOUNTER — Other Ambulatory Visit (HOSPITAL_BASED_OUTPATIENT_CLINIC_OR_DEPARTMENT_OTHER): Payer: Self-pay

## 2021-01-18 ENCOUNTER — Other Ambulatory Visit (HOSPITAL_COMMUNITY): Payer: Self-pay

## 2021-01-19 ENCOUNTER — Other Ambulatory Visit (HOSPITAL_BASED_OUTPATIENT_CLINIC_OR_DEPARTMENT_OTHER): Payer: Self-pay

## 2021-01-26 ENCOUNTER — Other Ambulatory Visit (HOSPITAL_COMMUNITY): Payer: Self-pay

## 2021-01-31 ENCOUNTER — Other Ambulatory Visit (HOSPITAL_BASED_OUTPATIENT_CLINIC_OR_DEPARTMENT_OTHER): Payer: Self-pay

## 2021-02-02 ENCOUNTER — Other Ambulatory Visit (HOSPITAL_BASED_OUTPATIENT_CLINIC_OR_DEPARTMENT_OTHER): Payer: Self-pay

## 2021-02-15 ENCOUNTER — Other Ambulatory Visit (HOSPITAL_COMMUNITY): Payer: Self-pay

## 2021-02-16 ENCOUNTER — Other Ambulatory Visit (HOSPITAL_COMMUNITY): Payer: Self-pay

## 2021-02-17 ENCOUNTER — Encounter: Payer: No Typology Code available for payment source | Admitting: Family Medicine

## 2021-02-17 ENCOUNTER — Other Ambulatory Visit (HOSPITAL_COMMUNITY): Payer: Self-pay

## 2021-02-23 ENCOUNTER — Other Ambulatory Visit (HOSPITAL_COMMUNITY): Payer: Self-pay

## 2021-03-02 ENCOUNTER — Encounter: Payer: No Typology Code available for payment source | Admitting: Family Medicine

## 2021-03-21 ENCOUNTER — Other Ambulatory Visit (HOSPITAL_COMMUNITY): Payer: Self-pay

## 2021-03-24 ENCOUNTER — Encounter: Payer: Self-pay | Admitting: Cardiology

## 2021-03-24 ENCOUNTER — Ambulatory Visit (INDEPENDENT_AMBULATORY_CARE_PROVIDER_SITE_OTHER): Payer: No Typology Code available for payment source | Admitting: Cardiology

## 2021-03-24 ENCOUNTER — Other Ambulatory Visit: Payer: Self-pay

## 2021-03-24 VITALS — BP 134/90 | HR 66 | Ht 70.0 in | Wt 187.0 lb

## 2021-03-24 DIAGNOSIS — I10 Essential (primary) hypertension: Secondary | ICD-10-CM | POA: Insufficient documentation

## 2021-03-24 DIAGNOSIS — B20 Human immunodeficiency virus [HIV] disease: Secondary | ICD-10-CM

## 2021-03-24 DIAGNOSIS — I700 Atherosclerosis of aorta: Secondary | ICD-10-CM

## 2021-03-24 DIAGNOSIS — N1831 Chronic kidney disease, stage 3a: Secondary | ICD-10-CM

## 2021-03-24 DIAGNOSIS — R079 Chest pain, unspecified: Secondary | ICD-10-CM | POA: Diagnosis not present

## 2021-03-24 NOTE — Patient Instructions (Signed)
Medication Instructions:  The current medical regimen is effective;  continue present plan and medications.  *If you need a refill on your cardiac medications before your next appointment, please call your pharmacy*  Follow-Up: At [facility], you and your health needs are our priority.  As part of our continuing mission to provide you with exceptional heart care, we have created designated Provider Care Teams.  These Care Teams include your primary Cardiologist (physician) and Advanced Practice Providers (APPs -  Physician Assistants and Nurse Practitioners) who all work together to provide you with the care you need, when you need it.  We recommend signing up for the patient portal called "MyChart".  Sign up information is provided on this After Visit Summary.  MyChart is used to connect with patients for Virtual Visits (Telemedicine).  Patients are able to view lab/test results, encounter notes, upcoming appointments, etc.  Non-urgent messages can be sent to your provider as well.   To learn more about what you can do with MyChart, go to [email or web address].    Your next appointment:   1 year(s)  The format for your next appointment:   In Person  Provider:   Mark Skains, MD   Thank you for choosing [facility]!!    

## 2021-03-24 NOTE — Assessment & Plan Note (Signed)
On low-dose Crestor.  Continue.

## 2021-03-24 NOTE — Assessment & Plan Note (Signed)
Avoided contrasted CT scan because of this.  Avoid NSAIDs.  Overall stable.

## 2021-03-24 NOTE — Assessment & Plan Note (Signed)
Previously had stopped his losartan 50 mg at prior clinic visit because of blood pressure 98/66.  His blood pressure currently is 134/90.  At home he is usually seeing in the 130s over 80s.  Obviously if this continues, it may be a good idea to resume a lower dose of the losartan perhaps 25 mg.

## 2021-03-24 NOTE — Assessment & Plan Note (Signed)
Previously tried a cardiac CT scan however his creatinine was 1.9 and we decided not to proceed.  If symptoms become more worrisome, we can always proceed with stress test here in the office.  He will let us know.

## 2021-03-24 NOTE — Assessment & Plan Note (Signed)
Currently on Crestor 5 mg as well as phenol fibrate 160.  His LDL was 70, triglycerides 119.  He is also on Vascepa 2 g 2 times a day.  Excellent.  Doing well.

## 2021-03-24 NOTE — Assessment & Plan Note (Signed)
Stable on [employer]

## 2021-03-24 NOTE — Progress Notes (Signed)
Cardiology Office Note:    Date:  03/24/2021   ID:  Darryl Black, DOB 1966-01-24, MRN [id number]  PCP:  Denita Lung, MD  [facility] Cardiologist:  Candee Furbish, MD  [facility] Electrophysiologist:  None   Referring MD: Denita Lung, MD    History of Present Illness:    Darryl Black is a 56 y.o. male here for the follow-up of hypertension, hyperlipidemia, and aortic atherosclerosis.  Back in February 2021 chest discomfort was felt resting heart rate went up.  We ordered a coronary CT scan chronic kidney disease stage III noted with creatinine ranging from 1.4-1.9.  Therefore, the CT was not performed.  At his last appointment he was doing quite well without any chest discomfort.  No significant issues.    Today: Overall, he appears well. At home, his blood pressure has been stable and averaging 135/86.  Occasionally he has a mild chest discomfort, but he attributes this more to his esophageal stricture. Generally he denies any other cardiovascular symptoms.  He denies any palpitations, or shortness of breath. No lightheadedness, headaches, syncope, orthopnea, PND, lower extremity edema or exertional symptoms.   Past Medical History:  Diagnosis Date   DVT (deep venous thrombosis) (HCC)    after half marathon    Dyslipidemia    GERD (gastroesophageal reflux disease)    Gilbert's disease    HH (hiatus hernia)    HIV positive ([place]) 89   Hyperlipidemia    Renal stone     Past Surgical History:  Procedure Laterality Date   APPENDECTOMY     COLONOSCOPY     last 10 years + ago 2002 in epic- colon all normal   SKIN BIOPSY Right 03/14/2018   seborrheic keratosis inflamed   UPPER GASTROINTESTINAL ENDOSCOPY      Current Medications: Current Meds  Medication Sig   bictegravir-emtricitabine-tenofovir AF (BIKTARVY) 50-200-25 MG TABS tablet Take 1 tablet by mouth daily.   cholecalciferol (VITAMIN D) 1000 UNITS tablet Take 1,000 Units by mouth daily.   COVID-19  mRNA Vac-TriS, Pfizer, (PFIZER-BIONT COVID-19 VAC-TRIS) SUSP injection Inject into the muscle.   fenofibrate 160 MG tablet TAKE 1 TABLET BY MOUTH ONCE A DAY   icosapent Ethyl (VASCEPA) 1 g capsule Take 2 capsules (2 g total) by mouth 2 (two) times daily.   Multiple Vitamin (MULTIVITAMIN WITH MINERALS) TABS Take 1 tablet by mouth daily.   pantoprazole (PROTONIX) 40 MG tablet Take 1 tablet (40 mg total) by mouth daily.   rosuvastatin (CRESTOR) 5 MG tablet TAKE 1 TABLET BY MOUTH DAILY   valACYclovir (VALTREX) 1000 MG tablet Take 1 tablet (1,000 mg total) by mouth daily.   zolpidem (AMBIEN) 10 MG tablet Take 1/2 to 1 tablet (5-10 mg total) by mouth at bedtime as needed for sleep     Allergies:   Patient has no known allergies.   Social History   Socioeconomic History   Marital status: Married    Spouse name: Not on file   Number of children: Not on file   Years of education: Not on file   Highest education level: Not on file  Occupational History   Occupation: Mental Health Counselor    Employer: [employer]  Tobacco Use   Smoking status: Never   Smokeless tobacco: Never  Substance and Sexual Activity   Alcohol use: No   Drug use: No   Sexual activity: Yes  Other Topics Concern   Not on file  Social History Narrative  Not on file   Social Determinants of Health   Financial Resource Strain: Not on file  Food Insecurity: Not on file  Transportation Needs: Not on file  Physical Activity: Not on file  Stress: Not on file  Social Connections: Not on file     Family History: The patient's family history includes Diabetes in his father; Heart disease in his maternal grandfather and paternal grandfather; Pancreatic cancer in his mother. There is no history of Colon cancer, Colon polyps, Esophageal cancer, Rectal cancer, or Stomach cancer.  ROS:   Please see the history of present illness.    (+) Chest discomfort All other systems reviewed and are  negative.  EKGs/Labs/Other Studies Reviewed:    The following studies were reviewed today:  ECHO 06/19/19  1. Normal GLS -21.6. Left ventricular ejection fraction, by estimation,  is 55 to 60%. The left ventricle has normal function. The left ventricle  has no regional wall motion abnormalities. Left ventricular diastolic  parameters were normal.   2. Right ventricular systolic function is normal. The right ventricular  size is normal. There is normal pulmonary artery systolic pressure.   3. The mitral valve is normal in structure. Trivial mitral valve  regurgitation. No evidence of mitral stenosis.   4. The aortic valve is tricuspid. Aortic valve regurgitation is not  visualized. Mild to moderate aortic valve sclerosis/calcification is  present, without any evidence of aortic stenosis.   5. The inferior vena cava is normal in size with greater than 50%  respiratory variability, suggesting right atrial pressure of 3 mmHg.   Monitor 05/2019: Sinus bradycardia to sinus tachycardia. 2 episodes of SVT with ventricular rate 160 - 168 bpm. No pauses or other arrhythmias.   2 episodes of SVT with ventricular rate 160 - 168 bpm. The patient didn't document any symptoms. A referral to cardiology is recommended.  LEFT LE Venous Doppler 09/27/2016: Summary:  - No evidence of deep vein or superficial thrombosis involving the    left lower extremity and right common femoral vein.  - No evidence of Baker&'s cyst on the left.   EKG:   EKG is personally reviewed and interpreted. 03/24/2021: Sinus rhythm. Rate 66 bpm.  Recent Labs: 08/20/2020: ALT 25; BUN 14; Creatinine, Ser 1.67; Hemoglobin 16.9; Platelets 241; Potassium 4.3; Sodium 139   Recent Lipid Panel    Component Value Date/Time   CHOL 137 08/20/2020 1543   TRIG 119 08/20/2020 1543   HDL 46 08/20/2020 1543   CHOLHDL 3.0 08/20/2020 1543   CHOLHDL 5.8 (H) 03/01/2017 1150   VLDL 44 (H) 06/27/2016 0933   LDLCALC 70 08/20/2020 1543    LDLCALC 120 (H) 03/01/2017 1150     Risk Assessment/Calculations:       Physical Exam:    VS:  BP 134/90 (BP Location: Left Arm, Patient Position: Sitting, Cuff Size: Normal)    Pulse 66    Ht 5\' 10"  (1.778 m)    Wt 187 lb (84.8 kg)    SpO2 96%    BMI 26.83 kg/m     Wt Readings from Last 3 Encounters:  03/24/21 187 lb (84.8 kg)  08/20/20 182 lb 3.2 oz (82.6 kg)  02/12/20 183 lb 6.4 oz (83.2 kg)     GEN:  Well nourished, well developed in no acute distress HEENT: Normal NECK: No JVD; No carotid bruits LYMPHATICS: No lymphadenopathy CARDIAC: RRR, no murmurs, rubs, gallops RESPIRATORY:  Clear to auscultation without rales, wheezing or rhonchi  ABDOMEN: Soft, non-tender, non-distended MUSCULOSKELETAL:  No edema; No deformity  SKIN: Warm and dry NEUROLOGIC:  Alert and oriented x 3 PSYCHIATRIC:  Normal affect   ASSESSMENT:    1. Essential hypertension   2. Stage 3a chronic kidney disease ([place])   3. Aortic atherosclerosis (HCC)   4. Chest pain of uncertain etiology   5. HIV disease ([place])     PLAN:    In order of problems listed above:  Hyperlipidemia Currently on Crestor 5 mg as well as phenol fibrate 160.  His LDL was 70, triglycerides 119.  He is also on Vascepa 2 g 2 times a day.  Excellent.  Doing well.  Stage 3a chronic kidney disease Avoided contrasted CT scan because of this.  Avoid NSAIDs.  Overall stable.  Aortic atherosclerosis (HCC) On low-dose Crestor.  Continue.  Chest pain of uncertain etiology Previously tried a cardiac CT scan however his creatinine was 1.9 and we decided not to proceed.  If symptoms become more worrisome, we can always proceed with stress test here in the office.  He will let us know.  Essential hypertension Previously had stopped his losartan 50 mg at prior clinic visit because of blood pressure 98/66.  His blood pressure currently is 134/90.  At home he is usually seeing in the 130s over 80s.  Obviously if this continues, it may be  a good idea to resume a lower dose of the losartan perhaps 25 mg.  HIV disease Stable on Biktarvy     Follow-up: 1 year    Medication Adjustments/Labs and Tests Ordered: Current medicines are reviewed at length with the patient today.  Concerns regarding medicines are outlined above.   Orders Placed This Encounter  Procedures   EKG 12-Lead   No orders of the defined types were placed in this encounter.  Patient Instructions  Medication Instructions:  The current medical regimen is effective;  continue present plan and medications.  *If you need a refill on your cardiac medications before your next appointment, please call your pharmacy*  Follow-Up: At [facility], you and your health needs are our priority.  As part of our continuing mission to provide you with exceptional heart care, we have created designated Provider Care Teams.  These Care Teams include your primary Cardiologist (physician) and Advanced Practice Providers (APPs -  Physician Assistants and Nurse Practitioners) who all work together to provide you with the care you need, when you need it.  We recommend signing up for the patient portal called "MyChart".  Sign up information is provided on this After Visit Summary.  MyChart is used to connect with patients for Virtual Visits (Telemedicine).  Patients are able to view lab/test results, encounter notes, upcoming appointments, etc.  Non-urgent messages can be sent to your provider as well.   To learn more about what you can do with MyChart, go to [email or web address].    Your next appointment:   1 year(s)  The format for your next appointment:   In Person  Provider:   Candee Furbish, MD     Thank you for choosing [facility]!!      I,Mathew Stumpf,acting as a scribe for Candee Furbish, MD.,have documented all relevant documentation on the behalf of Candee Furbish, MD,as directed by  Candee Furbish, MD while in the presence of Candee Furbish, MD.  I, Candee Furbish, MD, have reviewed all documentation for this visit. The documentation on 03/24/21 for the exam, diagnosis, procedures, and orders are all accurate and complete.   Signed, Candee Furbish,  MD  03/24/2021 10:33 AM    [facility]

## 2021-03-28 ENCOUNTER — Other Ambulatory Visit (HOSPITAL_COMMUNITY): Payer: Self-pay

## 2021-04-19 ENCOUNTER — Other Ambulatory Visit (HOSPITAL_COMMUNITY): Payer: Self-pay

## 2021-04-19 ENCOUNTER — Telehealth: Payer: Self-pay | Admitting: Family Medicine

## 2021-04-19 DIAGNOSIS — N1831 Chronic kidney disease, stage 3a: Secondary | ICD-10-CM

## 2021-04-19 DIAGNOSIS — E785 Hyperlipidemia, unspecified: Secondary | ICD-10-CM

## 2021-04-19 DIAGNOSIS — B20 Human immunodeficiency virus [HIV] disease: Secondary | ICD-10-CM

## 2021-04-19 NOTE — Telephone Encounter (Signed)
Pt wants to come in next week for labs. He will need orders placed

## 2021-04-22 ENCOUNTER — Other Ambulatory Visit (HOSPITAL_COMMUNITY): Payer: Self-pay

## 2021-04-26 ENCOUNTER — Other Ambulatory Visit: Payer: No Typology Code available for payment source

## 2021-04-26 DIAGNOSIS — N1831 Chronic kidney disease, stage 3a: Secondary | ICD-10-CM

## 2021-04-26 DIAGNOSIS — E785 Hyperlipidemia, unspecified: Secondary | ICD-10-CM

## 2021-04-26 DIAGNOSIS — B20 Human immunodeficiency virus [HIV] disease: Secondary | ICD-10-CM

## 2021-04-26 LAB — LIPID PANEL

## 2021-04-27 ENCOUNTER — Other Ambulatory Visit (HOSPITAL_BASED_OUTPATIENT_CLINIC_OR_DEPARTMENT_OTHER): Payer: Self-pay

## 2021-04-27 LAB — COMPREHENSIVE METABOLIC PANEL
ALT: 43 IU/L (ref 0–44)
AST: 23 IU/L (ref 0–40)
Albumin/Globulin Ratio: 2 (ref 1.2–2.2)
Albumin: 4.7 g/dL (ref 3.8–4.9)
Alkaline Phosphatase: 57 IU/L (ref 44–121)
BUN/Creatinine Ratio: 8 — ABNORMAL LOW (ref 9–20)
BUN: 14 mg/dL (ref 6–24)
Bilirubin Total: 0.8 mg/dL (ref 0.0–1.2)
CO2: 24 mmol/L (ref 20–29)
Calcium: 9.8 mg/dL (ref 8.7–10.2)
Chloride: 100 mmol/L (ref 96–106)
Creatinine, Ser: 1.65 mg/dL — ABNORMAL HIGH (ref 0.76–1.27)
Globulin, Total: 2.4 g/dL (ref 1.5–4.5)
Glucose: 120 mg/dL — ABNORMAL HIGH (ref 70–99)
Potassium: 4.3 mmol/L (ref 3.5–5.2)
Sodium: 138 mmol/L (ref 134–144)
Total Protein: 7.1 g/dL (ref 6.0–8.5)
eGFR: 48 mL/min/{1.73_m2} — ABNORMAL LOW (ref >59)

## 2021-04-27 LAB — T-HELPER CELLS (CD4) COUNT (NOT AT ARMC)
% CD 4 Pos. Lymph.: 33.4 % (ref 30.8–58.5)
Absolute CD 4 Helper: 935 /uL (ref 359–1519)
Basophils Absolute: 0.1 10*3/uL (ref 0.0–0.2)
Basos: 1 %
EOS (ABSOLUTE): 0.1 10*3/uL (ref 0.0–0.4)
Eos: 1 %
Hematocrit: 48.8 % (ref 37.5–51.0)
Hemoglobin: 16.9 g/dL (ref 13.0–17.7)
Immature Grans (Abs): 0 10*3/uL (ref 0.0–0.1)
Immature Granulocytes: 0 %
Lymphocytes Absolute: 2.8 10*3/uL (ref 0.7–3.1)
Lymphs: 41 %
MCH: 33.3 pg — ABNORMAL HIGH (ref 26.6–33.0)
MCHC: 34.6 g/dL (ref 31.5–35.7)
MCV: 96 fL (ref 79–97)
Monocytes Absolute: 0.8 10*3/uL (ref 0.1–0.9)
Monocytes: 12 %
Neutrophils Absolute: 3.2 10*3/uL (ref 1.4–7.0)
Neutrophils: 45 %
Platelets: 213 10*3/uL (ref 150–450)
RBC: 5.08 x10E6/uL (ref 4.14–5.80)
RDW: 12.1 % (ref 11.6–15.4)
WBC: 7 10*3/uL (ref 3.4–10.8)

## 2021-04-27 LAB — HIV-1 RNA QUANT-NO REFLEX-BLD: HIV-1 RNA Viral Load: <20 copies/mL

## 2021-04-27 LAB — LIPID PANEL
Chol/HDL Ratio: 3.6 ratio (ref 0.0–5.0)
Cholesterol, Total: 149 mg/dL (ref 100–199)
HDL: 41 mg/dL (ref >39)
LDL Chol Calc (NIH): 85 mg/dL (ref 0–99)
Triglycerides: 129 mg/dL (ref 0–149)
VLDL Cholesterol Cal: 23 mg/dL (ref 5–40)

## 2021-05-03 ENCOUNTER — Other Ambulatory Visit: Payer: Self-pay | Admitting: Family Medicine

## 2021-05-03 ENCOUNTER — Other Ambulatory Visit (HOSPITAL_BASED_OUTPATIENT_CLINIC_OR_DEPARTMENT_OTHER): Payer: Self-pay

## 2021-05-03 DIAGNOSIS — F5102 Adjustment insomnia: Secondary | ICD-10-CM

## 2021-05-03 MED ORDER — ZOLPIDEM TARTRATE 10 MG PO TABS
5.0000 mg | ORAL_TABLET | Freq: Every evening | ORAL | 0 refills | Status: DC | PRN
  Filled 2021-05-03: qty 30, 30d supply, fill #0

## 2021-05-03 NOTE — Telephone Encounter (Signed)
[place] and pt is requesting to fill pt ambien. Please advise . [place] ?

## 2021-05-03 NOTE — Telephone Encounter (Signed)
In looking through chart for refill request, I see that Dr. Redmond School ordered a Tdap at his CPE in 07/2020. You contacted him that he was in need of Tdap, and to schedule NV.  Did you not give it when ordered in June?  You may want to go back and look, to see if it was given but in error not documented? ? ?He will be due for another CPE in June, and doesn't have any f/u scheduled.  Please schedule with JCL  ? ?Ambien last filled #30 08/20/20.  Authorized RF today. ?

## 2021-05-03 NOTE — Telephone Encounter (Signed)
I have check both chart and imm pad. Pt did not get the Tdap vaccine and is due. ? ?.kh ?

## 2021-05-10 ENCOUNTER — Other Ambulatory Visit: Payer: No Typology Code available for payment source

## 2021-05-11 ENCOUNTER — Other Ambulatory Visit (INDEPENDENT_AMBULATORY_CARE_PROVIDER_SITE_OTHER): Payer: No Typology Code available for payment source

## 2021-05-11 ENCOUNTER — Other Ambulatory Visit: Payer: Self-pay

## 2021-05-11 DIAGNOSIS — Z23 Encounter for immunization: Secondary | ICD-10-CM

## 2021-05-13 ENCOUNTER — Other Ambulatory Visit (HOSPITAL_COMMUNITY): Payer: Self-pay

## 2021-05-17 ENCOUNTER — Other Ambulatory Visit (HOSPITAL_COMMUNITY): Payer: Self-pay

## 2021-06-10 ENCOUNTER — Other Ambulatory Visit (HOSPITAL_COMMUNITY): Payer: Self-pay

## 2021-06-13 ENCOUNTER — Other Ambulatory Visit (HOSPITAL_COMMUNITY): Payer: Self-pay

## 2021-07-05 ENCOUNTER — Other Ambulatory Visit (HOSPITAL_COMMUNITY): Payer: Self-pay

## 2021-07-07 ENCOUNTER — Other Ambulatory Visit (HOSPITAL_COMMUNITY): Payer: Self-pay

## 2021-07-08 ENCOUNTER — Other Ambulatory Visit (HOSPITAL_COMMUNITY): Payer: Self-pay

## 2021-07-12 ENCOUNTER — Other Ambulatory Visit: Payer: No Typology Code available for payment source

## 2021-07-18 ENCOUNTER — Other Ambulatory Visit: Payer: Self-pay | Admitting: Family Medicine

## 2021-07-18 DIAGNOSIS — Z8619 Personal history of other infectious and parasitic diseases: Secondary | ICD-10-CM

## 2021-07-19 ENCOUNTER — Other Ambulatory Visit (INDEPENDENT_AMBULATORY_CARE_PROVIDER_SITE_OTHER): Payer: No Typology Code available for payment source

## 2021-07-19 ENCOUNTER — Other Ambulatory Visit (HOSPITAL_BASED_OUTPATIENT_CLINIC_OR_DEPARTMENT_OTHER): Payer: Self-pay

## 2021-07-19 DIAGNOSIS — Z23 Encounter for immunization: Secondary | ICD-10-CM

## 2021-07-19 MED ORDER — VALACYCLOVIR HCL 1 G PO TABS
1000.0000 mg | ORAL_TABLET | Freq: Every day | ORAL | 1 refills | Status: DC
  Filled 2021-07-19: qty 30, 30d supply, fill #0
  Filled 2021-08-17: qty 30, 30d supply, fill #1

## 2021-07-19 NOTE — Telephone Encounter (Signed)
[facility] is requesting to fill pt valtrex . Please advise [facility]

## 2021-08-02 ENCOUNTER — Other Ambulatory Visit (HOSPITAL_COMMUNITY): Payer: Self-pay

## 2021-08-03 ENCOUNTER — Other Ambulatory Visit (HOSPITAL_COMMUNITY): Payer: Self-pay

## 2021-08-15 ENCOUNTER — Other Ambulatory Visit (HOSPITAL_BASED_OUTPATIENT_CLINIC_OR_DEPARTMENT_OTHER): Payer: Self-pay

## 2021-08-17 ENCOUNTER — Other Ambulatory Visit (HOSPITAL_BASED_OUTPATIENT_CLINIC_OR_DEPARTMENT_OTHER): Payer: Self-pay

## 2021-08-22 ENCOUNTER — Other Ambulatory Visit (HOSPITAL_COMMUNITY): Payer: Self-pay

## 2021-08-22 ENCOUNTER — Other Ambulatory Visit: Payer: Self-pay | Admitting: Pharmacist

## 2021-08-22 DIAGNOSIS — B20 Human immunodeficiency virus [HIV] disease: Secondary | ICD-10-CM

## 2021-08-23 ENCOUNTER — Other Ambulatory Visit: Payer: Self-pay | Admitting: Family Medicine

## 2021-08-23 ENCOUNTER — Other Ambulatory Visit (HOSPITAL_COMMUNITY): Payer: Self-pay

## 2021-08-23 DIAGNOSIS — B20 Human immunodeficiency virus [HIV] disease: Secondary | ICD-10-CM

## 2021-08-24 ENCOUNTER — Other Ambulatory Visit: Payer: Self-pay | Admitting: Pharmacist

## 2021-08-24 ENCOUNTER — Other Ambulatory Visit: Payer: Self-pay | Admitting: Family Medicine

## 2021-08-24 ENCOUNTER — Other Ambulatory Visit (HOSPITAL_COMMUNITY): Payer: Self-pay

## 2021-08-24 DIAGNOSIS — B20 Human immunodeficiency virus [HIV] disease: Secondary | ICD-10-CM

## 2021-08-24 MED ORDER — BIKTARVY 50-200-25 MG PO TABS
1.0000 | ORAL_TABLET | Freq: Every day | ORAL | 3 refills | Status: DC
  Filled 2021-08-24: qty 90, 90d supply, fill #0

## 2021-08-24 MED ORDER — BIKTARVY 50-200-25 MG PO TABS
1.0000 | ORAL_TABLET | Freq: Every day | ORAL | 3 refills | Status: DC
  Filled 2021-08-24: qty 30, 30d supply, fill #0
  Filled 2021-09-16: qty 30, 30d supply, fill #1
  Filled 2021-10-11: qty 30, 30d supply, fill #2

## 2021-08-24 NOTE — Telephone Encounter (Signed)
Please resend in please. Thanks [employer]

## 2021-08-25 ENCOUNTER — Other Ambulatory Visit (HOSPITAL_COMMUNITY): Payer: Self-pay

## 2021-09-16 ENCOUNTER — Other Ambulatory Visit (HOSPITAL_COMMUNITY): Payer: Self-pay

## 2021-09-21 ENCOUNTER — Other Ambulatory Visit (HOSPITAL_COMMUNITY): Payer: Self-pay

## 2021-09-21 ENCOUNTER — Other Ambulatory Visit: Payer: Self-pay | Admitting: Family Medicine

## 2021-09-21 DIAGNOSIS — Z8619 Personal history of other infectious and parasitic diseases: Secondary | ICD-10-CM

## 2021-09-22 NOTE — Telephone Encounter (Signed)
Is this okay to refill? 

## 2021-09-27 ENCOUNTER — Other Ambulatory Visit: Payer: Self-pay

## 2021-09-27 ENCOUNTER — Ambulatory Visit (INDEPENDENT_AMBULATORY_CARE_PROVIDER_SITE_OTHER): Payer: No Typology Code available for payment source | Admitting: Pharmacist

## 2021-09-27 DIAGNOSIS — B20 Human immunodeficiency virus [HIV] disease: Secondary | ICD-10-CM

## 2021-09-27 NOTE — Progress Notes (Signed)
Virtual Visit via Telephone Note  I connected with Darryl Black on 09/27/21 at 11:15 AM EDT by telephone and verified that I am speaking with the correct person using two identifiers.  Location: Patient: Work Provider: [facility]   I discussed the limitations, risks, security and privacy concerns of performing an evaluation and management service by telephone and the availability of in person appointments. I also discussed with the patient that there may be a patient responsible charge related to this service. The patient expressed understanding and agreed to proceed.  HPI: Darryl Black is a 56 y.o. male who presents for follow-up of their long-term specialty medication, Biktarvy.  Patient Active Problem List   Diagnosis Date Noted   Chest pain of uncertain etiology 47/42/5956   Essential hypertension 03/24/2021   Aortic atherosclerosis ([place]) 04/23/2019   Type 2 diabetes mellitus in remission ([place]) 01/31/2019   Stage 3a chronic kidney disease ([place]) 01/30/2019   History of esophageal stricture 06/15/2015   History of herpes genitalis 12/01/2014   H/O herpes labialis 12/01/2014   Gilbert's disease 06/03/2014   HH (hiatus hernia) 04/15/2012   History of renal stone 04/15/2012   Hyperlipidemia 04/11/2011   HIV disease ([place]) 04/11/2011    Patient's Medications  New Prescriptions   No medications on file  Previous Medications   BICTEGRAVIR-EMTRICITABINE-TENOFOVIR AF (BIKTARVY) 50-200-25 MG TABS TABLET    Take 1 tablet by mouth daily.   CHOLECALCIFEROL (VITAMIN D) 1000 UNITS TABLET    Take 1,000 Units by mouth daily.   COVID-19 MRNA VAC-TRIS, PFIZER, (PFIZER-BIONT COVID-19 VAC-TRIS) SUSP INJECTION    Inject into the muscle.   FENOFIBRATE 160 MG TABLET    TAKE 1 TABLET BY MOUTH ONCE A DAY   ICOSAPENT ETHYL (VASCEPA) 1 G CAPSULE    Take 2 capsules (2 g total) by mouth 2 (two) times daily.   MULTIPLE VITAMIN (MULTIVITAMIN WITH MINERALS) TABS    Take 1 tablet by mouth daily.   PANTOPRAZOLE  (PROTONIX) 40 MG TABLET    Take 1 tablet (40 mg total) by mouth daily.   ROSUVASTATIN (CRESTOR) 5 MG TABLET    TAKE 1 TABLET BY MOUTH DAILY   VALACYCLOVIR (VALTREX) 1000 MG TABLET    Take 1 tablet (1,000 mg total) by mouth daily.   ZOLPIDEM (AMBIEN) 10 MG TABLET    Take 1/2 to 1 tablet (5-10 mg total) by mouth at bedtime as needed for sleep  Modified Medications   No medications on file  Discontinued Medications   No medications on file    Allergies: No Known Allergies  Past Medical History: Past Medical History:  Diagnosis Date   DVT (deep venous thrombosis) (HCC)    after half marathon    Dyslipidemia    GERD (gastroesophageal reflux disease)    Gilbert's disease    HH (hiatus hernia)    HIV positive ([place]) 89   Hyperlipidemia    Renal stone     Social History: Social History   Socioeconomic History   Marital status: Married    Spouse name: Not on file   Number of children: Not on file   Years of education: Not on file   Highest education level: Not on file  Occupational History   Occupation: Licensed conveyancer    Employer: [employer]  Tobacco Use   Smoking status: Never   Smokeless tobacco: Never  Substance and Sexual Activity   Alcohol use: No   Drug use: No   Sexual activity: Yes  Other Topics Concern   Not on file  Social History Narrative   Not on file   Social Determinants of Health   Financial Resource Strain: Not on file  Food Insecurity: Not on file  Transportation Needs: Not on file  Physical Activity: Not on file  Stress: Not on file  Social Connections: Not on file    Labs: Lab Results  Component Value Date   HIV1RNAQUANT <20 DETECTED (A) 03/01/2017   HIV1RNAQUANT <20 NOT DETECTED 06/27/2016   HIV1RNAQUANT <20 01/11/2016   HIV1RNAVL <20 04/26/2021   HIV1RNAVL 40 08/20/2020   HIV1RNAVL <20 07/29/2019    RPR and STI Lab Results  Component Value Date   LABRPR NON REAC 12/01/2014   LABRPR NON REAC 06/03/2014    LABRPR NON REAC 04/29/2013   LABRPR NON REAC 04/15/2012        No data to display          Hepatitis B No results found for: "HEPBSAB", "HEPBSAG", "HEPBCAB" Hepatitis C No results found for: "HEPCAB", "HCVRNAPCRQN" Hepatitis A No results found for: "HAV" Lipids: Lab Results  Component Value Date   CHOL 149 04/26/2021   TRIG 129 04/26/2021   HDL 41 04/26/2021   CHOLHDL 3.6 04/26/2021   VLDL 44 (H) 06/27/2016   LDLCALC 85 04/26/2021    Assessment: I spoke with Darryl Black today regarding their specialty medication, Biktarvy. Patient takes it every day without any issues or missed doses. No problems with adverse effects or tolerability. No problems getting it from [facility]. Updated/reviewed medication list - no drug interactions. All questions answered. Will follow up in 1 year.  Plan: - Continue Biktarvy - Follow up in 1 year   I discussed the assessment and treatment plan with the patient. The patient was provided an opportunity to ask questions and all were answered. The patient agreed with the plan and demonstrated an understanding of the instructions.   The patient was advised to call back or seek an in-person evaluation if the symptoms worsen or if the condition fails to improve as anticipated.  I provided 5 minutes of non-face-to-face time during this encounter.  Aisling Emigh L. Eber Hong, PharmD, BCIDP, AAHIVP, CPP Clinical Pharmacist Practitioner Infectious Diseases [facility] 09/27/2021, 11:09 AM

## 2021-10-02 MED ORDER — VALACYCLOVIR HCL 1 G PO TABS
1000.0000 mg | ORAL_TABLET | Freq: Every day | ORAL | 1 refills | Status: DC
  Filled 2021-10-02: qty 30, 30d supply, fill #0

## 2021-10-03 ENCOUNTER — Other Ambulatory Visit (HOSPITAL_BASED_OUTPATIENT_CLINIC_OR_DEPARTMENT_OTHER): Payer: Self-pay

## 2021-10-11 ENCOUNTER — Other Ambulatory Visit (HOSPITAL_COMMUNITY): Payer: Self-pay

## 2021-10-20 ENCOUNTER — Encounter: Payer: Self-pay | Admitting: Family Medicine

## 2021-10-20 ENCOUNTER — Other Ambulatory Visit (HOSPITAL_COMMUNITY): Payer: Self-pay

## 2021-10-20 ENCOUNTER — Ambulatory Visit (INDEPENDENT_AMBULATORY_CARE_PROVIDER_SITE_OTHER): Payer: No Typology Code available for payment source | Admitting: Family Medicine

## 2021-10-20 ENCOUNTER — Other Ambulatory Visit (HOSPITAL_BASED_OUTPATIENT_CLINIC_OR_DEPARTMENT_OTHER): Payer: Self-pay

## 2021-10-20 VITALS — BP 122/86 | HR 66 | Temp 97.7°F | Ht 69.0 in | Wt 184.8 lb

## 2021-10-20 DIAGNOSIS — Z8619 Personal history of other infectious and parasitic diseases: Secondary | ICD-10-CM

## 2021-10-20 DIAGNOSIS — K449 Diaphragmatic hernia without obstruction or gangrene: Secondary | ICD-10-CM

## 2021-10-20 DIAGNOSIS — I700 Atherosclerosis of aorta: Secondary | ICD-10-CM

## 2021-10-20 DIAGNOSIS — I10 Essential (primary) hypertension: Secondary | ICD-10-CM

## 2021-10-20 DIAGNOSIS — E785 Hyperlipidemia, unspecified: Secondary | ICD-10-CM

## 2021-10-20 DIAGNOSIS — E119 Type 2 diabetes mellitus without complications: Secondary | ICD-10-CM

## 2021-10-20 DIAGNOSIS — F5102 Adjustment insomnia: Secondary | ICD-10-CM

## 2021-10-20 DIAGNOSIS — E781 Pure hyperglyceridemia: Secondary | ICD-10-CM

## 2021-10-20 DIAGNOSIS — N1831 Chronic kidney disease, stage 3a: Secondary | ICD-10-CM | POA: Diagnosis not present

## 2021-10-20 DIAGNOSIS — Z8719 Personal history of other diseases of the digestive system: Secondary | ICD-10-CM

## 2021-10-20 DIAGNOSIS — D2230 Melanocytic nevi of unspecified part of face: Secondary | ICD-10-CM

## 2021-10-20 DIAGNOSIS — B20 Human immunodeficiency virus [HIV] disease: Secondary | ICD-10-CM

## 2021-10-20 LAB — POCT GLYCOSYLATED HEMOGLOBIN (HGB A1C): Hemoglobin A1C: 5.8 % — AB (ref 4.0–5.6)

## 2021-10-20 LAB — POCT UA - MICROALBUMIN
Albumin/Creatinine Ratio, Urine, POC: <9.2
Creatinine, POC: 54.3 mg/dL
Microalbumin Ur, POC: <5 mg/L

## 2021-10-20 MED ORDER — VALACYCLOVIR HCL 1 G PO TABS
1000.0000 mg | ORAL_TABLET | Freq: Every day | ORAL | 1 refills | Status: DC
  Filled 2021-10-20 – 2021-11-17 (×2): qty 30, 30d supply, fill #0
  Filled 2021-12-15: qty 30, 30d supply, fill #1

## 2021-10-20 MED ORDER — FENOFIBRATE 160 MG PO TABS
ORAL_TABLET | Freq: Every day | ORAL | 3 refills | Status: DC
  Filled 2021-10-20: qty 90, 90d supply, fill #0
  Filled 2022-01-25: qty 90, 90d supply, fill #1
  Filled 2022-04-20 (×2): qty 90, 90d supply, fill #2
  Filled 2022-07-20: qty 90, 90d supply, fill #3

## 2021-10-20 MED ORDER — ROSUVASTATIN CALCIUM 5 MG PO TABS
ORAL_TABLET | Freq: Every day | ORAL | 3 refills | Status: DC
  Filled 2021-10-20: qty 90, 90d supply, fill #0
  Filled 2022-01-25: qty 90, 90d supply, fill #1
  Filled 2022-05-04: qty 90, 90d supply, fill #2
  Filled 2022-07-27: qty 90, 90d supply, fill #3

## 2021-10-20 MED ORDER — ICOSAPENT ETHYL 1 G PO CAPS
2.0000 g | ORAL_CAPSULE | Freq: Two times a day (BID) | ORAL | 3 refills | Status: DC
  Filled 2021-10-20 – 2022-02-16 (×2): qty 360, 90d supply, fill #0
  Filled 2022-09-11: qty 360, 90d supply, fill #1

## 2021-10-20 MED ORDER — ZOLPIDEM TARTRATE 10 MG PO TABS
5.0000 mg | ORAL_TABLET | Freq: Every evening | ORAL | 0 refills | Status: DC | PRN
  Filled 2021-10-20: qty 30, 30d supply, fill #0

## 2021-10-20 MED ORDER — BIKTARVY 50-200-25 MG PO TABS
1.0000 | ORAL_TABLET | Freq: Every day | ORAL | 3 refills | Status: DC
  Filled 2021-10-20: qty 90, 90d supply, fill #0

## 2021-10-20 NOTE — Progress Notes (Signed)
Complete physical exam  Patient: Darryl Black   DOB: 1965-12-07   56 y.o. Male  MRN: [id number]  Subjective:    Chief Complaint  Patient presents with   Annual Exam    Fasting     Darryl Black is a 56 y.o. male who presents today for a complete physical exam. He reports consuming a general diet. Gym/ health club routine includes mod to heavy weightlifting and walking on track . He generally feels well. He reports sleeping fairly well.  Does have a previous history of diabetes but has been in remission for the last couple of years.  He has gained some weight recently due to work-related stress but seems to be handling this fairly well.  He does have a history of CKD but has been stable on this for the last several years.  Does have a lesion on his face and 1 on his left forearm that he would like me to look at.  Continues use Valtrex as needed for his herpes.  Continues on [place] without difficulty.  Does use Ambien on an as-needed basis.  He continues on Vascepa, fenofibrate and Crestor for his underlying aortic atherosclerosis.  He takes pantoprazole and does have a history of hiatus hernia as well as esophageal stricture..  He has a history of hypertension but the present time his blood pressure is normal.  Home life is stable.  Otherwise his family and social history as well as health maintenance and immunizations was reviewed.   Most recent fall risk assessment:    08/27/2019    1:26 PM  [place] in the past year? 0     Most recent depression screenings:    10/20/2021    2:56 PM 08/20/2020    2:47 PM  PHQ 2/9 Scores  PHQ - 2 Score 0 0      Patient Active Problem List   Diagnosis Date Noted   Essential hypertension 03/24/2021   Aortic atherosclerosis ([place]) 04/23/2019   Type 2 diabetes mellitus in remission ([place]) 01/31/2019   Stage 3a chronic kidney disease ([place]) 01/30/2019   History of esophageal stricture 06/15/2015   History of herpes genitalis 12/01/2014    H/O herpes labialis 12/01/2014   Gilbert's disease 06/03/2014   HH (hiatus hernia) 04/15/2012   History of renal stone 04/15/2012   Hyperlipidemia 04/11/2011   HIV disease ([place]) 04/11/2011   Past Medical History:  Diagnosis Date   DVT (deep venous thrombosis) (HCC)    after half marathon    Dyslipidemia    GERD (gastroesophageal reflux disease)    Gilbert's disease    HH (hiatus hernia)    HIV positive ([place]) 92   Hyperlipidemia    Renal stone    Past Surgical History:  Procedure Laterality Date   APPENDECTOMY     COLONOSCOPY     last 10 years + ago 2002 in epic- colon all normal   SKIN BIOPSY Right 03/14/2018   seborrheic keratosis inflamed   UPPER GASTROINTESTINAL ENDOSCOPY     Social History   Tobacco Use   Smoking status: Never   Smokeless tobacco: Never  Substance Use Topics   Alcohol use: No   Drug use: No   Family History  Problem Relation Age of Onset   Pancreatic cancer Mother    Diabetes Father    Heart disease Maternal Grandfather    Heart disease Paternal Grandfather    Colon cancer Neg Hx    Colon polyps Neg  Hx    Esophageal cancer Neg Hx    Rectal cancer Neg Hx    Stomach cancer Neg Hx    No Known Allergies    Patient Care Team: Denita Lung, MD as PCP - General (Family Medicine) Jerline Pain, MD as PCP - Cardiology (Cardiology)   Outpatient Medications Prior to Visit  Medication Sig Note   cholecalciferol (VITAMIN D) 1000 UNITS tablet Take 1,000 Units by mouth daily.    Multiple Vitamin (MULTIVITAMIN WITH MINERALS) TABS Take 1 tablet by mouth daily.    pantoprazole (PROTONIX) 40 MG tablet Take 1 tablet (40 mg total) by mouth daily.    [DISCONTINUED] bictegravir-emtricitabine-tenofovir AF (BIKTARVY) 50-200-25 MG TABS tablet Take 1 tablet by mouth daily.    [DISCONTINUED] fenofibrate 160 MG tablet TAKE 1 TABLET BY MOUTH ONCE A DAY    [DISCONTINUED] icosapent Ethyl (VASCEPA) 1 g capsule Take 2 capsules (2 g total) by mouth 2 (two)  times daily.    [DISCONTINUED] rosuvastatin (CRESTOR) 5 MG tablet TAKE 1 TABLET BY MOUTH DAILY    [DISCONTINUED] valACYclovir (VALTREX) 1000 MG tablet Take 1 tablet (1,000 mg total) by mouth daily.    [DISCONTINUED] zolpidem (AMBIEN) 10 MG tablet Take 1/2 to 1 tablet (5-10 mg total) by mouth at bedtime as needed for sleep 10/20/2021: Prn last dose was Monday of this week   COVID-19 mRNA Vac-TriS, Pfizer, (PFIZER-BIONT COVID-19 VAC-TRIS) SUSP injection Inject into the muscle. (Patient not taking: Reported on 10/20/2021)    No facility-administered medications prior to visit.    Review of Systems  All other systems reviewed and are negative.         Objective:     BP 122/86   Pulse 66   Temp 97.7 F (36.5 C)   Ht 5' 9"  (1.753 m)   Wt 184 lb 12.8 oz (83.8 kg)   SpO2 99%   BMI 27.29 kg/m  BP Readings from Last 3 Encounters:  10/20/21 122/86  03/24/21 134/90  08/20/20 110/68   Wt Readings from Last 3 Encounters:  10/20/21 184 lb 12.8 oz (83.8 kg)  03/24/21 187 lb (84.8 kg)  08/20/20 182 lb 3.2 oz (82.6 kg)      Physical Exam  Alert and in no distress.  1 cm round smooth pigmented lesions noted on the right cheek area.  He also has slightly erythematous dry scaly lesion present on his left forearm tympanic membranes and canals are normal. Pharyngeal area is normal. Neck is supple without adenopathy or thyromegaly. Cardiac exam shows a regular sinus rhythm without murmurs or gallops. Lungs are clear to auscultation.  Last CBC Lab Results  Component Value Date   WBC 7.0 04/26/2021   HGB 16.9 04/26/2021   HCT 48.8 04/26/2021   MCV 96 04/26/2021   MCH 33.3 (H) 04/26/2021   RDW 12.1 04/26/2021   PLT 213 17/49/4496   Last metabolic panel Lab Results  Component Value Date   GLUCOSE 120 (H) 04/26/2021   NA 138 04/26/2021   K 4.3 04/26/2021   CL 100 04/26/2021   CO2 24 04/26/2021   BUN 14 04/26/2021   CREATININE 1.65 (H) 04/26/2021   EGFR 48 (L) 04/26/2021   CALCIUM  9.8 04/26/2021   PROT 7.1 04/26/2021   ALBUMIN 4.7 04/26/2021   LABGLOB 2.4 04/26/2021   AGRATIO 2.0 04/26/2021   BILITOT 0.8 04/26/2021   ALKPHOS 57 04/26/2021   AST 23 04/26/2021   ALT 43 04/26/2021   Last lipids Lab Results  Component  Value Date   CHOL 149 04/26/2021   HDL 41 04/26/2021   LDLCALC 85 04/26/2021   TRIG 129 04/26/2021   CHOLHDL 3.6 04/26/2021        Assessment & Plan:    Aortic atherosclerosis ([place])  Essential hypertension - Plan: CBC with Differential/Platelet, Comprehensive metabolic panel  HH (hiatus hernia)  Type 2 diabetes mellitus in remission ([place])  Stage 3a chronic kidney disease ([place])  History of esophageal stricture  HIV disease ([place]) - Plan: CBC with Differential/Platelet, Comprehensive metabolic panel, HIV-1 RNA quant-no reflex-bld, T-helper cells (CD4) count (not at [facility]), bictegravir-emtricitabine-tenofovir AF (BIKTARVY) 50-200-25 MG TABS tablet  Hyperlipidemia, unspecified hyperlipidemia type - Plan: Lipid panel, icosapent Ethyl (VASCEPA) 1 g capsule, fenofibrate 160 MG tablet, rosuvastatin (CRESTOR) 5 MG tablet  Hypertriglyceridemia - Plan: icosapent Ethyl (VASCEPA) 1 g capsule, fenofibrate 160 MG tablet  History of herpes genitalis - Plan: valACYclovir (VALTREX) 1000 MG tablet  Transient insomnia of non-organic origin - Plan: zolpidem (AMBIEN) 10 MG tablet  Change in facial mole - Plan: Ambulatory referral to Dermatology  Immunization History  Administered Date(s) Administered   Influenza Split 12/11/2000, 03/22/2010   Influenza-Unspecified 12/06/2015, 11/15/2016, 11/23/2017, 12/17/2018, 11/30/2020   PFIZER Comirnaty(Gray Top)Covid-19 Tri-Sucrose Vaccine 10/05/2020   PFIZER(Purple Top)SARS-COV-2 Vaccination 02/17/2019, 03/10/2019, 12/02/2019   PPD Test 07/31/1994   Pfizer Covid-19 Vaccine Bivalent Booster 43yr & up 12/09/2020   Pneumococcal Conjugate-13 06/03/2014   Pneumococcal Polysaccharide-23 07/31/1994, 11/24/2004   Tdap  03/22/2010, 05/11/2021   Vaccinia,smallpox Monkeypox Vaccine Live,pf 11/05/2020, 12/26/2020   Zoster Recombinat (Shingrix) 05/11/2021, 07/19/2021    Health Maintenance  Topic Date Due   OPHTHALMOLOGY EXAM  Never done   URINE MICROALBUMIN  Never done   HEMOGLOBIN A1C  02/19/2021   INFLUENZA VACCINE  09/27/2021   FOOT EXAM  10/21/2022   COLONOSCOPY (Pts 45-458yrInsurance coverage will need to be confirmed)  02/08/2025   TETANUS/TDAP  05/12/2031   Hepatitis C Screening  Completed   HIV Screening  Completed   Zoster Vaccines- Shingrix  Completed   HPV VACCINES  Aged Out   COVID-19 Vaccine  Discontinued  Encouraged him to continue on his present medication.  I will refer to dermatology to evaluate the skin lesions.  Discussed the stress that he is under and he seems to be handling this fairly well.  Discussed health benefits of physical activity, and encouraged him to engage in regular exercise appropriate for his age and condition.  Problem List Items Addressed This Visit     HH (hiatus hernia) (Chronic)   Aortic atherosclerosis (HCC) - Primary   Relevant Medications   icosapent Ethyl (VASCEPA) 1 g capsule   fenofibrate 160 MG tablet   rosuvastatin (CRESTOR) 5 MG tablet   Essential hypertension   Relevant Medications   icosapent Ethyl (VASCEPA) 1 g capsule   fenofibrate 160 MG tablet   rosuvastatin (CRESTOR) 5 MG tablet   Other Relevant Orders   CBC with Differential/Platelet   Comprehensive metabolic panel   History of esophageal stricture   History of herpes genitalis   Relevant Medications   valACYclovir (VALTREX) 1000 MG tablet   HIV disease (HCC)   Relevant Medications   bictegravir-emtricitabine-tenofovir AF (BIKTARVY) 50-200-25 MG TABS tablet   valACYclovir (VALTREX) 1000 MG tablet   Other Relevant Orders   CBC with Differential/Platelet   Comprehensive metabolic panel   HIV-1 RNA quant-no reflex-bld   [facility]  Hyperlipidemia    Relevant Medications   icosapent Ethyl (VASCEPA)  1 g capsule   fenofibrate 160 MG tablet   rosuvastatin (CRESTOR) 5 MG tablet   Other Relevant Orders   Lipid panel   Stage 3a chronic kidney disease (HCC)   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)   Relevant Medications   rosuvastatin (CRESTOR) 5 MG tablet   Other Visit Diagnoses     Hypertriglyceridemia       Relevant Medications   icosapent Ethyl (VASCEPA) 1 g capsule   fenofibrate 160 MG tablet   rosuvastatin (CRESTOR) 5 MG tablet   Transient insomnia of non-organic origin       Relevant Medications   zolpidem (AMBIEN) 10 MG tablet   Change in facial mole       Relevant Orders   Ambulatory referral to Dermatology      Return in about 1 year (around 10/21/2022) for cpe .     Jill Alexanders, MD

## 2021-10-20 NOTE — Addendum Note (Signed)
Addended by: Elyse Jarvis on: 10/20/2021 04:47 PM   Modules accepted: Orders

## 2021-10-20 NOTE — Patient Instructions (Signed)
Health Maintenance, Male Adopting a healthy lifestyle and getting preventive care are important in promoting health and wellness. Ask your health care provider about: The right schedule for you to have regular tests and exams. Things you can do on your own to prevent diseases and keep yourself healthy. What should I know about diet, weight, and exercise? Eat a healthy diet  Eat a diet that includes plenty of vegetables, fruits, low-fat dairy products, and lean protein. Do not eat a lot of foods that are high in solid fats, added sugars, or sodium. Maintain a healthy weight Body mass index (BMI) is a measurement that can be used to identify possible weight problems. It estimates body fat based on height and weight. Your health care provider can help determine your BMI and help you achieve or maintain a healthy weight. Get regular exercise Get regular exercise. This is one of the most important things you can do for your health. Most adults should: Exercise for at least 150 minutes each week. The exercise should increase your heart rate and make you sweat (moderate-intensity exercise). Do strengthening exercises at least twice a week. This is in addition to the moderate-intensity exercise. Spend less time sitting. Even light physical activity can be beneficial. Watch cholesterol and blood lipids Have your blood tested for lipids and cholesterol at 57 years of age, then have this test every 5 years. You may need to have your cholesterol levels checked more often if: Your lipid or cholesterol levels are high. You are older than 57 years of age. You are at high risk for heart disease. What should I know about cancer screening? Many types of cancers can be detected early and may often be prevented. Depending on your health history and family history, you may need to have cancer screening at various ages. This may include screening for: Colorectal cancer. Prostate cancer. Skin cancer. Lung  cancer. What should I know about heart disease, diabetes, and high blood pressure? Blood pressure and heart disease High blood pressure causes heart disease and increases the risk of stroke. This is more likely to develop in people who have high blood pressure readings or are overweight. Talk with your health care provider about your target blood pressure readings. Have your blood pressure checked: Every 3-5 years if you are 18-39 years of age. Every year if you are 40 years old or older. If you are between the ages of 65 and 75 and are a current or former smoker, ask your health care provider if you should have a one-time screening for abdominal aortic aneurysm (AAA). Diabetes Have regular diabetes screenings. This checks your fasting blood sugar level. Have the screening done: Once every three years after age 45 if you are at a normal weight and have a low risk for diabetes. More often and at a younger age if you are overweight or have a high risk for diabetes. What should I know about preventing infection? Hepatitis B If you have a higher risk for hepatitis B, you should be screened for this virus. Talk with your health care provider to find out if you are at risk for hepatitis B infection. Hepatitis C Blood testing is recommended for: Everyone born from 1945 through 1965. Anyone with known risk factors for hepatitis C. Sexually transmitted infections (STIs) You should be screened each year for STIs, including gonorrhea and chlamydia, if: You are sexually active and are younger than 57 years of age. You are older than 57 years of age and your   health care provider tells you that you are at risk for this type of infection. Your sexual activity has changed since you were last screened, and you are at increased risk for chlamydia or gonorrhea. Ask your health care provider if you are at risk. Ask your health care provider about whether you are at high risk for HIV. Your health care provider  may recommend a prescription medicine to help prevent HIV infection. If you choose to take medicine to prevent HIV, you should first get tested for HIV. You should then be tested every 3 months for as long as you are taking the medicine. Follow these instructions at home: Alcohol use Do not drink alcohol if your health care provider tells you not to drink. If you drink alcohol: Limit how much you have to 0-2 drinks a day. Know how much alcohol is in your drink. In the [place] one drink equals one 12 oz bottle of beer (355 mL), one 5 oz glass of wine (148 mL), or one 1 oz glass of hard liquor (44 mL). Lifestyle Do not use any products that contain nicotine or tobacco. These products include cigarettes, chewing tobacco, and vaping devices, such as e-cigarettes. If you need help quitting, ask your health care provider. Do not use street drugs. Do not share needles. Ask your health care provider for help if you need support or information about quitting drugs. General instructions Schedule regular health, dental, and eye exams. Stay current with your vaccines. Tell your health care provider if: You often feel depressed. You have ever been abused or do not feel safe at home. Summary Adopting a healthy lifestyle and getting preventive care are important in promoting health and wellness. Follow your health care provider's instructions about healthy diet, exercising, and getting tested or screened for diseases. Follow your health care provider's instructions on monitoring your cholesterol and blood pressure. This information is not intended to replace advice given to you by your health care provider. Make sure you discuss any questions you have with your health care provider. Document Revised: 07/05/2020 Document Reviewed: 07/05/2020 Elsevier Patient Education  2023 Elsevier Inc.  

## 2021-10-21 LAB — T-HELPER CELLS (CD4) COUNT (NOT AT ARMC)
% CD 4 Pos. Lymph.: 30.5 % — ABNORMAL LOW (ref 30.8–58.5)
Absolute CD 4 Helper: 915 /uL (ref 359–1519)
Basophils Absolute: 0 10*3/uL (ref 0.0–0.2)
Basos: 1 %
EOS (ABSOLUTE): 0.1 10*3/uL (ref 0.0–0.4)
Eos: 1 %
Hematocrit: 50.9 % (ref 37.5–51.0)
Hemoglobin: 17.8 g/dL — ABNORMAL HIGH (ref 13.0–17.7)
Immature Grans (Abs): 0 10*3/uL (ref 0.0–0.1)
Immature Granulocytes: 0 %
Lymphocytes Absolute: 3 10*3/uL (ref 0.7–3.1)
Lymphs: 49 %
MCH: 33.8 pg — ABNORMAL HIGH (ref 26.6–33.0)
MCHC: 35 g/dL (ref 31.5–35.7)
MCV: 97 fL (ref 79–97)
Monocytes Absolute: 0.6 10*3/uL (ref 0.1–0.9)
Monocytes: 11 %
Neutrophils Absolute: 2.3 10*3/uL (ref 1.4–7.0)
Neutrophils: 38 %
Platelets: 238 10*3/uL (ref 150–450)
RBC: 5.26 x10E6/uL (ref 4.14–5.80)
RDW: 12.3 % (ref 11.6–15.4)
WBC: 6 10*3/uL (ref 3.4–10.8)

## 2021-10-21 LAB — COMPREHENSIVE METABOLIC PANEL
ALT: 31 IU/L (ref 0–44)
AST: 19 IU/L (ref 0–40)
Albumin/Globulin Ratio: 2 (ref 1.2–2.2)
Albumin: 5.3 g/dL — ABNORMAL HIGH (ref 3.8–4.9)
Alkaline Phosphatase: 55 IU/L (ref 44–121)
BUN/Creatinine Ratio: 9 (ref 9–20)
BUN: 14 mg/dL (ref 6–24)
Bilirubin Total: 1.1 mg/dL (ref 0.0–1.2)
CO2: 22 mmol/L (ref 20–29)
Calcium: 10.5 mg/dL — ABNORMAL HIGH (ref 8.7–10.2)
Chloride: 99 mmol/L (ref 96–106)
Creatinine, Ser: 1.6 mg/dL — ABNORMAL HIGH (ref 0.76–1.27)
Globulin, Total: 2.6 g/dL (ref 1.5–4.5)
Glucose: 107 mg/dL — ABNORMAL HIGH (ref 70–99)
Potassium: 4.6 mmol/L (ref 3.5–5.2)
Sodium: 138 mmol/L (ref 134–144)
Total Protein: 7.9 g/dL (ref 6.0–8.5)
eGFR: 50 mL/min/{1.73_m2} — ABNORMAL LOW (ref >59)

## 2021-10-21 LAB — LIPID PANEL
Chol/HDL Ratio: 3.2 ratio (ref 0.0–5.0)
Cholesterol, Total: 142 mg/dL (ref 100–199)
HDL: 44 mg/dL (ref >39)
LDL Chol Calc (NIH): 79 mg/dL (ref 0–99)
Triglycerides: 100 mg/dL (ref 0–149)
VLDL Cholesterol Cal: 19 mg/dL (ref 5–40)

## 2021-10-21 LAB — HIV-1 RNA QUANT-NO REFLEX-BLD: HIV-1 RNA Viral Load: <20 copies/mL

## 2021-11-02 ENCOUNTER — Encounter: Payer: Self-pay | Admitting: Internal Medicine

## 2021-11-09 ENCOUNTER — Encounter: Payer: Self-pay | Admitting: Family Medicine

## 2021-11-15 ENCOUNTER — Other Ambulatory Visit (HOSPITAL_COMMUNITY): Payer: Self-pay

## 2021-11-15 ENCOUNTER — Other Ambulatory Visit: Payer: Self-pay | Admitting: Pharmacist

## 2021-11-15 DIAGNOSIS — B20 Human immunodeficiency virus [HIV] disease: Secondary | ICD-10-CM

## 2021-11-15 MED ORDER — BIKTARVY 50-200-25 MG PO TABS
1.0000 | ORAL_TABLET | Freq: Every day | ORAL | 3 refills | Status: DC
  Filled 2021-11-15: qty 30, 30d supply, fill #0
  Filled 2021-12-13: qty 30, 30d supply, fill #1
  Filled 2022-01-25: qty 30, 30d supply, fill #2
  Filled 2022-02-16: qty 30, 30d supply, fill #3
  Filled 2022-03-23: qty 30, 30d supply, fill #4
  Filled 2022-04-19: qty 30, 30d supply, fill #5
  Filled 2022-05-17: qty 30, 30d supply, fill #6
  Filled 2022-06-15: qty 30, 30d supply, fill #7
  Filled 2022-07-19: qty 30, 30d supply, fill #8
  Filled 2022-08-10: qty 30, 30d supply, fill #9
  Filled 2022-09-13: qty 30, 30d supply, fill #10
  Filled 2022-10-11: qty 30, 30d supply, fill #11

## 2021-11-17 ENCOUNTER — Other Ambulatory Visit (HOSPITAL_BASED_OUTPATIENT_CLINIC_OR_DEPARTMENT_OTHER): Payer: Self-pay

## 2021-11-22 ENCOUNTER — Other Ambulatory Visit (HOSPITAL_COMMUNITY): Payer: Self-pay

## 2021-12-13 ENCOUNTER — Other Ambulatory Visit (HOSPITAL_COMMUNITY): Payer: Self-pay

## 2021-12-14 ENCOUNTER — Other Ambulatory Visit (HOSPITAL_BASED_OUTPATIENT_CLINIC_OR_DEPARTMENT_OTHER): Payer: Self-pay

## 2021-12-14 ENCOUNTER — Other Ambulatory Visit: Payer: Self-pay | Admitting: Family Medicine

## 2021-12-14 DIAGNOSIS — Z8719 Personal history of other diseases of the digestive system: Secondary | ICD-10-CM

## 2021-12-14 MED ORDER — PANTOPRAZOLE SODIUM 40 MG PO TBEC
40.0000 mg | DELAYED_RELEASE_TABLET | Freq: Every day | ORAL | 1 refills | Status: DC
  Filled 2021-12-14: qty 90, 90d supply, fill #0
  Filled 2022-03-21: qty 90, 90d supply, fill #1

## 2021-12-15 ENCOUNTER — Other Ambulatory Visit (HOSPITAL_BASED_OUTPATIENT_CLINIC_OR_DEPARTMENT_OTHER): Payer: Self-pay

## 2021-12-19 ENCOUNTER — Encounter: Payer: Self-pay | Admitting: Internal Medicine

## 2021-12-20 ENCOUNTER — Other Ambulatory Visit (HOSPITAL_COMMUNITY): Payer: Self-pay

## 2022-01-11 ENCOUNTER — Other Ambulatory Visit (HOSPITAL_COMMUNITY): Payer: Self-pay

## 2022-01-13 ENCOUNTER — Other Ambulatory Visit (HOSPITAL_COMMUNITY): Payer: Self-pay

## 2022-01-17 ENCOUNTER — Other Ambulatory Visit: Payer: Self-pay | Admitting: Family Medicine

## 2022-01-17 ENCOUNTER — Other Ambulatory Visit (HOSPITAL_BASED_OUTPATIENT_CLINIC_OR_DEPARTMENT_OTHER): Payer: Self-pay

## 2022-01-17 DIAGNOSIS — Z8619 Personal history of other infectious and parasitic diseases: Secondary | ICD-10-CM

## 2022-01-17 MED ORDER — VALACYCLOVIR HCL 1 G PO TABS
1000.0000 mg | ORAL_TABLET | Freq: Every day | ORAL | 1 refills | Status: DC
  Filled 2022-01-17: qty 30, 30d supply, fill #0
  Filled 2022-02-16: qty 30, 30d supply, fill #1

## 2022-01-17 NOTE — Telephone Encounter (Signed)
Is this okay to refill? 

## 2022-01-25 ENCOUNTER — Other Ambulatory Visit (HOSPITAL_BASED_OUTPATIENT_CLINIC_OR_DEPARTMENT_OTHER): Payer: Self-pay

## 2022-01-25 ENCOUNTER — Other Ambulatory Visit (HOSPITAL_COMMUNITY): Payer: Self-pay

## 2022-01-26 ENCOUNTER — Other Ambulatory Visit (HOSPITAL_COMMUNITY): Payer: Self-pay

## 2022-01-28 ENCOUNTER — Other Ambulatory Visit (HOSPITAL_BASED_OUTPATIENT_CLINIC_OR_DEPARTMENT_OTHER): Payer: Self-pay

## 2022-02-14 ENCOUNTER — Other Ambulatory Visit (HOSPITAL_COMMUNITY): Payer: Self-pay

## 2022-02-16 ENCOUNTER — Other Ambulatory Visit (HOSPITAL_BASED_OUTPATIENT_CLINIC_OR_DEPARTMENT_OTHER): Payer: Self-pay

## 2022-02-16 ENCOUNTER — Other Ambulatory Visit (HOSPITAL_COMMUNITY): Payer: Self-pay

## 2022-02-16 ENCOUNTER — Other Ambulatory Visit: Payer: Self-pay

## 2022-02-22 ENCOUNTER — Other Ambulatory Visit: Payer: Self-pay

## 2022-03-08 ENCOUNTER — Encounter: Payer: Self-pay | Admitting: Internal Medicine

## 2022-03-15 ENCOUNTER — Other Ambulatory Visit (HOSPITAL_COMMUNITY): Payer: Self-pay

## 2022-03-17 ENCOUNTER — Other Ambulatory Visit (HOSPITAL_COMMUNITY): Payer: Self-pay

## 2022-03-21 ENCOUNTER — Other Ambulatory Visit (HOSPITAL_BASED_OUTPATIENT_CLINIC_OR_DEPARTMENT_OTHER): Payer: Self-pay

## 2022-03-21 ENCOUNTER — Other Ambulatory Visit: Payer: Self-pay

## 2022-03-21 ENCOUNTER — Other Ambulatory Visit: Payer: Self-pay | Admitting: Family Medicine

## 2022-03-21 DIAGNOSIS — Z8619 Personal history of other infectious and parasitic diseases: Secondary | ICD-10-CM

## 2022-03-21 MED ORDER — VALACYCLOVIR HCL 1 G PO TABS
1000.0000 mg | ORAL_TABLET | Freq: Every day | ORAL | 1 refills | Status: DC
  Filled 2022-03-21: qty 30, 30d supply, fill #0
  Filled 2022-04-20: qty 30, 30d supply, fill #1

## 2022-03-22 ENCOUNTER — Other Ambulatory Visit (HOSPITAL_BASED_OUTPATIENT_CLINIC_OR_DEPARTMENT_OTHER): Payer: Self-pay

## 2022-03-22 DIAGNOSIS — L820 Inflamed seborrheic keratosis: Secondary | ICD-10-CM | POA: Diagnosis not present

## 2022-03-22 DIAGNOSIS — L298 Other pruritus: Secondary | ICD-10-CM | POA: Diagnosis not present

## 2022-03-22 DIAGNOSIS — D0462 Carcinoma in situ of skin of left upper limb, including shoulder: Secondary | ICD-10-CM | POA: Diagnosis not present

## 2022-03-22 DIAGNOSIS — Z789 Other specified health status: Secondary | ICD-10-CM | POA: Diagnosis not present

## 2022-03-22 DIAGNOSIS — L538 Other specified erythematous conditions: Secondary | ICD-10-CM | POA: Diagnosis not present

## 2022-03-22 MED ORDER — FLUOROURACIL 5 % EX CREA
1.0000 | TOPICAL_CREAM | Freq: Two times a day (BID) | CUTANEOUS | 1 refills | Status: DC
Start: 2022-03-22 — End: 2022-04-27
  Filled 2022-03-22: qty 40, 30d supply, fill #0

## 2022-03-23 ENCOUNTER — Other Ambulatory Visit (HOSPITAL_COMMUNITY): Payer: Self-pay

## 2022-03-24 ENCOUNTER — Other Ambulatory Visit (HOSPITAL_COMMUNITY): Payer: Self-pay

## 2022-04-19 ENCOUNTER — Other Ambulatory Visit (HOSPITAL_COMMUNITY): Payer: Self-pay

## 2022-04-20 ENCOUNTER — Other Ambulatory Visit (HOSPITAL_COMMUNITY): Payer: Self-pay

## 2022-04-20 ENCOUNTER — Other Ambulatory Visit (HOSPITAL_BASED_OUTPATIENT_CLINIC_OR_DEPARTMENT_OTHER): Payer: Self-pay

## 2022-04-20 ENCOUNTER — Other Ambulatory Visit: Payer: Self-pay

## 2022-04-24 ENCOUNTER — Other Ambulatory Visit: Payer: 59

## 2022-04-24 ENCOUNTER — Other Ambulatory Visit: Payer: Self-pay | Admitting: Family Medicine

## 2022-04-24 DIAGNOSIS — B20 Human immunodeficiency virus [HIV] disease: Secondary | ICD-10-CM

## 2022-04-24 LAB — LIPID PANEL

## 2022-04-25 LAB — T-HELPER CELLS (CD4) COUNT (NOT AT ARMC)
% CD 4 Pos. Lymph.: 37.3 % (ref 30.8–58.5)
Absolute CD 4 Helper: 1417 /uL (ref 359–1519)
Basophils Absolute: 0.1 10*3/uL (ref 0.0–0.2)
Basos: 1 %
EOS (ABSOLUTE): 0.1 10*3/uL (ref 0.0–0.4)
Eos: 2 %
Hematocrit: 47.5 % (ref 37.5–51.0)
Hemoglobin: 16.8 g/dL (ref 13.0–17.7)
Immature Grans (Abs): 0 10*3/uL (ref 0.0–0.1)
Immature Granulocytes: 0 %
Lymphocytes Absolute: 3.8 10*3/uL — ABNORMAL HIGH (ref 0.7–3.1)
Lymphs: 47 %
MCH: 35.3 pg — ABNORMAL HIGH (ref 26.6–33.0)
MCHC: 35.4 g/dL (ref 31.5–35.7)
MCV: 100 fL — ABNORMAL HIGH (ref 79–97)
Monocytes Absolute: 0.9 10*3/uL (ref 0.1–0.9)
Monocytes: 11 %
Neutrophils Absolute: 3.2 10*3/uL (ref 1.4–7.0)
Neutrophils: 39 %
Platelets: 229 10*3/uL (ref 150–450)
RBC: 4.76 x10E6/uL (ref 4.14–5.80)
RDW: 12.9 % (ref 11.6–15.4)
WBC: 8.1 10*3/uL (ref 3.4–10.8)

## 2022-04-25 LAB — LIPID PANEL
Chol/HDL Ratio: 3 ratio (ref 0.0–5.0)
Cholesterol, Total: 139 mg/dL (ref 100–199)
HDL: 46 mg/dL (ref >39)
LDL Chol Calc (NIH): 74 mg/dL (ref 0–99)
Triglycerides: 103 mg/dL (ref 0–149)
VLDL Cholesterol Cal: 19 mg/dL (ref 5–40)

## 2022-04-25 LAB — COMPREHENSIVE METABOLIC PANEL
ALT: 49 IU/L — ABNORMAL HIGH (ref 0–44)
AST: 34 IU/L (ref 0–40)
Albumin/Globulin Ratio: 2 (ref 1.2–2.2)
Albumin: 4.9 g/dL (ref 3.8–4.9)
Alkaline Phosphatase: 57 IU/L (ref 44–121)
BUN/Creatinine Ratio: 10 (ref 9–20)
BUN: 16 mg/dL (ref 6–24)
Bilirubin Total: 0.7 mg/dL (ref 0.0–1.2)
CO2: 22 mmol/L (ref 20–29)
Calcium: 9.8 mg/dL (ref 8.7–10.2)
Chloride: 101 mmol/L (ref 96–106)
Creatinine, Ser: 1.55 mg/dL — ABNORMAL HIGH (ref 0.76–1.27)
Globulin, Total: 2.4 g/dL (ref 1.5–4.5)
Glucose: 94 mg/dL (ref 70–99)
Potassium: 4.1 mmol/L (ref 3.5–5.2)
Sodium: 139 mmol/L (ref 134–144)
Total Protein: 7.3 g/dL (ref 6.0–8.5)
eGFR: 52 mL/min/{1.73_m2} — ABNORMAL LOW (ref >59)

## 2022-04-25 LAB — HIV-1 RNA QUANT-NO REFLEX-BLD: HIV-1 RNA Viral Load: <20 copies/mL

## 2022-04-27 ENCOUNTER — Encounter: Payer: Self-pay | Admitting: Family Medicine

## 2022-04-27 ENCOUNTER — Other Ambulatory Visit: Payer: Self-pay

## 2022-04-27 ENCOUNTER — Telehealth (INDEPENDENT_AMBULATORY_CARE_PROVIDER_SITE_OTHER): Payer: 59 | Admitting: Family Medicine

## 2022-04-27 VITALS — Temp 101.4°F | Ht 69.0 in | Wt 181.0 lb

## 2022-04-27 DIAGNOSIS — B20 Human immunodeficiency virus [HIV] disease: Secondary | ICD-10-CM

## 2022-04-27 DIAGNOSIS — J111 Influenza due to unidentified influenza virus with other respiratory manifestations: Secondary | ICD-10-CM | POA: Diagnosis not present

## 2022-04-27 DIAGNOSIS — N1831 Chronic kidney disease, stage 3a: Secondary | ICD-10-CM

## 2022-04-27 DIAGNOSIS — I10 Essential (primary) hypertension: Secondary | ICD-10-CM | POA: Diagnosis not present

## 2022-04-27 MED ORDER — LOSARTAN POTASSIUM 25 MG PO TABS
25.0000 mg | ORAL_TABLET | Freq: Every day | ORAL | 3 refills | Status: DC
  Filled 2022-04-27: qty 90, 90d supply, fill #0
  Filled 2022-07-27: qty 90, 90d supply, fill #1

## 2022-04-27 NOTE — Progress Notes (Signed)
   Subjective:    Patient ID: Darryl Black, male    DOB: 09-23-1965, 57 y.o.   MRN: [id number]  HPI Documentation for virtual audio and video telecommunications through [place] encounter: The patient was located at home. 2 patient identifiers used.  The provider was located in the office. The patient did consent to this visit and is aware of possible charges through their insurance for this visit. The other persons participating in this telemedicine service were none. Time spent on call was 5 minutes and in review of previous records >30 minutes total for counseling and coordination of care. This virtual service is not related to other E/M service within previous 7 days.  Today's visit is for multiple concerns.  He had recent blood work concerning his HIV.  He also has been checking his blood pressure and he does have a good cuff.  His blood pressure has been running above 130/80 in the last several months.  He also has a 1 day history of fever, fatigue, sore throat, myalgias and cough.  His husband was diagnosed with influenza A yesterday.  Review of Systems     Objective:   Physical Exam Alert and in no distress otherwise not examined.  Blood work was recently evaluated.       Assessment & Plan:  HIV disease ([place])  Essential hypertension - Plan: losartan (COZAAR) 25 MG tablet  Stage 3a chronic kidney disease ([place])  Influenza His HIV parameters are unchanged and he will continue on his present medication regimen.  His renal function is essentially unchanged. Since his blood pressure is consistently elevated, I will start him on low-dose losartan.  He will send me a message on MyChart in 1 month with his blood pressure readings.  We will adjust accordingly. Discussed the influenza that he most likely has and he has decided to treat this symptomatically.

## 2022-05-04 ENCOUNTER — Other Ambulatory Visit (HOSPITAL_BASED_OUTPATIENT_CLINIC_OR_DEPARTMENT_OTHER): Payer: Self-pay

## 2022-05-16 ENCOUNTER — Other Ambulatory Visit (HOSPITAL_COMMUNITY): Payer: Self-pay

## 2022-05-16 DIAGNOSIS — L814 Other melanin hyperpigmentation: Secondary | ICD-10-CM | POA: Diagnosis not present

## 2022-05-16 DIAGNOSIS — D225 Melanocytic nevi of trunk: Secondary | ICD-10-CM | POA: Diagnosis not present

## 2022-05-16 DIAGNOSIS — Z08 Encounter for follow-up examination after completed treatment for malignant neoplasm: Secondary | ICD-10-CM | POA: Diagnosis not present

## 2022-05-16 DIAGNOSIS — L821 Other seborrheic keratosis: Secondary | ICD-10-CM | POA: Diagnosis not present

## 2022-05-16 DIAGNOSIS — Z85828 Personal history of other malignant neoplasm of skin: Secondary | ICD-10-CM | POA: Diagnosis not present

## 2022-05-17 ENCOUNTER — Other Ambulatory Visit (HOSPITAL_COMMUNITY): Payer: Self-pay

## 2022-05-18 ENCOUNTER — Other Ambulatory Visit: Payer: Self-pay | Admitting: Family Medicine

## 2022-05-18 ENCOUNTER — Other Ambulatory Visit (HOSPITAL_BASED_OUTPATIENT_CLINIC_OR_DEPARTMENT_OTHER): Payer: Self-pay

## 2022-05-18 DIAGNOSIS — F5102 Adjustment insomnia: Secondary | ICD-10-CM

## 2022-05-18 DIAGNOSIS — Z8619 Personal history of other infectious and parasitic diseases: Secondary | ICD-10-CM

## 2022-05-18 MED ORDER — ZOLPIDEM TARTRATE 10 MG PO TABS
5.0000 mg | ORAL_TABLET | Freq: Every evening | ORAL | 0 refills | Status: DC | PRN
  Filled 2022-05-18: qty 30, 30d supply, fill #0

## 2022-05-18 MED ORDER — VALACYCLOVIR HCL 1 G PO TABS
1000.0000 mg | ORAL_TABLET | Freq: Every day | ORAL | 1 refills | Status: DC
  Filled 2022-05-18: qty 30, 30d supply, fill #0
  Filled 2022-06-22: qty 30, 30d supply, fill #1

## 2022-05-18 NOTE — Telephone Encounter (Signed)
Refill request last apt 04/27/22 next apt 10/26/22.

## 2022-05-22 ENCOUNTER — Encounter: Payer: Self-pay | Admitting: Family Medicine

## 2022-05-22 ENCOUNTER — Ambulatory Visit (INDEPENDENT_AMBULATORY_CARE_PROVIDER_SITE_OTHER): Payer: 59 | Admitting: Family Medicine

## 2022-05-22 ENCOUNTER — Other Ambulatory Visit: Payer: Self-pay

## 2022-05-22 VITALS — BP 132/82 | HR 62 | Temp 98.1°F | Resp 16 | Wt 186.4 lb

## 2022-05-22 DIAGNOSIS — S86812A Strain of other muscle(s) and tendon(s) at lower leg level, left leg, initial encounter: Secondary | ICD-10-CM

## 2022-05-22 NOTE — Progress Notes (Signed)
   Subjective:    Patient ID: Darryl Black, male    DOB: [birth year], 57 y.o.   MRN: [id number]  HPI He states that on Wednesday he woke up noticing some left lateral calf pain.  No history of injury.  He was sick and did take time off from working out but when he started back again he did note some myalgias from the workout.  No weakness, numbness, tingling, shortness of breath.  He does have a previous history of DVT which was related to a long plane flight.   Review of Systems     Objective:   Physical Exam Exam of the left leg shows good pulses with normal sensation.  Palpable lateral calf tenderness noted approximately 2 x 3 cm in size.       Assessment & Plan:  Strain of calf muscle, left, initial encounter I explained that I thought the pain came from an overly excessive workout to get himself in better shape.  Recommend heat 20 minutes 3 times per day, Tylenol and take a week or so off when he starts back start back at half and work up slowly.  He was comfortable with that.

## 2022-06-14 ENCOUNTER — Other Ambulatory Visit (HOSPITAL_COMMUNITY): Payer: Self-pay

## 2022-06-15 ENCOUNTER — Other Ambulatory Visit (HOSPITAL_COMMUNITY): Payer: Self-pay

## 2022-06-20 ENCOUNTER — Other Ambulatory Visit (HOSPITAL_COMMUNITY): Payer: Self-pay

## 2022-06-22 ENCOUNTER — Other Ambulatory Visit (HOSPITAL_BASED_OUTPATIENT_CLINIC_OR_DEPARTMENT_OTHER): Payer: Self-pay

## 2022-06-22 ENCOUNTER — Other Ambulatory Visit: Payer: Self-pay

## 2022-06-22 ENCOUNTER — Other Ambulatory Visit: Payer: Self-pay | Admitting: Family Medicine

## 2022-06-22 DIAGNOSIS — Z8719 Personal history of other diseases of the digestive system: Secondary | ICD-10-CM

## 2022-06-22 MED ORDER — PANTOPRAZOLE SODIUM 40 MG PO TBEC
40.0000 mg | DELAYED_RELEASE_TABLET | Freq: Every day | ORAL | 1 refills | Status: DC
  Filled 2022-06-22: qty 90, 90d supply, fill #0
  Filled 2022-10-16: qty 90, 90d supply, fill #1

## 2022-06-27 ENCOUNTER — Other Ambulatory Visit: Payer: Self-pay

## 2022-07-04 ENCOUNTER — Other Ambulatory Visit: Payer: Self-pay

## 2022-07-19 ENCOUNTER — Other Ambulatory Visit (HOSPITAL_COMMUNITY): Payer: Self-pay

## 2022-07-20 ENCOUNTER — Other Ambulatory Visit (HOSPITAL_BASED_OUTPATIENT_CLINIC_OR_DEPARTMENT_OTHER): Payer: Self-pay

## 2022-07-20 ENCOUNTER — Other Ambulatory Visit: Payer: Self-pay | Admitting: Family Medicine

## 2022-07-20 DIAGNOSIS — Z8619 Personal history of other infectious and parasitic diseases: Secondary | ICD-10-CM

## 2022-07-20 MED ORDER — VALACYCLOVIR HCL 1 G PO TABS
1000.0000 mg | ORAL_TABLET | Freq: Every day | ORAL | 1 refills | Status: DC
  Filled 2022-07-20: qty 30, 30d supply, fill #0
  Filled 2022-08-17: qty 30, 30d supply, fill #1

## 2022-07-21 ENCOUNTER — Other Ambulatory Visit (HOSPITAL_COMMUNITY): Payer: Self-pay

## 2022-07-27 ENCOUNTER — Other Ambulatory Visit (HOSPITAL_BASED_OUTPATIENT_CLINIC_OR_DEPARTMENT_OTHER): Payer: Self-pay

## 2022-08-02 ENCOUNTER — Ambulatory Visit (INDEPENDENT_AMBULATORY_CARE_PROVIDER_SITE_OTHER): Payer: 59 | Admitting: Family Medicine

## 2022-08-02 ENCOUNTER — Telehealth: Payer: Self-pay | Admitting: Family Medicine

## 2022-08-02 ENCOUNTER — Encounter: Payer: Self-pay | Admitting: Family Medicine

## 2022-08-02 ENCOUNTER — Other Ambulatory Visit (HOSPITAL_BASED_OUTPATIENT_CLINIC_OR_DEPARTMENT_OTHER): Payer: Self-pay

## 2022-08-02 VITALS — BP 120/84 | HR 88 | Temp 98.0°F | Wt 188.0 lb

## 2022-08-02 DIAGNOSIS — M62830 Muscle spasm of back: Secondary | ICD-10-CM | POA: Diagnosis not present

## 2022-08-02 DIAGNOSIS — I10 Essential (primary) hypertension: Secondary | ICD-10-CM

## 2022-08-02 MED ORDER — LOSARTAN POTASSIUM 50 MG PO TABS
50.0000 mg | ORAL_TABLET | Freq: Every day | ORAL | 3 refills | Status: DC
  Filled 2022-08-02: qty 90, 90d supply, fill #0
  Filled 2022-10-26: qty 90, 90d supply, fill #1
  Filled 2023-01-24: qty 90, 90d supply, fill #2

## 2022-08-02 NOTE — Progress Notes (Signed)
   Subjective:    Patient ID: Darryl Black, male    DOB: [birth year], 57 y.o.   MRN: [id number]  HPI He complains of difficulty of pain in the left trapezius area.  No history of injury or overuse.  He notes that when he especially is riding his bike or abducting the arms.  No numbness, tingling or weakness.  He also checks his blood pressure on a weekly basis with a cuff that has been measured against ours.  He has been running roughly 135/85.   Review of Systems     Objective:   Physical Exam No tenderness to palpation of the neck.  Good motion of the neck.  No palpable tenderness in the left trapezius area.       Assessment & Plan:  Spasm of left trapezius muscle - Plan: Ambulatory referral to Physical Therapy  Essential hypertension - Plan: losartan (COZAAR) 50 MG tablet No good reason for him to have trapezius difficulty but I think it is reasonable to send him to physical therapy to have this looked at and rehabilitated.  He might benefit from some ultrasound therapy. Since his pressure is up slightly I think it is reasonable to try and get that number down little bit I will therefore increase his losartan.

## 2022-08-02 NOTE — Telephone Encounter (Signed)
Pt called and state he would like to be referred to the [facility]

## 2022-08-10 ENCOUNTER — Other Ambulatory Visit (HOSPITAL_COMMUNITY): Payer: Self-pay

## 2022-08-15 ENCOUNTER — Telehealth: Payer: Self-pay | Admitting: *Deleted

## 2022-08-15 NOTE — Telephone Encounter (Signed)
Darryl Black  P Cv Div Dwb Scheduling (supporting Mychart, Generic)1 hour ago (2:30 PM)    I have been experiencing discomfort in my chest for the past two weeks. Do I need to see someone sooner or should I just wait for this appointment? _________________________________________________  Received message about from patient.  Called to discuss symptoms/offer sooner appointment than 08/30/22.  Left message for patient to call back.

## 2022-08-15 NOTE — Telephone Encounter (Signed)
Patient returned call.  He said he had developed back pain a few weeks ago and saw PCP who arranged physical therapy.  He has not attended yet.   Over the last week he has started to feel chest discomfort as well.  He said he thinks it is there at baseline and gets worse w exertion.  Not worse on palpation or with deep inspiration.    In addition BP was elevated at PCP and a BP med was increased.  He would like a sooner appointment to discuss.  I scheduled w Alden Server on 08/18/22.  Pt appreciative for assistance.

## 2022-08-17 ENCOUNTER — Other Ambulatory Visit (HOSPITAL_BASED_OUTPATIENT_CLINIC_OR_DEPARTMENT_OTHER): Payer: Self-pay

## 2022-08-17 NOTE — Progress Notes (Addendum)
Office Visit    Patient Name: Darryl Black Date of Encounter: 08/17/2022  Primary Care Provider:  Ronnald Nian, MD Primary Cardiologist:  Donato Schultz, MD Primary Electrophysiologist: None   Past Medical History    Past Medical History:  Diagnosis Date   DVT (deep venous thrombosis) (HCC)    after half marathon    Dyslipidemia    GERD (gastroesophageal reflux disease)    Gilbert's disease    HH (hiatus hernia)    HIV positive (HCC) 89   Hyperlipidemia    Renal stone    Past Surgical History:  Procedure Laterality Date   APPENDECTOMY     COLONOSCOPY     last 10 years + ago 2002 in epic- colon all normal   SKIN BIOPSY Right 03/14/2018   seborrheic keratosis inflamed   UPPER GASTROINTESTINAL ENDOSCOPY      Allergies  No Known Allergies   History of Present Illness    Darryl Black  is a 57 year old male with a PMH of SVT, HTN, HLD, HIV positive (on Biktarvy), hiatal hernia, CKD stage III, DM type II who presents today for worsening chest pain and elevated BP.  [name] was seen initially in 2021 by Dr. Anne Fu for complaint of palpitations.  2D echo was completed showing normal EF of 55-60 percent with no RWMA and trivial MVR.  Coronary CTA was arranged however patient's creatinine was elevated at that time.  He was seen last on 03/24/2021 with complaint of mild chest discomfort which he contributed to possible esophageal strictures.  Plan at that time was to not pursue any test revolving contrast and to pursue possible Myoview stress test if symptoms persisted.  He was noted to have elevated BP at that time but endorsed stopping ARB previously and had just resumed.  Mr. Darryl Black presents today with complaint of chest pain and elevated blood pressures.  Since last being seen in the office patient reports that he has been experiencing elevated blood pressures and pain in the middle of his back that radiated to his left arm while performing exercises.  During today's  visit he endorsed left sided chest pain that is continuous and not relieved with rest.  EKG was obtained showing new left bundle branch block and consultation was made with Dr. Elberta Fortis our DOD who recommended that he be seen in the ED.  He denied any syncope or palpitations with his chest pain.  He has tried ibuprofen with no relief of his symptoms.  He reports worsening of his pain with exercise and improvement when sitting up versus lying back.  His blood pressure today is controlled at 120/86 and heart rate was 72 bpm.  He reports compliance with his current medication regimen and denies any adverse reactions.  patient denies chest pain, palpitations, dyspnea, PND, orthopnea, nausea, vomiting, dizziness, syncope, edema, weight gain, or early satiety.   Current Outpatient Medications  Medication Sig Dispense Refill   bictegravir-emtricitabine-tenofovir AF (BIKTARVY) 50-200-25 MG TABS tablet Take 1 tablet by mouth daily. 90 tablet 3   cholecalciferol (VITAMIN D) 1000 UNITS tablet Take 1,000 Units by mouth daily.     fenofibrate 160 MG tablet TAKE 1 TABLET BY MOUTH ONCE A DAY 90 tablet 3   icosapent Ethyl (VASCEPA) 1 g capsule Take 2 capsules (2 g total) by mouth 2 (two) times daily. 360 capsule 3   losartan (COZAAR) 50 MG tablet Take 1 tablet (50 mg total) by mouth daily. 90 tablet 3  Multiple Vitamin (MULTIVITAMIN WITH MINERALS) TABS Take 1 tablet by mouth daily.     pantoprazole (PROTONIX) 40 MG tablet Take 1 tablet (40 mg total) by mouth daily. 90 tablet 1   rosuvastatin (CRESTOR) 5 MG tablet TAKE 1 TABLET BY MOUTH DAILY 90 tablet 3   valACYclovir (VALTREX) 1000 MG tablet Take 1 tablet (1,000 mg total) by mouth daily. 30 tablet 1   zolpidem (AMBIEN) 10 MG tablet Take 1/2 to 1 tablet (5-10 mg total) by mouth at bedtime as needed for sleep 30 tablet 0   No current facility-administered medications for this visit.     Review of Systems  Please see the history of present illness.    (+) Chest  pain (+) Mid back pain  All other systems reviewed and are otherwise negative except as noted above.  Physical Exam    Wt Readings from Last 3 Encounters:  08/02/22 188 lb (85.3 kg)  05/22/22 186 lb 6.4 oz (84.6 kg)  04/27/22 181 lb (82.1 kg)   HQ:IONGE were no vitals filed for this visit.,There is no height or weight on file to calculate BMI.  Constitutional:      Appearance: Healthy appearance. Not in distress.  Neck:     Vascular: JVD normal.  Pulmonary:     Effort: Pulmonary effort is normal.     Breath sounds: No wheezing. No rales. Diminished in the bases Cardiovascular:     Normal rate. Regular rhythm. Normal S1. Normal S2.      Murmurs: There is no murmur.  Edema:    Peripheral edema absent.  Abdominal:     Palpations: Abdomen is soft non tender. There is no hepatomegaly.  Skin:    General: Skin is warm and dry.  Neurological:     General: No focal deficit present.     Mental Status: Alert and oriented to person, place and time.     Cranial Nerves: Cranial nerves are intact.  EKG/LABS/ Recent Cardiac Studies    ECG personally reviewed by me today -sinus rhythm with rate of 68 bpm and right axis deviation with new LBBB with TWI in leads III  Cardiac Studies & Procedures       ECHOCARDIOGRAM  ECHOCARDIOGRAM COMPLETE 06/19/2019  Narrative ECHOCARDIOGRAM REPORT    Patient Name:   Darryl Black Date of Exam: 06/19/2019 Medical Rec #:  [id number]      Height:       69.0 in Accession #:    [id number]     Weight:       187.0 lb Date of Birth:  1965/10/23      BSA:          2.008 m Patient Age:    54 years       BP:           146/97 mmHg Patient Gender: M              HR:           63 bpm. Exam Location:  [address]  Procedure: 2D Echo, Cardiac Doppler and Color Doppler  Indications:    R07.9 Chest Pain  History:        Patient has no prior history of Echocardiogram examinations. DVT, Arrythmias:SVT; Risk Factors:Dyslipidemia and HLD. No cardiac  history.  Sonographer:    Clearence Ped RCS Referring Phys: [id number] MARK C SKAINS  IMPRESSIONS   1. Normal GLS -21.6. Left ventricular ejection fraction, by estimation, is 55 to 60%. The left ventricle  has normal function. The left ventricle has no regional wall motion abnormalities. Left ventricular diastolic parameters were normal. 2. Right ventricular systolic function is normal. The right ventricular size is normal. There is normal pulmonary artery systolic pressure. 3. The mitral valve is normal in structure. Trivial mitral valve regurgitation. No evidence of mitral stenosis. 4. The aortic valve is tricuspid. Aortic valve regurgitation is not visualized. Mild to moderate aortic valve sclerosis/calcification is present, without any evidence of aortic stenosis. 5. The inferior vena cava is normal in size with greater than 50% respiratory variability, suggesting right atrial pressure of 3 mmHg.  FINDINGS Left Ventricle: Normal GLS -21.6. Left ventricular ejection fraction, by estimation, is 55 to 60%. The left ventricle has normal function. The left ventricle has no regional wall motion abnormalities. The left ventricular internal cavity size was normal in size. There is no left ventricular hypertrophy. Left ventricular diastolic parameters were normal.  Right Ventricle: The right ventricular size is normal. No increase in right ventricular wall thickness. Right ventricular systolic function is normal. There is normal pulmonary artery systolic pressure. The tricuspid regurgitant velocity is 2.06 m/s, and with an assumed right atrial pressure of 3 mmHg, the estimated right ventricular systolic pressure is 20.0 mmHg.  Left Atrium: Left atrial size was normal in size.  Right Atrium: Right atrial size was normal in size.  Pericardium: There is no evidence of pericardial effusion.  Mitral Valve: The mitral valve is normal in structure. There is mild thickening of the mitral valve leaflet(s).  There is mild calcification of the mitral valve leaflet(s). Normal mobility of the mitral valve leaflets. Trivial mitral valve regurgitation. No evidence of mitral valve stenosis.  Tricuspid Valve: The tricuspid valve is normal in structure. Tricuspid valve regurgitation is trivial. No evidence of tricuspid stenosis.  Aortic Valve: The aortic valve is tricuspid. Aortic valve regurgitation is not visualized. Mild to moderate aortic valve sclerosis/calcification is present, without any evidence of aortic stenosis.  Pulmonic Valve: The pulmonic valve was normal in structure. Pulmonic valve regurgitation is trivial. No evidence of pulmonic stenosis.  Aorta: The aortic root is normal in size and structure.  Venous: The inferior vena cava is normal in size with greater than 50% respiratory variability, suggesting right atrial pressure of 3 mmHg.  IAS/Shunts: No atrial level shunt detected by color flow Doppler.   LEFT VENTRICLE PLAX 2D LVIDd:         4.27 cm  Diastology LVIDs:         2.70 cm  LV e' lateral:   11.60 cm/s LV PW:         1.14 cm  LV E/e' lateral: 6.3 LV IVS:        1.03 cm  LV e' medial:    7.62 cm/s LVOT diam:     2.10 cm  LV E/e' medial:  9.6 LV SV:         71 LV SV Index:   35 LVOT Area:     3.46 cm   RIGHT VENTRICLE RV Basal diam:  3.64 cm RV S prime:     11.70 cm/s TAPSE (M-mode): 2.1 cm RVSP:           20.0 mmHg  LEFT ATRIUM             Index       RIGHT ATRIUM           Index LA diam:        3.70 cm 1.84 cm/m  RA  Pressure: 3.00 mmHg LA Vol (A2C):   46.6 ml 23.21 ml/m RA Area:     14.00 cm LA Vol (A4C):   48.5 ml 24.16 ml/m RA Volume:   31.40 ml  15.64 ml/m LA Biplane Vol: 47.7 ml 23.76 ml/m AORTIC VALVE LVOT Vmax:   102.00 cm/s LVOT Vmean:  68.200 cm/s LVOT VTI:    0.205 m  AORTA Ao Root diam: 3.30 cm  MITRAL VALVE               TRICUSPID VALVE MV Area (PHT):             TR Peak grad:   17.0 mmHg MV Decel Time:             TR Vmax:        206.00  cm/s MV E velocity: 72.80 cm/s  Estimated RAP:  3.00 mmHg MV A velocity: 65.00 cm/s  RVSP:           20.0 mmHg MV E/A ratio:  1.12 SHUNTS Systemic VTI:  0.20 m Systemic Diam: 2.10 cm  Charlton Haws MD Electronically signed by Charlton Haws MD Signature Date/Time: 06/19/2019/3:47:25 PM    Final    MONITORS  CARDIAC EVENT MONITOR 06/03/2019  Narrative  Sinus bradycardia to sinus tachycardia.  2 episodes of SVT with ventricular rate 160 - 168 bpm.  No pauses or other arrhythmias.  2 episodes of SVT with ventricular rate 160 - 168 bpm. The patient didn't document any symptoms. A referral to cardiology is recommended.            Lab Results  Component Value Date   WBC 8.1 04/24/2022   HGB 16.8 04/24/2022   HCT 47.5 04/24/2022   MCV 100 (H) 04/24/2022   PLT 229 04/24/2022   Lab Results  Component Value Date   CREATININE 1.55 (H) 04/24/2022   BUN 16 04/24/2022   NA 139 04/24/2022   K 4.1 04/24/2022   CL 101 04/24/2022   CO2 22 04/24/2022   Lab Results  Component Value Date   ALT 49 (H) 04/24/2022   AST 34 04/24/2022   ALKPHOS 57 04/24/2022   BILITOT 0.7 04/24/2022   Lab Results  Component Value Date   CHOL 139 04/24/2022   HDL 46 04/24/2022   LDLCALC 74 04/24/2022   TRIG 103 04/24/2022   CHOLHDL 3.0 04/24/2022    Lab Results  Component Value Date   HGBA1C 5.8 (A) 10/20/2021     Assessment & Plan    1.  Chest pain: -Patient reports increased chest pain with exertion -Today patient reports that his chest pain has not been relieved with rest and is worse with exercise. -EKG was obtained showing new LBBB consultation was made with DOD Dr. Elberta Fortis who suggested patient be seen in the ED for further evaluation.  2.  Essential hypertension: -Patient recently had elevated blood pressures at PCP and medications increased -Today patient's blood pressure is controlled at 120/86  3.  Stage IIIa CKD: -Patient's creatinine today is 1.5 -Unable to complete  test with contrast due to elevated creatinine at baseline  4.  Hyperlipidemia: -Patient's LDL cholesterol is 74 -Continue Crestor 5 mg daily  5.  Hiatal hernia: -Patient has history of hiatal hernia  Disposition: Follow-up with Donato Schultz, MD or APP in 1 months    Medication Adjustments/Labs and Tests Ordered: Current medicines are reviewed at length with the patient today.  Concerns regarding medicines are outlined above.   Signed, Neila Gear  Sherilyn Cooter, NP 08/17/2022, 1:33 PM [facility]

## 2022-08-18 ENCOUNTER — Encounter: Payer: Self-pay | Admitting: Nurse Practitioner

## 2022-08-18 ENCOUNTER — Emergency Department (HOSPITAL_COMMUNITY)
Admission: EM | Admit: 2022-08-18 | Discharge: 2022-08-18 | Disposition: A | Discharge status: Home / Self Care | Payer: 59 | Attending: Emergency Medicine | Admitting: Emergency Medicine

## 2022-08-18 ENCOUNTER — Other Ambulatory Visit (HOSPITAL_COMMUNITY): Payer: Self-pay

## 2022-08-18 ENCOUNTER — Emergency Department (HOSPITAL_COMMUNITY): Payer: 59

## 2022-08-18 ENCOUNTER — Other Ambulatory Visit: Payer: Self-pay

## 2022-08-18 ENCOUNTER — Ambulatory Visit: Payer: 59 | Attending: Nurse Practitioner | Admitting: Nurse Practitioner

## 2022-08-18 ENCOUNTER — Encounter (HOSPITAL_COMMUNITY): Payer: Self-pay

## 2022-08-18 VITALS — BP 120/86 | HR 72 | Ht 69.0 in | Wt 186.6 lb

## 2022-08-18 DIAGNOSIS — K760 Fatty (change of) liver, not elsewhere classified: Secondary | ICD-10-CM | POA: Diagnosis not present

## 2022-08-18 DIAGNOSIS — E785 Hyperlipidemia, unspecified: Secondary | ICD-10-CM

## 2022-08-18 DIAGNOSIS — Z21 Asymptomatic human immunodeficiency virus [HIV] infection status: Secondary | ICD-10-CM | POA: Insufficient documentation

## 2022-08-18 DIAGNOSIS — I700 Atherosclerosis of aorta: Secondary | ICD-10-CM | POA: Diagnosis not present

## 2022-08-18 DIAGNOSIS — K429 Umbilical hernia without obstruction or gangrene: Secondary | ICD-10-CM | POA: Diagnosis not present

## 2022-08-18 DIAGNOSIS — R079 Chest pain, unspecified: Secondary | ICD-10-CM

## 2022-08-18 DIAGNOSIS — K449 Diaphragmatic hernia without obstruction or gangrene: Secondary | ICD-10-CM

## 2022-08-18 DIAGNOSIS — I10 Essential (primary) hypertension: Secondary | ICD-10-CM

## 2022-08-18 DIAGNOSIS — R935 Abnormal findings on diagnostic imaging of other abdominal regions, including retroperitoneum: Secondary | ICD-10-CM | POA: Diagnosis not present

## 2022-08-18 DIAGNOSIS — E119 Type 2 diabetes mellitus without complications: Secondary | ICD-10-CM

## 2022-08-18 DIAGNOSIS — R0789 Other chest pain: Secondary | ICD-10-CM | POA: Diagnosis not present

## 2022-08-18 DIAGNOSIS — N400 Enlarged prostate without lower urinary tract symptoms: Secondary | ICD-10-CM | POA: Diagnosis not present

## 2022-08-18 LAB — CBC
HCT: 46 % (ref 39.0–52.0)
Hemoglobin: 16.4 g/dL (ref 13.0–17.0)
MCH: 35 pg — ABNORMAL HIGH (ref 26.0–34.0)
MCHC: 35.7 g/dL (ref 30.0–36.0)
MCV: 98.3 fL (ref 80.0–100.0)
Platelets: 216 10*3/uL (ref 150–400)
RBC: 4.68 MIL/uL (ref 4.22–5.81)
RDW: 12.2 % (ref 11.5–15.5)
WBC: 8.2 10*3/uL (ref 4.0–10.5)
nRBC: 0 % (ref 0.0–0.2)

## 2022-08-18 LAB — BASIC METABOLIC PANEL
Anion gap: 11 (ref 5–15)
BUN: 18 mg/dL (ref 6–20)
CO2: 22 mmol/L (ref 22–32)
Calcium: 9.3 mg/dL (ref 8.9–10.3)
Chloride: 105 mmol/L (ref 98–111)
Creatinine, Ser: 2.03 mg/dL — ABNORMAL HIGH (ref 0.61–1.24)
GFR, Estimated: 37 mL/min — ABNORMAL LOW (ref >60)
Glucose, Bld: 111 mg/dL — ABNORMAL HIGH (ref 70–99)
Potassium: 4.3 mmol/L (ref 3.5–5.1)
Sodium: 138 mmol/L (ref 135–145)

## 2022-08-18 LAB — D-DIMER, QUANTITATIVE: D-Dimer, Quant: <0.27 ug/mL-FEU (ref 0.00–0.50)

## 2022-08-18 LAB — TROPONIN I (HIGH SENSITIVITY)
Troponin I (High Sensitivity): 9 ng/L (ref <18)
Troponin I (High Sensitivity): 7 ng/L (ref <18)

## 2022-08-18 MED ORDER — CYCLOBENZAPRINE HCL 10 MG PO TABS
10.0000 mg | ORAL_TABLET | Freq: Two times a day (BID) | ORAL | 0 refills | Status: DC | PRN
  Filled 2022-08-18 – 2022-08-19 (×2): qty 20, 10d supply, fill #0

## 2022-08-18 MED ORDER — IOHEXOL 350 MG/ML SOLN
120.0000 mL | Freq: Once | INTRAVENOUS | Status: AC | PRN
  Administered 2022-08-18: 120 mL via INTRAVENOUS

## 2022-08-18 NOTE — Patient Instructions (Signed)
Please go to the emergency room.

## 2022-08-18 NOTE — ED Notes (Signed)
Patient transported to CT 

## 2022-08-18 NOTE — Discharge Instructions (Signed)
Continue outpatient workup with your cardiology team.  Call them to make sure that they have scheduled your stress test outpatient.  I recommend continued use of Tylenol for discomfort.  You can consider using Flexeril as a muscle relaxant as well.  Follow-up with your primary care doctor.

## 2022-08-18 NOTE — ED Triage Notes (Signed)
Pt arrived POV. Ongoing CP x couple weeks that started in his L Upper back which has progressed to his L chest w/ radiation to L arm. Cardiologist sent pt here today d/t new BBB on pt's EKG.

## 2022-08-18 NOTE — ED Provider Notes (Addendum)
[facility] AT [facility] Provider Note   CSN: [id number] Arrival date & time: 08/18/22  1542     History  Chief Complaint  Patient presents with   Chest Pain    Darryl Black is a 57 y.o. male.  Patient sent here from cardiologist office for chest pain workup history of high cholesterol, HIV, DVT, high cholesterol.  He has been having chest pain with exertion for the last 2 weeks.  He works out often and he is out of the last 2 weeks that he is getting worsening chest pain when he is working out.  He it started with some upper back pain prior to that.  He had an abnormal EKG and was sent for blood work.  Never had a heart attack before.  No family history of heart attack.  He had a DVT after marathon a while back but he is on anticoagulation.  The history is provided by the patient.       Home Medications Prior to Admission medications   Medication Sig Start Date End Date Taking? Authorizing Provider  cyclobenzaprine (FLEXERIL) 10 MG tablet Take 1 tablet (10 mg total) by mouth 2 (two) times daily as needed for muscle spasms. 08/18/22  Yes Sherley Mckenney, DO  bictegravir-emtricitabine-tenofovir AF (BIKTARVY) 50-200-25 MG TABS tablet Take 1 tablet by mouth daily. 11/15/21   Kuppelweiser, Cassie L, RPH-CPP  cholecalciferol (VITAMIN D) 1000 UNITS tablet Take 1,000 Units by mouth daily.    [provider]  fenofibrate 160 MG tablet TAKE 1 TABLET BY MOUTH ONCE A DAY 10/20/21 10/25/22  Ronnald Nian, MD  icosapent Ethyl (VASCEPA) 1 g capsule Take 2 capsules (2 g total) by mouth 2 (two) times daily. 10/20/21   Ronnald Nian, MD  losartan (COZAAR) 50 MG tablet Take 1 tablet (50 mg total) by mouth daily. 08/02/22   Ronnald Nian, MD  Multiple Vitamin (MULTIVITAMIN WITH MINERALS) TABS Take 1 tablet by mouth daily.    [provider]  pantoprazole (PROTONIX) 40 MG tablet Take 1 tablet (40 mg total) by mouth daily. 06/22/22   Ronnald Nian, MD   rosuvastatin (CRESTOR) 5 MG tablet TAKE 1 TABLET BY MOUTH DAILY 10/20/21   Ronnald Nian, MD  valACYclovir (VALTREX) 1000 MG tablet Take 1 tablet (1,000 mg total) by mouth daily. 07/20/22   Ronnald Nian, MD  zolpidem (AMBIEN) 10 MG tablet Take 1/2 to 1 tablet (5-10 mg total) by mouth at bedtime as needed for sleep 05/18/22   Ronnald Nian, MD      Allergies    Patient has no known allergies.    Review of Systems   Review of Systems  Physical Exam Updated Vital Signs BP (!) 152/98   Pulse 65   Temp 98 F (36.7 C)   Resp 13   Ht 5\' 9"  (1.753 m)   Wt 83.5 kg   SpO2 100%   BMI 27.17 kg/m  Physical Exam Vitals and nursing note reviewed.  Constitutional:      General: He is not in acute distress.    Appearance: He is well-developed. He is not ill-appearing.  HENT:     Head: Normocephalic and atraumatic.  Eyes:     Extraocular Movements: Extraocular movements intact.     Conjunctiva/sclera: Conjunctivae normal.     Pupils: Pupils are equal, round, and reactive to light.  Cardiovascular:     Rate and Rhythm: Normal rate and regular rhythm.  Pulses:          Radial pulses are 2+ on the right side and 2+ on the left side.     Heart sounds: No murmur heard. Pulmonary:     Effort: Pulmonary effort is normal. No respiratory distress.     Breath sounds: Normal breath sounds.  Abdominal:     Palpations: Abdomen is soft.     Tenderness: There is no abdominal tenderness.  Musculoskeletal:        General: No swelling. Normal range of motion.     Cervical back: Normal range of motion and neck supple.     Right lower leg: No edema.     Left lower leg: No edema.  Skin:    General: Skin is warm and dry.     Capillary Refill: Capillary refill takes less than 2 seconds.  Neurological:     Mental Status: He is alert.  Psychiatric:        Mood and Affect: Mood normal.     ED Results / Procedures / Treatments   Labs (all labs ordered are listed, but only abnormal results  are displayed) Labs Reviewed  BASIC METABOLIC PANEL - Abnormal; Notable for the following components:      Result Value   Glucose, Bld 111 (*)    Creatinine, Ser 2.03 (*)    GFR, Estimated 37 (*)    All other components within normal limits  CBC - Abnormal; Notable for the following components:   MCH 35.0 (*)    All other components within normal limits  D-DIMER, QUANTITATIVE  TROPONIN I (HIGH SENSITIVITY)  TROPONIN I (HIGH SENSITIVITY)    EKG None  Radiology CT ANGIO CHEST AORTA W/ & OR WO/CM & GATING ([place] ONLY)  Result Date: 08/18/2022 CLINICAL DATA:  Acute aortic syndrome suspected. Ongoing chest pain for a couple of weeks extending to the left chest and left arm. EXAM: CT ANGIOGRAPHY CHEST, ABDOMEN AND PELVIS TECHNIQUE: Non-contrast CT of the chest was initially obtained. Multidetector CT imaging through the chest, abdomen and pelvis was performed using the standard protocol during bolus administration of intravenous contrast. Multiplanar reconstructed images and MIPs were obtained and reviewed to evaluate the vascular anatomy. RADIATION DOSE REDUCTION: This exam was performed according to the departmental dose-optimization program which includes automated exposure control, adjustment of the mA and/or kV according to patient size and/or use of iterative reconstruction technique. CONTRAST:  OMNIPAQUE IOHEXOL 350 MG/ML SOLN COMPARISON:  None Available. FINDINGS: CTA CHEST FINDINGS Cardiovascular: The heart is normal in size and there is no pericardial effusion. There is a minimal atherosclerotic calcification of the aorta without evidence of aneurysm or dissection. The pulmonary trunk is normal in caliber. The aortic arch has a normal three-vessel configuration. Mediastinum/Nodes: No enlarged mediastinal, hilar, or axillary lymph nodes. Thyroid gland, trachea, and esophagus demonstrate no significant findings. Lungs/Pleura: Mild atelectasis is noted bilaterally. No effusion or  pneumothorax. A 3 mm nodule is noted in the right lower lobe, axial image 217. Musculoskeletal: Mild degenerative changes in the thoracic spine. No acute osseous abnormality. Review of the MIP images confirms the above findings. CTA ABDOMEN AND PELVIS FINDINGS VASCULAR Aorta: Normal caliber aorta without aneurysm, dissection, vasculitis or significant stenosis. Celiac: Patent without evidence of aneurysm, dissection, vasculitis or significant stenosis. SMA: Patent without evidence of aneurysm, dissection, vasculitis or significant stenosis. Renals: Both renal arteries are patent without evidence of aneurysm, dissection, vasculitis, fibromuscular dysplasia or significant stenosis. An accessory renal artery is noted on the  right. IMA: Patent without evidence of aneurysm, dissection, vasculitis or significant stenosis. Inflow: Patent without evidence of aneurysm, dissection, vasculitis or significant stenosis. Veins: No obvious venous abnormality within the limitations of this arterial phase study. Review of the MIP images confirms the above findings. NON-VASCULAR Hepatobiliary: No focal liver abnormality is seen. Fatty infiltration of the liver is noted. No gallstones, gallbladder wall thickening, or biliary dilatation. Pancreas: Unremarkable. No pancreatic ductal dilatation or surrounding inflammatory changes. Spleen: Normal in size without focal abnormality. Adrenals/Urinary Tract: The adrenal glands are within normal limits. The kidneys enhance symmetrically. Subcentimeter hypodensities are present in the kidneys bilaterally which are too small to further characterize. Mild perinephric edema is noted bilaterally. No renal calculus or hydronephrosis. The bladder is unremarkable. Stomach/Bowel: Stomach is within normal limits. Appendix is not seen. No evidence of bowel wall thickening, distention, or inflammatory changes. No free air or pneumatosis. Lymphatic: No abdominal or pelvic lymphadenopathy. Reproductive:  Prostate gland is enlarged. Other: No abdominopelvic ascites. A small fat containing umbilical hernia is present. Musculoskeletal: Degenerative changes are present in the lumbar spine. No acute osseous abnormality. Review of the MIP images confirms the above findings. IMPRESSION: 1. No evidence of aortic aneurysm or dissection. 2. No acute process in the chest, abdomen, or pelvis. 3. 3 mm right lower lobe pulmonary nodule. No follow-up needed if patient is low-risk.This recommendation follows the consensus statement: Guidelines for Management of Incidental Pulmonary Nodules Detected on CT Images: From the Fleischner Society 2017; Radiology 2017; 284:228-243. 4. Hepatic steatosis. 5. Enlarged prostate gland. Electronically Signed   By: Thornell Sartorius M.D.   On: 08/18/2022 22:38   CT Angio Abd/Pel w/ and/or w/o  Result Date: 08/18/2022 CLINICAL DATA:  Acute aortic syndrome suspected. Ongoing chest pain for a couple of weeks extending to the left chest and left arm. EXAM: CT ANGIOGRAPHY CHEST, ABDOMEN AND PELVIS TECHNIQUE: Non-contrast CT of the chest was initially obtained. Multidetector CT imaging through the chest, abdomen and pelvis was performed using the standard protocol during bolus administration of intravenous contrast. Multiplanar reconstructed images and MIPs were obtained and reviewed to evaluate the vascular anatomy. RADIATION DOSE REDUCTION: This exam was performed according to the departmental dose-optimization program which includes automated exposure control, adjustment of the mA and/or kV according to patient size and/or use of iterative reconstruction technique. CONTRAST:  OMNIPAQUE IOHEXOL 350 MG/ML SOLN COMPARISON:  None Available. FINDINGS: CTA CHEST FINDINGS Cardiovascular: The heart is normal in size and there is no pericardial effusion. There is a minimal atherosclerotic calcification of the aorta without evidence of aneurysm or dissection. The pulmonary trunk is normal in caliber.  The aortic arch has a normal three-vessel configuration. Mediastinum/Nodes: No enlarged mediastinal, hilar, or axillary lymph nodes. Thyroid gland, trachea, and esophagus demonstrate no significant findings. Lungs/Pleura: Mild atelectasis is noted bilaterally. No effusion or pneumothorax. A 3 mm nodule is noted in the right lower lobe, axial image 217. Musculoskeletal: Mild degenerative changes in the thoracic spine. No acute osseous abnormality. Review of the MIP images confirms the above findings. CTA ABDOMEN AND PELVIS FINDINGS VASCULAR Aorta: Normal caliber aorta without aneurysm, dissection, vasculitis or significant stenosis. Celiac: Patent without evidence of aneurysm, dissection, vasculitis or significant stenosis. SMA: Patent without evidence of aneurysm, dissection, vasculitis or significant stenosis. Renals: Both renal arteries are patent without evidence of aneurysm, dissection, vasculitis, fibromuscular dysplasia or significant stenosis. An accessory renal artery is noted on the right. IMA: Patent without evidence of aneurysm, dissection, vasculitis or significant stenosis. Inflow: Patent without  evidence of aneurysm, dissection, vasculitis or significant stenosis. Veins: No obvious venous abnormality within the limitations of this arterial phase study. Review of the MIP images confirms the above findings. NON-VASCULAR Hepatobiliary: No focal liver abnormality is seen. Fatty infiltration of the liver is noted. No gallstones, gallbladder wall thickening, or biliary dilatation. Pancreas: Unremarkable. No pancreatic ductal dilatation or surrounding inflammatory changes. Spleen: Normal in size without focal abnormality. Adrenals/Urinary Tract: The adrenal glands are within normal limits. The kidneys enhance symmetrically. Subcentimeter hypodensities are present in the kidneys bilaterally which are too small to further characterize. Mild perinephric edema is noted bilaterally. No renal calculus or  hydronephrosis. The bladder is unremarkable. Stomach/Bowel: Stomach is within normal limits. Appendix is not seen. No evidence of bowel wall thickening, distention, or inflammatory changes. No free air or pneumatosis. Lymphatic: No abdominal or pelvic lymphadenopathy. Reproductive: Prostate gland is enlarged. Other: No abdominopelvic ascites. A small fat containing umbilical hernia is present. Musculoskeletal: Degenerative changes are present in the lumbar spine. No acute osseous abnormality. Review of the MIP images confirms the above findings. IMPRESSION: 1. No evidence of aortic aneurysm or dissection. 2. No acute process in the chest, abdomen, or pelvis. 3. 3 mm right lower lobe pulmonary nodule. No follow-up needed if patient is low-risk.This recommendation follows the consensus statement: Guidelines for Management of Incidental Pulmonary Nodules Detected on CT Images: From the Fleischner Society 2017; Radiology 2017; 284:228-243. 4. Hepatic steatosis. 5. Enlarged prostate gland. Electronically Signed   By: Thornell Sartorius M.D.   On: 08/18/2022 22:38   DG Chest 2 View  Result Date: 08/18/2022 CLINICAL DATA:  Provided history: Chest pain. EXAM: CHEST - 2 VIEW COMPARISON:  Prior chest radiographs 04/21/2018 FINDINGS: Heart size within normal limits. Aortic atherosclerosis. No appreciable airspace consolidation. No evidence of pleural effusion or pneumothorax. No acute osseous abnormality identified. IMPRESSION: 1. No evidence of an acute cardiopulmonary abnormality. 2. Aortic Atherosclerosis (ICD10-I70.0). Electronically Signed   By: Jackey Loge D.O.   On: 08/18/2022 16:45    Procedures Procedures    Medications Ordered in ED Medications  iohexol (OMNIPAQUE) 350 MG/ML injection 120 mL (120 mLs Intravenous Contrast Given 08/18/22 2213)    ED Course/ Medical Decision Making/ A&P                             Medical Decision Making Amount and/or Complexity of Data Reviewed Labs:  ordered. Radiology: ordered.  Risk Prescription drug management.   Darryl Black is here with chest pain.  History of HIV, high cholesterol, DVT not on anticoagulation.  He has been having exertional chest pain for the last 2 weeks.  Works out pretty vigorously.  But started develop some upper back pain and now some chest pain associated with working out as well.  Does not seem reproducible.  He had EKG that showed bundle branch but no obvious ischemic changes at cardiology office.  He has some cardiac risk factors but otherwise differential diagnosis could be PE versus ACS versus musculoskeletal process.  Does not sound like he has any infectious symptoms.  Decision was made to get CBC BMP troponin, D-dimer, chest x-ray.  Per my review and interpretation of those labs troponin negative x 2.  D-dimer normal.  No significant anemia or electrolyte abnormality or kidney injury.  Chest x-ray showed no evidence of pneumonia or pneumothorax. Cr is around baseline but does have CKD, however given that this is CKD and not an acute kidney  injury should be safe for contrast, decision was made to get dissection study given that he is having pain into his back as well and this was unremarkable except for an incidental pulmonary nodule.  I have consulted cardiology for further recommendations.   Cardiology overall okay with continued outpatient workup.  Seems like may be muscular process going on after workup completed.  He has physical therapy referral next week for this.  I will prescribe him Flexeril.  He will pursue outpatient stress test per cardiology recommendations.  Patient discharged in good condition.   This chart was dictated using voice recognition software.  Despite best efforts to proofread,  errors can occur which can change the documentation meaning.          Final Clinical Impression(s) / ED Diagnoses Final diagnoses:  Nonspecific chest pain    Rx / DC Orders ED Discharge Orders           Ordered    cyclobenzaprine (FLEXERIL) 10 MG tablet  2 times daily PRN        08/18/22 2309              Virgina Norfolk, DO 08/18/22 2311    Virgina Norfolk, DO 08/18/22 2335

## 2022-08-18 NOTE — Consult Note (Signed)
Cardiology Consultation   Patient ID: NOLAWI KANADY MRN: [id number]; DOB: 1965/11/28  Admit date: 08/18/2022 Date of Consult: 08/18/2022  PCP:  Ronnald Nian, MD   [facility] Providers Cardiologist:  Donato Schultz, MD        Patient Profile:   Darryl Black is a 57 y.o. male with a hx of HIV (on Biktarvy with absolute CD4 of 1417 and HIV viral load <20) , SVT, HLD, DVT who is being seen 08/18/2022 for the evaluation of chest pain at the request of ER.  History of Present Illness:   Darryl Black was seen earlier today in clinic with complains of retrosternal chest pain. Patient states that he symptoms began in the upper back 2 weeks ago. He told this to his PCP and he was sent to PT. A few days after, his pain localized on his left side of the chest with concomitant symptoms in his left arm intermittently. The pain has been constant for 2 weeks without relievers. He denies periods without pain. He feels like during specific exercises (shoulder press or bench press) the pain gets worse. This does not happen while running. He denies concomitant shortness of breath and diaphoresis. In the clinic, ECG was obtained and he was noted to have a new LBBB. Because of his symptoms and new LBBB on ECG he was sent to the ER for evaluation. Per outpatient notes, he suffered from CP in the past and was supposed to get a CCTA (later changed rto a Myoview due to creatinine). Ultimately the pain was attributed to GI issues and ischemic eval was not pursued.  On arrival, his BP was 143/103 mmHg, HR 70, RR 16. D dimer -ve, two sets of troponins -ve, Hb 16, plt 216, Cr 2 (from a baseline of 1.5). Repeat ECG in the ER showed LBBB. CXR w/p cardiopulmonary abnormalities. Had a negative CTA for dissection as well.  Past Medical History:  Diagnosis Date   DVT (deep venous thrombosis) (HCC)    after half marathon    Dyslipidemia    GERD (gastroesophageal reflux disease)    Gilbert's disease     HH (hiatus hernia)    HIV positive (HCC) 92   Hyperlipidemia    Renal stone     Past Surgical History:  Procedure Laterality Date   APPENDECTOMY     COLONOSCOPY     last 10 years + ago 2002 in epic- colon all normal   SKIN BIOPSY Right 03/14/2018   seborrheic keratosis inflamed   UPPER GASTROINTESTINAL ENDOSCOPY       Home Medications:  Prior to Admission medications   Medication Sig Start Date End Date Taking? Authorizing Provider  bictegravir-emtricitabine-tenofovir AF (BIKTARVY) 50-200-25 MG TABS tablet Take 1 tablet by mouth daily. 11/15/21   Kuppelweiser, Cassie L, RPH-CPP  cholecalciferol (VITAMIN D) 1000 UNITS tablet Take 1,000 Units by mouth daily.    [provider]  fenofibrate 160 MG tablet TAKE 1 TABLET BY MOUTH ONCE A DAY 10/20/21 10/25/22  Ronnald Nian, MD  icosapent Ethyl (VASCEPA) 1 g capsule Take 2 capsules (2 g total) by mouth 2 (two) times daily. 10/20/21   Ronnald Nian, MD  losartan (COZAAR) 50 MG tablet Take 1 tablet (50 mg total) by mouth daily. 08/02/22   Ronnald Nian, MD  Multiple Vitamin (MULTIVITAMIN WITH MINERALS) TABS Take 1 tablet by mouth daily.    [provider]  pantoprazole (PROTONIX) 40 MG tablet Take 1 tablet (40  mg total) by mouth daily. 06/22/22   Ronnald Nian, MD  rosuvastatin (CRESTOR) 5 MG tablet TAKE 1 TABLET BY MOUTH DAILY 10/20/21   Ronnald Nian, MD  valACYclovir (VALTREX) 1000 MG tablet Take 1 tablet (1,000 mg total) by mouth daily. 07/20/22   Ronnald Nian, MD  zolpidem (AMBIEN) 10 MG tablet Take 1/2 to 1 tablet (5-10 mg total) by mouth at bedtime as needed for sleep 05/18/22   Ronnald Nian, MD    Inpatient Medications: Scheduled Meds:  Continuous Infusions:  PRN Meds:   Allergies:   No Known Allergies  Social History:   Social History   Socioeconomic History   Marital status: Married    Spouse name: Not on file   Number of children: Not on file   Years of education: Not on file   Highest  education level: Not on file  Occupational History   Occupation: Mental Health Counselor    Employer:  [employer]  Tobacco Use   Smoking status: Never   Smokeless tobacco: Never  Substance and Sexual Activity   Alcohol use: Never   Drug use: Never   Sexual activity: Yes  Other Topics Concern   Not on file  Social History Narrative   Not on file   Social Determinants of Health   Financial Resource Strain: Not on file  Food Insecurity: Not on file  Transportation Needs: Not on file  Physical Activity: Not on file  Stress: Not on file  Social Connections: Not on file  Intimate Partner Violence: Not on file    Family History:   Family History  Problem Relation Age of Onset   Pancreatic cancer Mother    Diabetes Father    Heart disease Maternal Grandfather    Heart disease Paternal Grandfather    Colon cancer Neg Hx    Colon polyps Neg Hx    Esophageal cancer Neg Hx    Rectal cancer Neg Hx    Stomach cancer Neg Hx      ROS:  Please see the history of present illness.  All other ROS reviewed and negative.     Physical Exam/Data:   Vitals:   08/18/22 1619 08/18/22 2000 08/18/22 2025 08/18/22 2120  BP:  (!) 143/103 (!) 144/105 (!) 152/98  Pulse:  64 (!) 57 65  Resp:  13 13 13   Temp:  98 F (36.7 C)    SpO2:  100% 100% 100%  Weight: 83.5 kg     Height: 5\' 9"  (1.753 m)      No intake or output data in the 24 hours ending 08/18/22 2202    08/18/2022    4:19 PM 08/18/2022    2:44 PM 08/02/2022   11:44 AM  Last 3 Weights  Weight (lbs) 184 lb 186 lb 9.6 oz 188 lb  Weight (kg) 83.462 kg 84.641 kg 85.276 kg     Body mass index is 27.17 kg/m.  General:  Well nourished, well developed, in no acute distress HEENT: normal Neck: no JVD Vascular: No carotid bruits; Distal pulses 2+ bilaterally Cardiac:  normal S1, S2; RRR; no murmur  Lungs:  clear to auscultation bilaterally, no wheezing, rhonchi or rales  Abd: soft, nontender, no hepatomegaly  Ext: no  edema Musculoskeletal:  No deformities, BUE and BLE strength normal and equal Skin: warm and dry  Neuro:  CNs 2-12 intact, no focal abnormalities noted Psych:  Normal affect   EKG:  The EKG was personally reviewed and demonstrates:  NSR, LBBB Telemetry:  Telemetry was personally reviewed and demonstrates:  NSR, LBBB  Laboratory Data:  High Sensitivity Troponin:   Recent Labs  Lab 08/18/22 1631 08/18/22 1849  TROPONINIHS 9 7     Chemistry Recent Labs  Lab 08/18/22 1631  NA 138  K 4.3  CL 105  CO2 22  GLUCOSE 111*  BUN 18  CREATININE 2.03*  CALCIUM 9.3  GFRNONAA 37*  ANIONGAP 11    No results for input(s): "PROT", "ALBUMIN", "AST", "ALT", "ALKPHOS", "BILITOT" in the last 168 hours. Lipids No results for input(s): "CHOL", "TRIG", "HDL", "LABVLDL", "LDLCALC", "CHOLHDL" in the last 168 hours.  Hematology Recent Labs  Lab 08/18/22 1631  WBC 8.2  RBC 4.68  HGB 16.4  HCT 46.0  MCV 98.3  MCH 35.0*  MCHC 35.7  RDW 12.2  PLT 216   Thyroid No results for input(s): "TSH", "FREET4" in the last 168 hours.  BNPNo results for input(s): "BNP", "PROBNP" in the last 168 hours.  DDimer  Recent Labs  Lab 08/18/22 2038  DDIMER <0.27     Radiology/Studies:  DG Chest 2 View  Result Date: 08/18/2022 CLINICAL DATA:  Provided history: Chest pain. EXAM: CHEST - 2 VIEW COMPARISON:  Prior chest radiographs 04/21/2018 FINDINGS: Heart size within normal limits. Aortic atherosclerosis. No appreciable airspace consolidation. No evidence of pleural effusion or pneumothorax. No acute osseous abnormality identified. IMPRESSION: 1. No evidence of an acute cardiopulmonary abnormality. 2. Aortic Atherosclerosis (ICD10-I70.0). Electronically Signed   By: Jackey Loge D.O.   On: 08/18/2022 16:45     Assessment and Plan:   57 y/o with hx of HIV (on Biktarvy with absolute CD4 of 1417 and HIV viral load <20) , SVT, HLD, DVT. Patient's description of the pain does not sound cardiac. It is  unlikely that he would be having chest pain of 2 week duration without positive enzymes. Additionally his pain is only exacerbated by press exercises. The pain is always in the background and does not prevent him from doing his usual activities. He was hypertensive on arrival with worsening renal function. His ECG confirmed LBBB. Pain is not reproducible on palpation. He still has a 3-4/10 pain currently.  Non cardiac chest pain AKI on CKD HIV on Biktarvy HLD DVT  Plan: - Recommend admission to medicine for further workup of chest pain - His CT of the chest did not show acute aortic syndrome. Minimal amount of calcification in non contrast portion of the exam. Given that is not a dedicated coronary CTA, the visualization of the coronaries is not ideal - IV fluids given contrast load  - We could consider outpatient lexiscan stress test (given presence of LBBB) for further reassurance but his pain does not sound cardiac in nature.   Risk Assessment/Risk Scores:                For questions or updates, please contact [facility] Please consult [email or web address] for contact info under    Signed, Regan Rakers, MD  08/18/2022 10:02 PM

## 2022-08-19 ENCOUNTER — Other Ambulatory Visit (HOSPITAL_COMMUNITY): Payer: Self-pay

## 2022-08-21 ENCOUNTER — Other Ambulatory Visit (HOSPITAL_COMMUNITY): Payer: Self-pay

## 2022-08-21 ENCOUNTER — Telehealth: Payer: Self-pay | Admitting: Cardiology

## 2022-08-21 DIAGNOSIS — R079 Chest pain, unspecified: Secondary | ICD-10-CM

## 2022-08-21 NOTE — Telephone Encounter (Signed)
Pt called in stating following his ED visit last Friday, they suggested he have a stress test done. Pt unsure if he should wait until his appt for 08/30/22 or get it ordered now. Please advise.

## 2022-08-21 NOTE — Telephone Encounter (Signed)
Will have Dr Anne Fu review ED notes/testing for any possible orders.

## 2022-08-22 NOTE — Telephone Encounter (Signed)
Order NUC stress - chest pain Thanks Donato Schultz, MD    Myoview ordered.  Instructions sent to pt via MyChart.

## 2022-08-22 NOTE — Telephone Encounter (Signed)
Pt has been scheduled for 08/25/2022.

## 2022-08-22 NOTE — Telephone Encounter (Signed)
Left message on pt's VM to call Dr Anne Fu' office.

## 2022-08-23 ENCOUNTER — Ambulatory Visit (HOSPITAL_BASED_OUTPATIENT_CLINIC_OR_DEPARTMENT_OTHER): Payer: 59 | Attending: Family Medicine | Admitting: Physical Therapy

## 2022-08-23 ENCOUNTER — Encounter (HOSPITAL_BASED_OUTPATIENT_CLINIC_OR_DEPARTMENT_OTHER): Payer: Self-pay | Admitting: Physical Therapy

## 2022-08-23 ENCOUNTER — Other Ambulatory Visit: Payer: Self-pay

## 2022-08-23 DIAGNOSIS — M62830 Muscle spasm of back: Secondary | ICD-10-CM | POA: Diagnosis not present

## 2022-08-23 DIAGNOSIS — M546 Pain in thoracic spine: Secondary | ICD-10-CM | POA: Insufficient documentation

## 2022-08-23 DIAGNOSIS — M62838 Other muscle spasm: Secondary | ICD-10-CM | POA: Insufficient documentation

## 2022-08-23 DIAGNOSIS — M6281 Muscle weakness (generalized): Secondary | ICD-10-CM | POA: Insufficient documentation

## 2022-08-23 NOTE — Therapy (Signed)
OUTPATIENT PHYSICAL THERAPY CERVICAL EVALUATION   Patient Name: Darryl Black MRN: [id number] DOB:04-10-1965, 57 y.o., male Today's Date: 08/24/2022  END OF SESSION:  PT End of Session - 08/23/22 1314     Visit Number 1    Number of Visits 8    Date for PT Re-Evaluation 10/18/22    PT Start Time 1300    PT Stop Time 1343    PT Time Calculation (min) 43 min    Activity Tolerance Patient tolerated treatment well    Behavior During Therapy WFL for tasks assessed/performed             Past Medical History:  Diagnosis Date   DVT (deep venous thrombosis) (HCC)    after half marathon    Dyslipidemia    GERD (gastroesophageal reflux disease)    Gilbert's disease    HH (hiatus hernia)    HIV positive (HCC) 92   Hyperlipidemia    Renal stone    Past Surgical History:  Procedure Laterality Date   APPENDECTOMY     COLONOSCOPY     last 10 years + ago 2002 in epic- colon all normal   SKIN BIOPSY Right 03/14/2018   seborrheic keratosis inflamed   UPPER GASTROINTESTINAL ENDOSCOPY     Patient Active Problem List   Diagnosis Date Noted   Essential hypertension 03/24/2021   Aortic atherosclerosis (HCC) 04/23/2019   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) 01/31/2019   Stage 3a chronic kidney disease (HCC) 01/30/2019   History of esophageal stricture 06/15/2015   History of herpes genitalis 12/01/2014   H/O herpes labialis 12/01/2014   Gilbert's disease 06/03/2014   HH (hiatus hernia) 04/15/2012   History of renal stone 04/15/2012   Hyperlipidemia 04/11/2011   HIV disease (HCC) 04/11/2011    PCP: Dr Sharlot Gowda   REFERRING PROVIDER: Dr Sharlot Gowda   REFERRING DIAG:   THERAPY DIAG:  Other muscle spasm  Pain in thoracic spine  Muscle weakness (generalized)  Rationale for Evaluation and Treatment: Rehabilitation  ONSET DATE:   SUBJECTIVE:                                                                                                                                                                                                          SUBJECTIVE STATEMENT: Patient reports several months ago he began having upper trap pain on the left side.  He has no prior history of such issue.  He he had an insidious onset.  The pain is progressed he is now having pain into his rib  cage and pain in his trap at times.  He likes to workout frequently.  He notices more pain when he uses his arm for things like driving and endurance activities.  He has been cleared by cardiologist.  They believe this more of a musculoskeletal issue. Hand dominance: Right  PERTINENT HISTORY:  History of DVT several years ago, Gilbert's disease, HIV  PAIN:  Are you having pain? Yes: NPRS scale: 1-2/10 ache on the left side of the back  Pain location: Left upper trap/ mid back/ chest pain  Pain description: aching  Aggravating factors: hold his arm out  Relieving factors:   PRECAUTIONS: None  WEIGHT BEARING RESTRICTIONS: No  FALLS:  Has patient fallen in last 6 months?   LIVING ENVIRONMENT: OCCUPATION:  Metal health counselor   Recreation:  Work out regularly; reading     PLOF: Independent  PATIENT GOALS:    NEXT MD VISIT:   OBJECTIVE:   DIAGNOSTIC FINDINGS:  Cardiac CT showed mild degeneration in the thoracic spine  PATIENT SURVEYS:  FOTO    COGNITION: Overall cognitive status: Within functional limits for tasks assessed  SENSATION: WFL  POSTURE: No Significant postural limitations  PALPATION: No significant tenderness to palpation in the upper trap and levator area.  Patient reports when he is sore he does have pain and tenderness in this area.  Moderate soreness to palpation in the mid thoracic area around T6-7 and 8.   CERVICAL ROM:   Active ROM A/PROM (deg) eval  Flexion Full  Extension Full  Right lateral flexion Within normal limits  Left lateral flexion Painful  Right rotation 60  Left rotation 60   (Blank rows = not  tested)  UPPER EXTREMITY ROM:  Active ROM Right eval Left eval  Shoulder flexion Full Full  Shoulder extension    Shoulder abduction    Shoulder adduction    Shoulder extension    Shoulder internal rotation Full Painful  Shoulder external rotation Full Full  Elbow flexion    Elbow extension    Wrist flexion    Wrist extension    Wrist ulnar deviation    Wrist radial deviation    Wrist pronation    Wrist supination     (Blank rows = not tested)  UPPER EXTREMITY MMT:  MMT Right eval Left eval  Shoulder flexion 9.9 8.5  Shoulder extension    Shoulder abduction    Shoulder adduction    Shoulder extension    Shoulder internal rotation 19.0 11.4  Shoulder external rotation 14.5 14.1  Middle trapezius    Lower trapezius    Elbow flexion    Elbow extension 20.6 11.0  Wrist flexion    Wrist extension    Wrist ulnar deviation    Wrist radial deviation    Wrist pronation    Wrist supination    Grip strength 90 60   (Blank rows = not tested)  CERVICAL SPECIAL TESTS:  Left spurlings into the mid back  FUNCTIONAL TESTS:    TODAY'S TREATMENT:  DATE:   Access Code: [email or web address] URL: [email or web address] Date: 08/23/2022 Prepared by: Lorayne Bender  Exercises - Theracane Over Shoulder  - 1 x daily - 7 x weekly - 3 sets - 10 reps - Seated Mid Back Stretch  - 1 x daily - 7 x weekly - 3 sets - 3 reps - 15-20 sec  hold - Gentle Levator Scapulae Stretch  - 1 x daily - 7 x weekly - 3 sets - 3 reps - 20  hold - Seated Upper Trapezius Stretch  - 1 x daily - 7 x weekly - 3 sets - 3 reps - 20 sec  hold - Thoracic Extension Mobilization on Foam Roll  - 1 x daily - 7 x weekly - 1 sets - 10 reps - Doorway Pec Stretch at 90 Degrees Abduction  - 1 x daily - 7 x weekly - 3 sets - 10 reps  PATIENT EDUCATION:  Education details: HEP, symptom  mangement  Person educated: Patient Education method: Explanation, Demonstration, Tactile cues, Verbal cues, and Handouts Education comprehension: verbalized understanding, returned demonstration, verbal cues required, tactile cues required, and needs further education  HOME EXERCISE PROGRAM: Access Code: [email or web address] URL: [email or web address] Date: 08/23/2022 Prepared by: Lorayne Bender  Exercises - Theracane Over Shoulder  - 1 x daily - 7 x weekly - 3 sets - 10 reps - Seated Mid Back Stretch  - 1 x daily - 7 x weekly - 3 sets - 3 reps - 15-20 sec  hold - Gentle Levator Scapulae Stretch  - 1 x daily - 7 x weekly - 3 sets - 3 reps - 20  hold - Seated Upper Trapezius Stretch  - 1 x daily - 7 x weekly - 3 sets - 3 reps - 20 sec  hold - Thoracic Extension Mobilization on Foam Roll  - 1 x daily - 7 x weekly - 1 sets - 10 reps - Doorway Pec Stretch at 90 Degrees Abduction  - 1 x daily - 7 x weekly - 3 sets - 10 reps  ASSESSMENT:  CLINICAL IMPRESSION: Patient is a 56 year old male who presents with left upper trap pain and tightness as well as rib cage pain that goes into his anterior chest.  He has tenderness to palpation in his mid thoracic area.  The patient radiating pain along his rib cage could suggest some type of thoracic disc issue although we cannot reproduce with thoracic motion.  He has mild limitations in cervical rotation.  He has reproduction of back and pain radiating into his shoulder blade with cervical left sidebending.  He has some tightness in his upper trap and levator musculature but no significant trigger points at this time.  He does report that when he is hurting it is tender to palpation in that area.  He has increased pain when he has to hold his left arm out for things like driving.  He would benefit from skilled therapy including trigger point dry needling in order to decrease muscle spasming, improve functional endurance of his left arm, and decreased pain in  his posterior shoulder, cervical spine area, and rib cage/chest area. OBJECTIVE IMPAIRMENTS: decreased activity tolerance, decreased endurance, decreased strength, increased muscle spasms, and pain.   ACTIVITY LIMITATIONS: carrying, lifting, and reach over head  PARTICIPATION LIMITATIONS: driving and community activity  PERSONAL FACTORS: None are also affecting patient's functional outcome.   REHAB POTENTIAL: Excellent  CLINICAL DECISION MAKING: Stable/uncomplicated  EVALUATION COMPLEXITY: Low   GOALS: Goals reviewed with patient? Yes  SHORT  TERM GOALS: Target date: 10/05/2022    Patient will increase cervical rotation by 20 degrees Baseline:  Goal status: INITIAL  2.  Patient will increase triceps and shoulder internal rotation strength by 5 pounds Baseline:  Goal status: INITIAL  3.  Patient will reach behind his back with left arm without pain Baseline:  Goal status: INITIAL  LONG TERM GOALS: Target date: 10/05/2022    Patient will drive without pain. Baseline:  Goal status: INITIAL  2.  Patient will perform full gym program without pain Baseline:  Goal status: INITIAL  3.  Patient will have a full soft tissue and stretching program to prevent further exacerbation of symptoms. Baseline:  Goal status: INITIAL   PLAN:  PT FREQUENCY: 1x/week  PT DURATION: 6 weeks  PLANNED INTERVENTIONS: Therapeutic exercises, Therapeutic activity, Neuromuscular re-education, Balance training, Gait training, Patient/Family education, Self Care, Joint mobilization, Aquatic Therapy, Dry Needling, Electrical stimulation, Cryotherapy, Moist heat, and Manual therapy  PLAN FOR NEXT SESSION: Assess tolerance to thoracic extension activities.  Assess self soft tissue mobilization.  Assess HEP for stretching and see which exercises worse at work the best form.  Reviewed gym exercises.  Work on RPE to grade his exercise weights properly.  Consider shoulder endurance exercises including  wall clock and wall walk and ball versus ball.  Trigger point dry needling (HIV+)   Dessie Coma, PT 08/24/2022, 3:07 PM

## 2022-08-24 ENCOUNTER — Encounter (HOSPITAL_BASED_OUTPATIENT_CLINIC_OR_DEPARTMENT_OTHER): Payer: Self-pay | Admitting: Physical Therapy

## 2022-08-24 NOTE — Telephone Encounter (Signed)
Spoke with patient and gave detailed instructions regarding the STRESS TEST on 08/22/22 at 7:45.

## 2022-08-25 ENCOUNTER — Ambulatory Visit (HOSPITAL_COMMUNITY): Payer: 59 | Attending: Internal Medicine

## 2022-08-25 DIAGNOSIS — R079 Chest pain, unspecified: Secondary | ICD-10-CM | POA: Insufficient documentation

## 2022-08-25 LAB — MYOCARDIAL PERFUSION IMAGING
LV dias vol: 111 mL (ref 62–150)
LV sys vol: 67 mL
Nuc Stress EF: 40 %
Peak HR: 108 {beats}/min
Rest HR: 65 {beats}/min
Rest Nuclear Isotope Dose: 10.2 mCi
SDS: 1
SRS: 1
SSS: 2
ST Depression (mm): 0 mm
Stress Nuclear Isotope Dose: 31.7 mCi
TID: 1.04

## 2022-08-25 MED ORDER — TECHNETIUM TC 99M TETROFOSMIN IV KIT
10.2000 | PACK | Freq: Once | INTRAVENOUS | Status: AC | PRN
  Administered 2022-08-25: 10.2 via INTRAVENOUS

## 2022-08-25 MED ORDER — REGADENOSON 0.4 MG/5ML IV SOLN
0.4000 mg | Freq: Once | INTRAVENOUS | Status: AC
  Administered 2022-08-25: 0.4 mg via INTRAVENOUS

## 2022-08-25 MED ORDER — TECHNETIUM TC 99M TETROFOSMIN IV KIT
31.7000 | PACK | Freq: Once | INTRAVENOUS | Status: AC | PRN
  Administered 2022-08-25: 31.7 via INTRAVENOUS

## 2022-08-28 ENCOUNTER — Telehealth: Payer: Self-pay | Admitting: *Deleted

## 2022-08-28 ENCOUNTER — Ambulatory Visit (HOSPITAL_COMMUNITY): Payer: 59 | Attending: Cardiovascular Disease

## 2022-08-28 DIAGNOSIS — I082 Rheumatic disorders of both aortic and tricuspid valves: Secondary | ICD-10-CM | POA: Diagnosis not present

## 2022-08-28 DIAGNOSIS — I10 Essential (primary) hypertension: Secondary | ICD-10-CM | POA: Diagnosis not present

## 2022-08-28 DIAGNOSIS — I517 Cardiomegaly: Secondary | ICD-10-CM

## 2022-08-28 DIAGNOSIS — R079 Chest pain, unspecified: Secondary | ICD-10-CM | POA: Insufficient documentation

## 2022-08-28 LAB — ECHOCARDIOGRAM COMPLETE
Area-P 1/2: 2.62 cm2
Est EF: 55
S' Lateral: 2.7 cm

## 2022-08-28 NOTE — Telephone Encounter (Signed)
Echo scheduled for 08/28/22.

## 2022-08-28 NOTE — Telephone Encounter (Signed)
  Jake Bathe, MD 08/28/2022  6:33 AM EDT     Overall no significant ischemia noted on stress test.  Pump function was calculated lower than previously seen on echocardiogram.  Sometimes this happens on these types of studies and they can be falsely lowered.  Because of this, I would like to go ahead and check an echocardiogram to verify/get another look at overall pump function.    Order placed for echo and pt will be contacted to be scheduled.  Pt has reviewed results and Dr Anne Fu' orders via MyChart.

## 2022-08-29 ENCOUNTER — Other Ambulatory Visit (HOSPITAL_COMMUNITY): Payer: 59

## 2022-08-30 ENCOUNTER — Encounter (HOSPITAL_BASED_OUTPATIENT_CLINIC_OR_DEPARTMENT_OTHER): Payer: Self-pay | Admitting: Cardiology

## 2022-08-30 ENCOUNTER — Ambulatory Visit (HOSPITAL_BASED_OUTPATIENT_CLINIC_OR_DEPARTMENT_OTHER): Payer: 59 | Admitting: Cardiology

## 2022-08-30 VITALS — BP 138/80 | HR 75 | Ht 69.0 in | Wt 183.3 lb

## 2022-08-30 DIAGNOSIS — B20 Human immunodeficiency virus [HIV] disease: Secondary | ICD-10-CM

## 2022-08-30 DIAGNOSIS — E782 Mixed hyperlipidemia: Secondary | ICD-10-CM | POA: Diagnosis not present

## 2022-08-30 DIAGNOSIS — I700 Atherosclerosis of aorta: Secondary | ICD-10-CM | POA: Diagnosis not present

## 2022-08-30 DIAGNOSIS — R072 Precordial pain: Secondary | ICD-10-CM | POA: Diagnosis not present

## 2022-08-30 DIAGNOSIS — N1831 Chronic kidney disease, stage 3a: Secondary | ICD-10-CM | POA: Diagnosis not present

## 2022-08-30 NOTE — Patient Instructions (Signed)
Medication Instructions:  The current medical regimen is effective;  continue present plan and medications.  *If you need a refill on your cardiac medications before your next appointment, please call your pharmacy*  Follow-Up: At [facility], you and your health needs are our priority.  As part of our continuing mission to provide you with exceptional heart care, we have created designated Provider Care Teams.  These Care Teams include your primary Cardiologist (physician) and Advanced Practice Providers (APPs -  Physician Assistants and Nurse Practitioners) who all work together to provide you with the care you need, when you need it.  We recommend signing up for the patient portal called "MyChart".  Sign up information is provided on this After Visit Summary.  MyChart is used to connect with patients for Virtual Visits (Telemedicine).  Patients are able to view lab/test results, encounter notes, upcoming appointments, etc.  Non-urgent messages can be sent to your provider as well.   To learn more about what you can do with MyChart, go to [email or web address].    Your next appointment:   1 year(s)  Provider:   Mark Skains, MD      

## 2022-08-30 NOTE — Progress Notes (Signed)
  Cardiology Office Note:  .   Date:  08/30/2022  ID:  Darryl Black, DOB Apr 07, 1965, MRN [id number] PCP: Ronnald Nian, MD  [facility] Providers Cardiologist:  Donato Schultz, MD    History of Present Illness: .   Darryl Black is a 57 y.o. male here for follow-up of chest discomfort with exertion over about 2 weeks.  Works out often in the last 2 weeks felt worsening chest pain when he reported working out, some radiation or tingling down his left arm and neck.  Upper back pain prior to that.  Was sent to the emergency department last clinic visit because of new left bundle branch block and chest pain.  Workup here unremarkable.  D-dimer normal troponin negative creatinine 2.03 close to baseline.  Chest x-ray no evidence of pneumonia.  Underwent nuclear stress test which showed no significant ischemia however pump function was calculated lower than seen on prior echo.  Echocardiogram subsequently showed normal ejection fraction 55%.  Mild calcification of the aortic valve no stenosis.  Chronic kidney disease stage IIIa Diabetes HIV Hypertension Hyperlipidemia Left bundle branch block newly discovered 07/2022 DVT after marathon several years ago Esophageal stricture-has not needed dilatation in years.  Doing well  At 1 point mild chest discomfort felt to be esophageal stricture.   ROS: no syncope, bleeding, no trouble swallowing  Studies Reviewed: .        Studies as above reviewed Risk Assessment/Calculations:            Physical Exam:   VS:  BP 138/80   Pulse 75   Ht 5\' 9"  (1.753 m)   Wt 183 lb 4.8 oz (83.1 kg)   SpO2 99%   BMI 27.07 kg/m    Wt Readings from Last 3 Encounters:  08/30/22 183 lb 4.8 oz (83.1 kg)  08/25/22 184 lb (83.5 kg)  08/18/22 184 lb (83.5 kg)    GEN: Well nourished, well developed in no acute distress NECK: No JVD; No carotid bruits CARDIAC: RRR, no murmurs, rubs, gallops RESPIRATORY:  Clear to auscultation without rales, wheezing or  rhonchi  ABDOMEN: Soft, non-tender, non-distended EXTREMITIES:  No edema; No deformity   ASSESSMENT AND PLAN: .    Chest pain - Reassuring nuclear stress test with no ischemia, falsely low EF miscalculated by nuclear stress test, echo showed EF of 55% no wall motion.  Overall reassuring workup.  Troponins were normal D-dimer normal as above.  Chest x-ray unremarkable. -After further historical review sounds like radiculopathy.  Having some tingling down his left arm, back of neck.  He is going to see physical therapy for this.  Excellent.  Overall cardiac reassurance has been given.  Hyperlipidemia - Crestor 5 mg fibrate 160.  See above 2 g 2 times a day.  Cardioprotective.  Prior LDL 70 triglycerides 119.  Stage IIIa chronic kidney disease - Trying to avoid CT contrast.  Avoid NSAIDs.  Primary hypertension - Overall well-controlled.  Medications reviewed.  Losartan 50 mg daily.  Occasionally will have a higher blood pressure reading at home.  He notes this take a second reading after sitting.  HIV - Biktarvy, stable  Aortic atherosclerosis - Low-dose Crestor continue       Dispo: 1 year  Signed, Donato Schultz, MD

## 2022-09-06 ENCOUNTER — Encounter (HOSPITAL_BASED_OUTPATIENT_CLINIC_OR_DEPARTMENT_OTHER): Payer: Self-pay | Admitting: Physical Therapy

## 2022-09-06 ENCOUNTER — Ambulatory Visit (HOSPITAL_BASED_OUTPATIENT_CLINIC_OR_DEPARTMENT_OTHER): Payer: 59 | Attending: Family Medicine | Admitting: Physical Therapy

## 2022-09-06 DIAGNOSIS — M546 Pain in thoracic spine: Secondary | ICD-10-CM | POA: Diagnosis not present

## 2022-09-06 DIAGNOSIS — M62838 Other muscle spasm: Secondary | ICD-10-CM | POA: Diagnosis not present

## 2022-09-06 DIAGNOSIS — M6281 Muscle weakness (generalized): Secondary | ICD-10-CM | POA: Diagnosis not present

## 2022-09-06 DIAGNOSIS — C44612 Basal cell carcinoma of skin of right upper limb, including shoulder: Secondary | ICD-10-CM | POA: Diagnosis not present

## 2022-09-06 DIAGNOSIS — D485 Neoplasm of uncertain behavior of skin: Secondary | ICD-10-CM | POA: Diagnosis not present

## 2022-09-06 NOTE — Therapy (Signed)
OUTPATIENT PHYSICAL THERAPY CERVICAL EVALUATION   Patient Name: JESSICA SEIDMAN MRN: [id number] DOB:February 17, 1966, 57 y.o., male Today's Date: 09/06/2022  END OF SESSION:  PT End of Session - 09/06/22 1322     Visit Number 2    Number of Visits 8    Date for PT Re-Evaluation 10/18/22    PT Start Time 0930    PT Stop Time 1014    PT Time Calculation (min) 44 min    Activity Tolerance Patient tolerated treatment well    Behavior During Therapy [facility] for tasks assessed/performed              Past Medical History:  Diagnosis Date   DVT (deep venous thrombosis) (HCC)    after half marathon    Dyslipidemia    GERD (gastroesophageal reflux disease)    Gilbert's disease    HH (hiatus hernia)    HIV positive (HCC) 92   Hyperlipidemia    Renal stone    Past Surgical History:  Procedure Laterality Date   APPENDECTOMY     COLONOSCOPY     last 10 years + ago 2002 in epic- colon all normal   SKIN BIOPSY Right 03/14/2018   seborrheic keratosis inflamed   UPPER GASTROINTESTINAL ENDOSCOPY     Patient Active Problem List   Diagnosis Date Noted   Essential hypertension 03/24/2021   Aortic atherosclerosis (HCC) 04/23/2019   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) 01/31/2019   Stage 3a chronic kidney disease (HCC) 01/30/2019   History of esophageal stricture 06/15/2015   History of herpes genitalis 12/01/2014   H/O herpes labialis 12/01/2014   Gilbert's disease 06/03/2014   HH (hiatus hernia) 04/15/2012   History of renal stone 04/15/2012   Hyperlipidemia 04/11/2011   HIV disease (HCC) 04/11/2011    PCP: Dr Sharlot Gowda   REFERRING PROVIDER: Dr Sharlot Gowda   REFERRING DIAG:   THERAPY DIAG:  Other muscle spasm  Pain in thoracic spine  Muscle weakness (generalized)  Rationale for Evaluation and Treatment: Rehabilitation  ONSET DATE:   SUBJECTIVE:                                                                                                                                                                                                          SUBJECTIVE STATEMENT: Patient reports several months ago he began having upper trap pain on the left side.  He has no prior history of such issue.  He he had an insidious onset.  The pain is progressed he is now having pain into his  rib cage and pain in his trap at times.  He likes to workout frequently.  He notices more pain when he uses his arm for things like driving and endurance activities.  He has been cleared by cardiologist.  They believe this more of a musculoskeletal issue. Hand dominance: Right  PERTINENT HISTORY:  History of DVT several years ago, Gilbert's disease, HIV  PAIN:  Are you having pain? Yes: NPRS scale: 1-2/10 ache on the left side of the back  Pain location: Left upper trap/ mid back/ chest pain  Pain description: aching  Aggravating factors: hold his arm out  Relieving factors:   PRECAUTIONS: None  WEIGHT BEARING RESTRICTIONS: No  FALLS:  Has patient fallen in last 6 months?   LIVING ENVIRONMENT: OCCUPATION:  Metal health counselor   Recreation:  Work out regularly; reading     PLOF: Independent  PATIENT GOALS:    NEXT MD VISIT:   OBJECTIVE:   DIAGNOSTIC FINDINGS:  Cardiac CT showed mild degeneration in the thoracic spine  PATIENT SURVEYS:  FOTO    COGNITION: Overall cognitive status: Within functional limits for tasks assessed  SENSATION: WFL  POSTURE: No Significant postural limitations  PALPATION: No significant tenderness to palpation in the upper trap and levator area.  Patient reports when he is sore he does have pain and tenderness in this area.  Moderate soreness to palpation in the mid thoracic area around T6-7 and 8.   CERVICAL ROM:   Active ROM A/PROM (deg) eval  Flexion Full  Extension Full  Right lateral flexion Within normal limits  Left lateral flexion Painful  Right rotation 60  Left rotation 60   (Blank rows = not  tested)  UPPER EXTREMITY ROM:  Active ROM Right eval Left eval  Shoulder flexion Full Full  Shoulder extension    Shoulder abduction    Shoulder adduction    Shoulder extension    Shoulder internal rotation Full Painful  Shoulder external rotation Full Full  Elbow flexion    Elbow extension    Wrist flexion    Wrist extension    Wrist ulnar deviation    Wrist radial deviation    Wrist pronation    Wrist supination     (Blank rows = not tested)  UPPER EXTREMITY MMT:  MMT Right eval Left eval  Shoulder flexion 9.9 8.5  Shoulder extension    Shoulder abduction    Shoulder adduction    Shoulder extension    Shoulder internal rotation 19.0 11.4  Shoulder external rotation 14.5 14.1  Middle trapezius    Lower trapezius    Elbow flexion    Elbow extension 20.6 11.0  Wrist flexion    Wrist extension    Wrist ulnar deviation    Wrist radial deviation    Wrist pronation    Wrist supination    Grip strength 90 60   (Blank rows = not tested)  CERVICAL SPECIAL TESTS:  Left spurlings into the mid back  FUNCTIONAL TESTS:    TODAY'S TREATMENT:  DATE:   7/10 Trigger Point Dry-Needling  Treatment instructions: Expect mild to moderate muscle soreness. S/S of pneumothorax if dry needled over a lung field, and to seek immediate medical attention should they occur. Patient verbalized understanding of these instructions and education.  Patient Consent Given: Yes Education handout provided: Yes Muscles treated: 3 spots in left upper trap .30x50 needle  Electrical stimulation performed: No Parameters: N/A Treatment response/outcome: great twitch with all needles   Manual: skilled palpation of trigger points; manual traction; manual upper trap stretch, sub-occipital release   Standing flexion 2x10 3lbs  Standing scaption 2x10 3lbs   Row 40 lbs with  cuing to keep shoulders down. Triceps kick back 25 lbs right 10 lbs    Reviewed posture with exercises. Patient advise to hold on thoracic extension because lying with slight extension caused pain.   Eval  Access Code: [email or web address] URL: [email or web address] Date: 08/23/2022 Prepared by: Lorayne Bender  Exercises - Theracane Over Shoulder  - 1 x daily - 7 x weekly - 3 sets - 10 reps - Seated Mid Back Stretch  - 1 x daily - 7 x weekly - 3 sets - 3 reps - 15-20 sec  hold - Gentle Levator Scapulae Stretch  - 1 x daily - 7 x weekly - 3 sets - 3 reps - 20  hold - Seated Upper Trapezius Stretch  - 1 x daily - 7 x weekly - 3 sets - 3 reps - 20 sec  hold - Thoracic Extension Mobilization on Foam Roll  - 1 x daily - 7 x weekly - 1 sets - 10 reps - Doorway Pec Stretch at 90 Degrees Abduction  - 1 x daily - 7 x weekly - 3 sets - 10 reps  PATIENT EDUCATION:  Education details: HEP, symptom mangement  Person educated: Patient Education method: Explanation, Demonstration, Tactile cues, Verbal cues, and Handouts Education comprehension: verbalized understanding, returned demonstration, verbal cues required, tactile cues required, and needs further education  HOME EXERCISE PROGRAM: Access Code: [email or web address] URL: [email or web address] Date: 08/23/2022 Prepared by: Lorayne Bender  Exercises - Theracane Over Shoulder  - 1 x daily - 7 x weekly - 3 sets - 10 reps - Seated Mid Back Stretch  - 1 x daily - 7 x weekly - 3 sets - 3 reps - 15-20 sec  hold - Gentle Levator Scapulae Stretch  - 1 x daily - 7 x weekly - 3 sets - 3 reps - 20  hold - Seated Upper Trapezius Stretch  - 1 x daily - 7 x weekly - 3 sets - 3 reps - 20 sec  hold - Thoracic Extension Mobilization on Foam Roll  - 1 x daily - 7 x weekly - 1 sets - 10 reps - Doorway Pec Stretch at 90 Degrees Abduction  - 1 x daily - 7 x weekly - 3 sets - 10 reps  ASSESSMENT:  CLINICAL IMPRESSION: The patient's chief complaint today  was pain radiating down his arm.  He reported no significant rib cage pain today.  Patient tolerated trigger point dry needling well.  He had a good trip twitch in his upper trap.  He did have a reproduction of his pain while lying prone with his face in the table.  His neck was in slight extension.  He has reported an increase in radicular pain overall.  We took thoracic extension out because of potential back extension when he is performing the exercises.  We will reassess over the next couple days and  see if that changes his radicular pain.  We reviewed tricep strengthening and exercises in the gym while keeping his upper traps disengaged.  Therapy will assess tolerance of trigger point dry needling next visit and progress as tolerated.  Eval:Patient is a 57 year old male who presents with left upper trap pain and tightness as well as rib cage pain that goes into his anterior chest.  He has tenderness to palpation in his mid thoracic area.  The patient radiating pain along his rib cage could suggest some type of thoracic disc issue although we cannot reproduce with thoracic motion.  He has mild limitations in cervical rotation.  He has reproduction of back and pain radiating into his shoulder blade with cervical left sidebending.  He has some tightness in his upper trap and levator musculature but no significant trigger points at this time.  He does report that when he is hurting it is tender to palpation in that area.  He has increased pain when he has to hold his left arm out for things like driving.  He would benefit from skilled therapy including trigger point dry needling in order to decrease muscle spasming, improve functional endurance of his left arm, and decreased pain in his posterior shoulder, cervical spine area, and rib cage/chest area. OBJECTIVE IMPAIRMENTS: decreased activity tolerance, decreased endurance, decreased strength, increased muscle spasms, and pain.   ACTIVITY LIMITATIONS:  carrying, lifting, and reach over head  PARTICIPATION LIMITATIONS: driving and community activity  PERSONAL FACTORS: None are also affecting patient's functional outcome.   REHAB POTENTIAL: Excellent  CLINICAL DECISION MAKING: Stable/uncomplicated  EVALUATION COMPLEXITY: Low   GOALS: Goals reviewed with patient? Yes  SHORT TERM GOALS: Target date: 10/05/2022    Patient will increase cervical rotation by 20 degrees Baseline:  Goal status: INITIAL  2.  Patient will increase triceps and shoulder internal rotation strength by 5 pounds Baseline:  Goal status: INITIAL  3.  Patient will reach behind his back with left arm without pain Baseline:  Goal status: INITIAL  LONG TERM GOALS: Target date: 10/05/2022    Patient will drive without pain. Baseline:  Goal status: INITIAL  2.  Patient will perform full gym program without pain Baseline:  Goal status: INITIAL  3.  Patient will have a full soft tissue and stretching program to prevent further exacerbation of symptoms. Baseline:  Goal status: INITIAL   PLAN:  PT FREQUENCY: 1x/week  PT DURATION: 6 weeks  PLANNED INTERVENTIONS: Therapeutic exercises, Therapeutic activity, Neuromuscular re-education, Balance training, Gait training, Patient/Family education, Self Care, Joint mobilization, Aquatic Therapy, Dry Needling, Electrical stimulation, Cryotherapy, Moist heat, and Manual therapy  PLAN FOR NEXT SESSION: Assess tolerance to thoracic extension activities.  Assess self soft tissue mobilization.  Assess HEP for stretching and see which exercises worse at work the best form.  Reviewed gym exercises.  Work on RPE to grade his exercise weights properly.  Consider shoulder endurance exercises including wall clock and wall walk and ball versus ball.  Trigger point dry needling (HIV+)   Dessie Coma, PT 09/06/2022, 1:23 PM

## 2022-09-11 ENCOUNTER — Other Ambulatory Visit: Payer: Self-pay

## 2022-09-11 ENCOUNTER — Other Ambulatory Visit (HOSPITAL_BASED_OUTPATIENT_CLINIC_OR_DEPARTMENT_OTHER): Payer: Self-pay

## 2022-09-13 ENCOUNTER — Other Ambulatory Visit (HOSPITAL_COMMUNITY): Payer: Self-pay

## 2022-09-15 ENCOUNTER — Other Ambulatory Visit (HOSPITAL_COMMUNITY): Payer: Self-pay

## 2022-09-20 ENCOUNTER — Other Ambulatory Visit: Payer: Self-pay | Admitting: Family Medicine

## 2022-09-20 DIAGNOSIS — C44612 Basal cell carcinoma of skin of right upper limb, including shoulder: Secondary | ICD-10-CM | POA: Diagnosis not present

## 2022-09-20 DIAGNOSIS — Z8619 Personal history of other infectious and parasitic diseases: Secondary | ICD-10-CM

## 2022-09-21 ENCOUNTER — Encounter (HOSPITAL_BASED_OUTPATIENT_CLINIC_OR_DEPARTMENT_OTHER): Payer: Self-pay | Admitting: Physical Therapy

## 2022-09-21 ENCOUNTER — Other Ambulatory Visit (HOSPITAL_BASED_OUTPATIENT_CLINIC_OR_DEPARTMENT_OTHER): Payer: Self-pay

## 2022-09-21 ENCOUNTER — Ambulatory Visit (HOSPITAL_BASED_OUTPATIENT_CLINIC_OR_DEPARTMENT_OTHER): Payer: 59 | Admitting: Physical Therapy

## 2022-09-21 DIAGNOSIS — M546 Pain in thoracic spine: Secondary | ICD-10-CM

## 2022-09-21 DIAGNOSIS — M62838 Other muscle spasm: Secondary | ICD-10-CM

## 2022-09-21 DIAGNOSIS — M6281 Muscle weakness (generalized): Secondary | ICD-10-CM

## 2022-09-21 MED ORDER — VALACYCLOVIR HCL 1 G PO TABS
1000.0000 mg | ORAL_TABLET | Freq: Every day | ORAL | 1 refills | Status: DC
Start: 2022-09-21 — End: 2022-10-26
  Filled 2022-09-21: qty 30, 30d supply, fill #0
  Filled 2022-10-16: qty 30, 30d supply, fill #1

## 2022-09-21 NOTE — Therapy (Signed)
OUTPATIENT PHYSICAL THERAPY CERVICAL EVALUATION   Patient Name: Darryl Black MRN: [id number] DOB:04-16-1965, 57 y.o., male Today's Date: 09/21/2022  END OF SESSION:  PT End of Session - 09/21/22 0930     Visit Number 3    Number of Visits 8    Date for PT Re-Evaluation 10/18/22    PT Start Time 0930    PT Stop Time 1012    PT Time Calculation (min) 42 min    Activity Tolerance Patient tolerated treatment well    Behavior During Therapy [facility] for tasks assessed/performed              Past Medical History:  Diagnosis Date   DVT (deep venous thrombosis) (HCC)    after half marathon    Dyslipidemia    GERD (gastroesophageal reflux disease)    Gilbert's disease    HH (hiatus hernia)    HIV positive (HCC) 92   Hyperlipidemia    Renal stone    Past Surgical History:  Procedure Laterality Date   APPENDECTOMY     COLONOSCOPY     last 10 years + ago 2002 in epic- colon all normal   SKIN BIOPSY Right 03/14/2018   seborrheic keratosis inflamed   UPPER GASTROINTESTINAL ENDOSCOPY     Patient Active Problem List   Diagnosis Date Noted   Essential hypertension 03/24/2021   Aortic atherosclerosis (HCC) 04/23/2019   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) 01/31/2019   Stage 3a chronic kidney disease (HCC) 01/30/2019   History of esophageal stricture 06/15/2015   History of herpes genitalis 12/01/2014   H/O herpes labialis 12/01/2014   Gilbert's disease 06/03/2014   HH (hiatus hernia) 04/15/2012   History of renal stone 04/15/2012   Hyperlipidemia 04/11/2011   HIV disease (HCC) 04/11/2011    PCP: Dr Sharlot Gowda   REFERRING PROVIDER: Dr Sharlot Gowda   REFERRING DIAG:   THERAPY DIAG:  Other muscle spasm  Pain in thoracic spine  Muscle weakness (generalized)  Rationale for Evaluation and Treatment: Rehabilitation  ONSET DATE:   SUBJECTIVE:                                                                                                                                                                                                          SUBJECTIVE STATEMENT: Denies HA. Pins/needles when upper body flexes forward.  Hand dominance: Right  PERTINENT HISTORY:  History of DVT several years ago, Gilbert's disease, HIV  PAIN:  Are you having pain? Yes: NPRS scale: 1-2/10 ache on the left side of the  back  Pain location: Left upper trap/ mid back/ chest pain  Pain description: aching  Aggravating factors: hold his arm out  Relieving factors:   PRECAUTIONS: None  WEIGHT BEARING RESTRICTIONS: No  FALLS:  Has patient fallen in last 6 months?   LIVING ENVIRONMENT: OCCUPATION:  Metal health counselor   Recreation:  Work out regularly; reading     PLOF: Independent  PATIENT GOALS:  Decr pain  OBJECTIVE:   DIAGNOSTIC FINDINGS:  Cardiac CT showed mild degeneration in the thoracic spine  PATIENT SURVEYS:  FOTO    COGNITION: Overall cognitive status: Within functional limits for tasks assessed  SENSATION: WFL  POSTURE: No Significant postural limitations  PALPATION: No significant tenderness to palpation in the upper trap and levator area.  Patient reports when he is sore he does have pain and tenderness in this area.  Moderate soreness to palpation in the mid thoracic area around T6-7 and 8.   CERVICAL ROM:   Active ROM A/PROM (deg) eval  Flexion Full  Extension Full  Right lateral flexion Within normal limits  Left lateral flexion Painful  Right rotation 60  Left rotation 60   (Blank rows = not tested)  UPPER EXTREMITY ROM:  Active ROM Right eval Left eval  Shoulder flexion Full Full  Shoulder extension    Shoulder abduction    Shoulder adduction    Shoulder extension    Shoulder internal rotation Full Painful  Shoulder external rotation Full Full  Elbow flexion    Elbow extension    Wrist flexion    Wrist extension    Wrist ulnar deviation    Wrist radial deviation    Wrist pronation    Wrist  supination     (Blank rows = not tested)  UPPER EXTREMITY MMT:  MMT Right eval Left eval  Shoulder flexion 9.9 8.5  Shoulder extension    Shoulder abduction    Shoulder adduction    Shoulder extension    Shoulder internal rotation 19.0 11.4  Shoulder external rotation 14.5 14.1  Middle trapezius    Lower trapezius    Elbow flexion    Elbow extension 20.6 11.0  Wrist flexion    Wrist extension    Wrist ulnar deviation    Wrist radial deviation    Wrist pronation    Wrist supination    Grip strength 90 60   (Blank rows = not tested)  CERVICAL SPECIAL TESTS:  Left spurlings into the mid back  FUNCTIONAL TESTS:    TODAY'S TREATMENT:                                                                                                                              DATE:    Treatment                            7/25:  Trigger Point Dry Needling, Manual Therapy Treatment:  Initial  or subsequent education regarding Trigger Point Dry Needling: Subsequent Did patient give consent to treatment with Trigger Point Dry Needling: Yes TPDN with skilled palpation and monitoring followed by STM to the following muscles: bil upper trap, left infraspinatus, Lt C4, C5 paraspinals Supine cervical distraction and STM- negative impingement testing Prone rib mobs with towel roll under Lt shoulder  Pec stretch with strap Half foam roll- scissors,back stroke, horiz abd elbows ext and flexed Review bench press and triceps kick.    7/10 Trigger Point Dry-Needling  Treatment instructions: Expect mild to moderate muscle soreness. S/S of pneumothorax if dry needled over a lung field, and to seek immediate medical attention should they occur. Patient verbalized understanding of these instructions and education.  Patient Consent Given: Yes Education handout provided: Yes Muscles treated: 3 spots in left upper trap .30x50 needle  Electrical stimulation performed: No Parameters: N/A Treatment  response/outcome: great twitch with all needles   Manual: skilled palpation of trigger points; manual traction; manual upper trap stretch, sub-occipital release   Standing flexion 2x10 3lbs  Standing scaption 2x10 3lbs   Row 40 lbs with cuing to keep shoulders down. Triceps kick back 25 lbs right 10 lbs    Reviewed posture with exercises. Patient advise to hold on thoracic extension because lying with slight extension caused pain.   Eval  Access Code: [email or web address] URL: [email or web address] Date: 08/23/2022 Prepared by: Lorayne Bender  Exercises - Theracane Over Shoulder  - 1 x daily - 7 x weekly - 3 sets - 10 reps - Seated Mid Back Stretch  - 1 x daily - 7 x weekly - 3 sets - 3 reps - 15-20 sec  hold - Gentle Levator Scapulae Stretch  - 1 x daily - 7 x weekly - 3 sets - 3 reps - 20  hold - Seated Upper Trapezius Stretch  - 1 x daily - 7 x weekly - 3 sets - 3 reps - 20 sec  hold - Thoracic Extension Mobilization on Foam Roll  - 1 x daily - 7 x weekly - 1 sets - 10 reps - Doorway Pec Stretch at 90 Degrees Abduction  - 1 x daily - 7 x weekly - 3 sets - 10 reps  PATIENT EDUCATION:  Education details: HEP, symptom mangement  Person educated: Patient Education method: Explanation, Demonstration, Tactile cues, Verbal cues, and Handouts Education comprehension: verbalized understanding, returned demonstration, verbal cues required, tactile cues required, and needs further education  HOME EXERCISE PROGRAM: Access Code: [email or web address] URL: [email or web address]   ASSESSMENT:  CLINICAL IMPRESSION: Signs and symptoms consistent with tightness and pectoralis minor pulling head of glenohumeral joint anteriorly and creating biomechanical chain tension posteriorly.  Able to reduce concordant pain with dry needling and stretching of pectoralis minor while mobilizing glenohumeral joint posteriorly.  Added thoracic mobilizations and anterior chain stretching to HEP.  Reviewed  chest press and triceps extension exercises for proper positioning.  Eval:Patient is a 57 year old male who presents with left upper trap pain and tightness as well as rib cage pain that goes into his anterior chest.  He has tenderness to palpation in his mid thoracic area.  The patient radiating pain along his rib cage could suggest some type of thoracic disc issue although we cannot reproduce with thoracic motion.  He has mild limitations in cervical rotation.  He has reproduction of back and pain radiating into his shoulder blade with cervical left sidebending.  He has some tightness in his upper trap and levator musculature but no significant trigger  points at this time.  He does report that when he is hurting it is tender to palpation in that area.  He has increased pain when he has to hold his left arm out for things like driving.  He would benefit from skilled therapy including trigger point dry needling in order to decrease muscle spasming, improve functional endurance of his left arm, and decreased pain in his posterior shoulder, cervical spine area, and rib cage/chest area. OBJECTIVE IMPAIRMENTS: decreased activity tolerance, decreased endurance, decreased strength, increased muscle spasms, and pain.   ACTIVITY LIMITATIONS: carrying, lifting, and reach over head  PARTICIPATION LIMITATIONS: driving and community activity  PERSONAL FACTORS: None are also affecting patient's functional outcome.   REHAB POTENTIAL: Excellent  CLINICAL DECISION MAKING: Stable/uncomplicated  EVALUATION COMPLEXITY: Low   GOALS: Goals reviewed with patient? Yes  SHORT TERM GOALS: Target date: 10/05/2022    Patient will increase cervical rotation by 20 degrees Baseline:  Goal status: INITIAL  2.  Patient will increase triceps and shoulder internal rotation strength by 5 pounds Baseline:  Goal status: INITIAL  3.  Patient will reach behind his back with left arm without pain Baseline:  Goal status:  INITIAL  LONG TERM GOALS: Target date: 10/05/2022    Patient will drive without pain. Baseline:  Goal status: INITIAL  2.  Patient will perform full gym program without pain Baseline:  Goal status: INITIAL  3.  Patient will have a full soft tissue and stretching program to prevent further exacerbation of symptoms. Baseline:  Goal status: INITIAL   PLAN:  PT FREQUENCY: 1x/week  PT DURATION: 6 weeks  PLANNED INTERVENTIONS: Therapeutic exercises, Therapeutic activity, Neuromuscular re-education, Balance training, Gait training, Patient/Family education, Self Care, Joint mobilization, Aquatic Therapy, Dry Needling, Electrical stimulation, Cryotherapy, Moist heat, and Manual therapy  PLAN FOR NEXT SESSION: Assess tolerance to thoracic extension activities.  Assess self soft tissue mobilization.  Assess HEP for stretching and see which exercises worse at work the best form.  Reviewed gym exercises.  Work on RPE to grade his exercise weights properly.  Consider shoulder endurance exercises including wall clock and wall walk and ball versus ball.  Trigger point dry needling (HIV+)  Elwyn Lowden C. Tomasina Keasling PT, DPT 09/21/22 10:59 AM

## 2022-09-24 ENCOUNTER — Telehealth: Payer: 59 | Admitting: Nurse Practitioner

## 2022-09-24 DIAGNOSIS — G8929 Other chronic pain: Secondary | ICD-10-CM | POA: Diagnosis not present

## 2022-09-24 DIAGNOSIS — M545 Low back pain, unspecified: Secondary | ICD-10-CM

## 2022-09-24 MED ORDER — CYCLOBENZAPRINE HCL 10 MG PO TABS
10.0000 mg | ORAL_TABLET | Freq: Two times a day (BID) | ORAL | 1 refills | Status: DC | PRN
Start: 2022-09-24 — End: 2022-10-26

## 2022-09-24 MED ORDER — LIDOCAINE 5 % EX PTCH
1.0000 | MEDICATED_PATCH | CUTANEOUS | 0 refills | Status: DC
Start: 2022-09-24 — End: 2022-10-26

## 2022-09-24 MED ORDER — DICLOFENAC SODIUM 1 % EX GEL
4.0000 g | Freq: Four times a day (QID) | CUTANEOUS | 0 refills | Status: DC
Start: 2022-09-24 — End: 2022-10-26

## 2022-09-24 NOTE — Progress Notes (Signed)
Virtual Visit Consent   Darryl Black, you are scheduled for a virtual visit with a [facility] provider today. Just as with appointments in the office, your consent must be obtained to participate. Your consent will be active for this visit and any virtual visit you may have with one of our providers in the next 365 days. If you have a MyChart account, a copy of this consent can be sent to you electronically.  As this is a virtual visit, video technology does not allow for your provider to perform a traditional examination. This may limit your provider's ability to fully assess your condition. If your provider identifies any concerns that need to be evaluated in person or the need to arrange testing (such as labs, EKG, etc.), we will make arrangements to do so. Although advances in technology are sophisticated, we cannot ensure that it will always work on either your end or our end. If the connection with a video visit is poor, the visit may have to be switched to a telephone visit. With either a video or telephone visit, we are not always able to ensure that we have a secure connection.  By engaging in this virtual visit, you consent to the provision of healthcare and authorize for your insurance to be billed (if applicable) for the services provided during this visit. Depending on your insurance coverage, you may receive a charge related to this service.  I need to obtain your verbal consent now. Are you willing to proceed with your visit today? Darryl Black has provided verbal consent on 09/24/2022 for a virtual visit (video or telephone). Darryl Rigg, NP  Date: 09/24/2022 9:06 AM  Virtual Visit via Video Note   I, Darryl Black, connected with  Darryl Black  ([id number], 1965-05-30) on 09/24/22 at  9:00 AM EDT by a video-enabled telemedicine application and verified that I am speaking with the correct person using two identifiers.  Location: Patient: Virtual Visit Location Patient:  Home Provider: Virtual Visit Location Provider: Home Office   I discussed the limitations of evaluation and management by telemedicine and the availability of in person appointments. The patient expressed understanding and agreed to proceed.    History of Present Illness: Darryl Black is a 57 y.o. who identifies as a male who was assigned male at birth, and is being seen today for back Black.  Darryl Black. Has been going to physical therapy for his back Black but reports since yesterday he has been experiencing worsening back Black. He is currently taking flexeril and would like to know what other options he has in regard to back Black treatment as he has CKD. He does not endorse any symptoms of bowel or bladder incontinence.    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 Remove & Discard patch within 12 hours or as directed by MD, Disp: 30 patch, Rfl: 0   bictegravir-emtricitabine-tenofovir AF (BIKTARVY) 50-200-25 MG TABS tablet, Take 1 tablet by mouth daily., Disp: 90 tablet, Rfl: 3   cholecalciferol (VITAMIN D) 1000 UNITS tablet, Take 1,000 Units by mouth daily., Disp: , Rfl:    cyclobenzaprine (FLEXERIL) 10 MG tablet, Take 1 tablet (10 mg total)  by mouth 2 (two) times daily as needed for muscle spasms., Disp: 30 tablet, Rfl: 1   fenofibrate 160 MG tablet, TAKE 1 TABLET BY MOUTH ONCE A DAY, Disp: 90 tablet, Rfl: 3   icosapent Ethyl (VASCEPA) 1 g capsule, Take 2 capsules (2 g total) by mouth 2 (two) times daily., Disp: 360 capsule, Rfl: 3   losartan (COZAAR) 50 MG tablet, Take 1 tablet (50 mg total) by mouth daily., Disp: 90 tablet, Rfl: 3   Multiple Vitamin (MULTIVITAMIN WITH MINERALS) TABS, Take 1 tablet by mouth daily., Disp: , Rfl:    pantoprazole (PROTONIX) 40 MG tablet, Take 1 tablet (40 mg total) by mouth daily., Disp: 90 tablet, Rfl: 1   rosuvastatin (CRESTOR) 5 MG tablet, TAKE 1 TABLET BY MOUTH DAILY, Disp: 90 tablet, Rfl: 3   valACYclovir (VALTREX) 1000 MG tablet, Take 1 tablet (1,000 mg total) by mouth daily., Disp: 30 tablet, Rfl: 1   zolpidem (AMBIEN) 10 MG tablet, Take 1/2 to 1 tablet (5-10 mg total) by mouth at bedtime as needed for sleep, Disp: 30 tablet, Rfl: 0  Observations/Objective: Patient is well-developed, well-nourished in no acute distress.  Resting comfortably at home in bed.  Head is normocephalic, atraumatic.  No labored breathing.  Speech is clear and coherent with logical content.  Patient is alert and oriented at baseline.    Assessment and Plan: 1. 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  Dispense: 30 tablet; Refill: 1 - lidocaine (LIDODERM) 5 %; Place 1 patch onto the skin daily. Remove & Discard patch within 12 hours or as directed by MD  Dispense: 30 patch; Refill: 0 - diclofenac Sodium (VOLTAREN) 1 % GEL; Apply 4 g topically 4 (four) times daily.  Dispense: 200 g; Refill: 0  Work on losing weight to help reduce back Black. May alternate with heat and ice application for Black relief. May also alternate with acetaminophen and  as prescribed for back Black. Other  alternatives include massage, acupuncture and water aerobics.  You must stay active and avoid a sedentary lifestyle.    Follow Up Instructions: I discussed the assessment and treatment plan with the patient. The patient was provided an opportunity to ask questions and all were answered. The patient agreed with the plan and demonstrated an understanding of the instructions.  A copy of instructions were sent to the patient via MyChart unless otherwise noted below.    The patient was advised to call back or seek an in-person evaluation if the symptoms worsen or if the condition fails to improve as anticipated.  Time:  I spent 13 minutes with the patient via telehealth technology discussing the above problems/concerns.    Darryl Rigg, NP

## 2022-09-24 NOTE — Patient Instructions (Signed)
Darryl Black, thank you for joining Claiborne Rigg, NP for today's virtual visit.  While this provider is not your primary care provider (PCP), if your PCP is located in our provider database this encounter information will be shared with them immediately following your visit.   A [facility] MyChart account gives you access to today's visit and all your visits, tests, and labs performed at [facility] " click here if you don't have a [facility] MyChart account or go to [email or web address]  Consent: (Patient) Darryl Black provided verbal consent for this virtual visit at the beginning of the encounter.  Current Medications:  Current Outpatient Medications:    diclofenac Sodium (VOLTAREN) 1 % GEL, Apply 4 g topically 4 (four) times daily., Disp: 200 g, Rfl: 0   lidocaine (LIDODERM) 5 %, Place 1 patch onto the skin daily. Remove & Discard patch within 12 hours or as directed by MD, Disp: 30 patch, Rfl: 0   bictegravir-emtricitabine-tenofovir AF (BIKTARVY) 50-200-25 MG TABS tablet, Take 1 tablet by mouth daily., Disp: 90 tablet, Rfl: 3   cholecalciferol (VITAMIN D) 1000 UNITS tablet, Take 1,000 Units by mouth daily., Disp: , Rfl:    cyclobenzaprine (FLEXERIL) 10 MG tablet, Take 1 tablet (10 mg total) by mouth 2 (two) times daily as needed for muscle spasms., Disp: 30 tablet, Rfl: 1   fenofibrate 160 MG tablet, TAKE 1 TABLET BY MOUTH ONCE A DAY, Disp: 90 tablet, Rfl: 3   icosapent Ethyl (VASCEPA) 1 g capsule, Take 2 capsules (2 g total) by mouth 2 (two) times daily., Disp: 360 capsule, Rfl: 3   losartan (COZAAR) 50 MG tablet, Take 1 tablet (50 mg total) by mouth daily., Disp: 90 tablet, Rfl: 3   Multiple Vitamin (MULTIVITAMIN WITH MINERALS) TABS, Take 1 tablet by mouth daily., Disp: , Rfl:    pantoprazole (PROTONIX) 40 MG tablet, Take 1 tablet (40 mg total) by mouth daily., Disp: 90 tablet, Rfl: 1   rosuvastatin (CRESTOR) 5 MG tablet, TAKE 1 TABLET BY MOUTH DAILY, Disp: 90  tablet, Rfl: 3   valACYclovir (VALTREX) 1000 MG tablet, Take 1 tablet (1,000 mg total) by mouth daily., Disp: 30 tablet, Rfl: 1   zolpidem (AMBIEN) 10 MG tablet, Take 1/2 to 1 tablet (5-10 mg total) by mouth at bedtime as needed for sleep, Disp: 30 tablet, Rfl: 0   Medications ordered in this encounter:  Meds ordered this encounter  Medications   cyclobenzaprine (FLEXERIL) 10 MG tablet    Sig: Take 1 tablet (10 mg total) by mouth 2 (two) times daily as needed for muscle spasms.    Dispense:  30 tablet    Refill:  1    Order Specific Question:   Supervising Provider    Answer:   LAMPTEY, PHILIP O [[id number]]   lidocaine (LIDODERM) 5 %    Sig: Place 1 patch onto the skin daily. Remove & Discard patch within 12 hours or as directed by MD    Dispense:  30 patch    Refill:  0    Order Specific Question:   Supervising Provider    Answer:   Merrilee Jansky [[id number]]   diclofenac Sodium (VOLTAREN) 1 % GEL    Sig: Apply 4 g topically 4 (four) times daily.    Dispense:  200 g    Refill:  0    Order Specific Question:   Supervising Provider    Answer:   Merrilee Jansky [id number]     *If you  need refills on other medications prior to your next appointment, please contact your pharmacy*  Follow-Up: Call back or seek an in-person evaluation if the symptoms worsen or if the condition fails to improve as anticipated.  [facility] [phone]  Other Instructions Work on losing weight to help reduce back pain. May alternate with heat and ice application for pain relief. May also alternate with acetaminophen and  as prescribed for back pain. Other alternatives include massage, acupuncture and water aerobics.  You must stay active and avoid a sedentary lifestyle.     If you have been instructed to have an in-person evaluation today at a local Urgent Care facility, please use the link below. It will take you to a list of all of our available [facility], including  address, phone number and hours of operation. Please do not delay care.  [facility]  If you or a family member do not have a primary care provider, use the link below to schedule a visit and establish care. When you choose a [facility] primary care physician or advanced practice provider, you gain a long-term partner in health. Find a Primary Care Provider  Learn more about [facility]'s in-office and virtual care options: [facility] - Get Care Now

## 2022-09-25 ENCOUNTER — Encounter: Payer: Self-pay | Admitting: Family Medicine

## 2022-09-25 ENCOUNTER — Ambulatory Visit (INDEPENDENT_AMBULATORY_CARE_PROVIDER_SITE_OTHER): Payer: 59 | Admitting: Family Medicine

## 2022-09-25 VITALS — BP 128/82 | HR 96 | Wt 185.6 lb

## 2022-09-25 DIAGNOSIS — I700 Atherosclerosis of aorta: Secondary | ICD-10-CM

## 2022-09-25 DIAGNOSIS — M4802 Spinal stenosis, cervical region: Secondary | ICD-10-CM

## 2022-09-25 DIAGNOSIS — I447 Left bundle-branch block, unspecified: Secondary | ICD-10-CM

## 2022-09-25 DIAGNOSIS — M549 Dorsalgia, unspecified: Secondary | ICD-10-CM

## 2022-09-25 DIAGNOSIS — R292 Abnormal reflex: Secondary | ICD-10-CM

## 2022-09-25 DIAGNOSIS — N400 Enlarged prostate without lower urinary tract symptoms: Secondary | ICD-10-CM

## 2022-09-25 NOTE — Progress Notes (Addendum)
   Subjective:    Patient ID: Darryl Black, male    DOB: 12/29/1965, 57 y.o.   MRN: [id number]  HPI He is here for a recheck.  Since last being seen he did go to physical therapy which has been only minimally effective.  He then had difficulty with the pain radiating into his chest and then into his arm.  He was seen by cardiology and had an extensive workup to eliminate cardiac cause of this.  This included CT scans as well as chest x-ray and blood work.  Apparently left bundle block was seen.  Also the evaluation noted aortic atherosclerosis and BPH. He also notes that since he started noted in the left arm pain with referred pain he is also noted weakness in his triceps.  Also apparently physical therapy also noted this.  Review of Systems     Objective:    Physical Exam Full motion of the neck with no tenderness.  Negative Spurling's test.  Motor strength does show weakness of the right triceps.  Reflexes were equivocal.       Assessment & Plan:   Problem List Items Addressed This Visit     Aortic atherosclerosis (HCC)   Benign prostatic hyperplasia without lower urinary tract symptoms   Other Visit Diagnoses     Upper back pain on left side    -  Primary   Relevant Orders   MR Cervical Spine Wo Contrast   Unequal triceps reflexes       Relevant Orders   MR Cervical Spine Wo Contrast     I explained that he has had an extensive workup for cardiac reasons for this and now with the left shoulder pain with referral and a weak triceps, we need to look at his cervical spine. Discussed BPH with him and at this point he is having minimal if any symptoms from that. 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  I discussed this with the patient and will refer to neurosurgery.

## 2022-09-26 ENCOUNTER — Ambulatory Visit (INDEPENDENT_AMBULATORY_CARE_PROVIDER_SITE_OTHER): Payer: 59 | Admitting: Pharmacist

## 2022-09-26 ENCOUNTER — Other Ambulatory Visit: Payer: Self-pay

## 2022-09-26 ENCOUNTER — Ambulatory Visit (HOSPITAL_BASED_OUTPATIENT_CLINIC_OR_DEPARTMENT_OTHER): Payer: 59 | Admitting: Physical Therapy

## 2022-09-26 DIAGNOSIS — M546 Pain in thoracic spine: Secondary | ICD-10-CM | POA: Diagnosis not present

## 2022-09-26 DIAGNOSIS — M62838 Other muscle spasm: Secondary | ICD-10-CM | POA: Diagnosis not present

## 2022-09-26 DIAGNOSIS — B20 Human immunodeficiency virus [HIV] disease: Secondary | ICD-10-CM

## 2022-09-26 DIAGNOSIS — M6281 Muscle weakness (generalized): Secondary | ICD-10-CM | POA: Diagnosis not present

## 2022-09-26 NOTE — Therapy (Signed)
OUTPATIENT PHYSICAL THERAPY CERVICAL EVALUATION   Patient Name: Darryl Black MRN: [id number] DOB:May 18, 1965, 57 y.o., male Today's Date: 09/26/2022  END OF SESSION:     Past Medical History:  Diagnosis Date   DVT (deep venous thrombosis) (HCC)    after half marathon    Dyslipidemia    GERD (gastroesophageal reflux disease)    Gilbert's disease    HH (hiatus hernia)    HIV positive (HCC) 89   Hyperlipidemia    Renal stone    Past Surgical History:  Procedure Laterality Date   APPENDECTOMY     COLONOSCOPY     last 10 years + ago 2002 in epic- colon all normal   SKIN BIOPSY Right 03/14/2018   seborrheic keratosis inflamed   UPPER GASTROINTESTINAL ENDOSCOPY     Patient Active Problem List   Diagnosis Date Noted   Benign prostatic hyperplasia without lower urinary tract symptoms 09/25/2022   LBBB (left bundle branch block) 09/25/2022   Essential hypertension 03/24/2021   Aortic atherosclerosis (HCC) 04/23/2019   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) 01/31/2019   Stage 3a chronic kidney disease (HCC) 01/30/2019   History of esophageal stricture 06/15/2015   History of herpes genitalis 12/01/2014   H/O herpes labialis 12/01/2014   Gilbert's disease 06/03/2014   HH (hiatus hernia) 04/15/2012   History of renal stone 04/15/2012   Hyperlipidemia 04/11/2011   HIV disease (HCC) 04/11/2011    PCP: Dr Sharlot Gowda   REFERRING PROVIDER: Dr Sharlot Gowda   REFERRING DIAG:   THERAPY DIAG:  No diagnosis found.  Rationale for Evaluation and Treatment: Rehabilitation  ONSET DATE:   SUBJECTIVE:                                                                                                                                                                                                         SUBJECTIVE STATEMENT: The patient reports this weekend at work he began to have a significant increase in pain in his upper trap and into his left posterior shoulder. He went  to his MD who ordered an MRI.  PERTINENT HISTORY:  History of DVT several years ago, Gilbert's disease, HIV  PAIN:  Are you having pain? Yes: NPRS scale: 8-9/10 ache on the left side of the back  Pain location: Left upper trap/ mid back/ chest pain  Pain description: aching  Aggravating factors: hold his arm out  Relieving factors:   PRECAUTIONS: None  WEIGHT BEARING RESTRICTIONS: No  FALLS:  Has patient fallen in last 6 months?   LIVING ENVIRONMENT:  OCCUPATION:  Metal health counselor   Recreation:  Work out regularly; reading     PLOF: Independent  PATIENT GOALS:  Decr pain  OBJECTIVE:   DIAGNOSTIC FINDINGS:  Cardiac CT showed mild degeneration in the thoracic spine  PATIENT SURVEYS:  FOTO    COGNITION: Overall cognitive status: Within functional limits for tasks assessed  SENSATION: WFL  POSTURE: No Significant postural limitations  PALPATION: No significant tenderness to palpation in the upper trap and levator area.  Patient reports when he is sore he does have pain and tenderness in this area.  Moderate soreness to palpation in the mid thoracic area around T6-7 and 8.   CERVICAL ROM:   Active ROM A/PROM (deg) eval  Flexion Full  Extension Full  Right lateral flexion Within normal limits  Left lateral flexion Painful  Right rotation 60  Left rotation 60   (Blank rows = not tested)  UPPER EXTREMITY ROM:  Active ROM Right eval Left eval  Shoulder flexion Full Full  Shoulder extension    Shoulder abduction    Shoulder adduction    Shoulder extension    Shoulder internal rotation Full Painful  Shoulder external rotation Full Full  Elbow flexion    Elbow extension    Wrist flexion    Wrist extension    Wrist ulnar deviation    Wrist radial deviation    Wrist pronation    Wrist supination     (Blank rows = not tested)  UPPER EXTREMITY MMT:  MMT Right eval Left eval  Shoulder flexion 9.9 8.5  Shoulder extension    Shoulder  abduction    Shoulder adduction    Shoulder extension    Shoulder internal rotation 19.0 11.4  Shoulder external rotation 14.5 14.1  Middle trapezius    Lower trapezius    Elbow flexion    Elbow extension 20.6 11.0  Wrist flexion    Wrist extension    Wrist ulnar deviation    Wrist radial deviation    Wrist pronation    Wrist supination    Grip strength 90 60   (Blank rows = not tested)  CERVICAL SPECIAL TESTS:  Left spurlings into the mid back  FUNCTIONAL TESTS:    TODAY'S TREATMENT:                                                                                                                              DATE:    7/30 Trigger Point Dry-Needling  Treatment instructions: Expect mild to moderate muscle soreness. S/S of pneumothorax if dry needled over a lung field, and to seek immediate medical attention should they occur. Patient verbalized understanding of these instructions and education.  Patient Consent Given: Yes Education handout provided: Yes Muscles treated: 3 spots in left upper trap .30x50 needle  Electrical stimulation performed: No Parameters: N/A Treatment response/outcome: great twitch with all needles   Manual: skilled palpation of trigger points; manual traction; manual upper trap stretch,  sub-occipital release; patient had to be in a significant flexion poistion to not have pain both in supine and prone  Ube x3 min. No increase in pain if he kept his head flexed.     Treatment                            7/25:  Trigger Point Dry Needling, Manual Therapy Treatment:  Initial or subsequent education regarding Trigger Point Dry Needling: Subsequent Did patient give consent to treatment with Trigger Point Dry Needling: Yes TPDN with skilled palpation and monitoring followed by STM to the following muscles: bil upper trap, left infraspinatus, Lt C4, C5 paraspinals Supine cervical distraction and STM- negative impingement testing Prone rib mobs with towel  roll under Lt shoulder  Pec stretch with strap Half foam roll- scissors,back stroke, horiz abd elbows ext and flexed Review bench press and triceps kick.    7/10 Trigger Point Dry-Needling  Treatment instructions: Expect mild to moderate muscle soreness. S/S of pneumothorax if dry needled over a lung field, and to seek immediate medical attention should they occur. Patient verbalized understanding of these instructions and education.  Patient Consent Given: Yes Education handout provided: Yes Muscles treated: 3 spots in left upper trap .30x50 needle  Electrical stimulation performed: No Parameters: N/A Treatment response/outcome: great twitch with all needles   Manual: skilled palpation of trigger points; manual traction; manual upper trap stretch, sub-occipital release   Standing flexion 2x10 3lbs  Standing scaption 2x10 3lbs   Row 40 lbs with cuing to keep shoulders down. Triceps kick back 25 lbs right 10 lbs    Reviewed posture with exercises. Patient advise to hold on thoracic extension because lying with slight extension caused pain.   Eval  Access Code: [email or web address] URL: [email or web address] Date: 08/23/2022 Prepared by: Lorayne Bender  Exercises - Theracane Over Shoulder  - 1 x daily - 7 x weekly - 3 sets - 10 reps - Seated Mid Back Stretch  - 1 x daily - 7 x weekly - 3 sets - 3 reps - 15-20 sec  hold - Gentle Levator Scapulae Stretch  - 1 x daily - 7 x weekly - 3 sets - 3 reps - 20  hold - Seated Upper Trapezius Stretch  - 1 x daily - 7 x weekly - 3 sets - 3 reps - 20 sec  hold - Thoracic Extension Mobilization on Foam Roll  - 1 x daily - 7 x weekly - 1 sets - 10 reps - Doorway Pec Stretch at 90 Degrees Abduction  - 1 x daily - 7 x weekly - 3 sets - 10 reps  PATIENT EDUCATION:  Education details: HEP, symptom mangement  Person educated: Patient Education method: Explanation, Demonstration, Tactile cues, Verbal cues, and Handouts Education comprehension:  verbalized understanding, returned demonstration, verbal cues required, tactile cues required, and needs further education  HOME EXERCISE PROGRAM: Access Code: [email or web address] URL: [email or web address]   ASSESSMENT:  CLINICAL IMPRESSION: The patient has had a significant tightness in his upper trap. He had difficulty finding positions that didn't cause significant radicular symptoms. We performed manual therapy and needling without a significant improvement. He had a great twtich with needling. He was advised if it improves to call andwe will get him in again ASAP. He was other wise advised to call for his MRI ASAP and talk to his MD if it gets worse. He is in a very acute phase right now and the radicular symptoms  are concerning with how severe and reactive they are.    Eval:Patient is a 57 year old male who presents with left upper trap pain and tightness as well as rib cage pain that goes into his anterior chest.  He has tenderness to palpation in his mid thoracic area.  The patient radiating pain along his rib cage could suggest some type of thoracic disc issue although we cannot reproduce with thoracic motion.  He has mild limitations in cervical rotation.  He has reproduction of back and pain radiating into his shoulder blade with cervical left sidebending.  He has some tightness in his upper trap and levator musculature but no significant trigger points at this time.  He does report that when he is hurting it is tender to palpation in that area.  He has increased pain when he has to hold his left arm out for things like driving.  He would benefit from skilled therapy including trigger point dry needling in order to decrease muscle spasming, improve functional endurance of his left arm, and decreased pain in his posterior shoulder, cervical spine area, and rib cage/chest area. OBJECTIVE IMPAIRMENTS: decreased activity tolerance, decreased endurance, decreased strength, increased muscle  spasms, and pain.   ACTIVITY LIMITATIONS: carrying, lifting, and reach over head  PARTICIPATION LIMITATIONS: driving and community activity  PERSONAL FACTORS: None are also affecting patient's functional outcome.   REHAB POTENTIAL: Excellent  CLINICAL DECISION MAKING: Stable/uncomplicated  EVALUATION COMPLEXITY: Low   GOALS: Goals reviewed with patient? Yes  SHORT TERM GOALS: Target date: 10/05/2022    Patient will increase cervical rotation by 20 degrees Baseline:  Goal status: INITIAL  2.  Patient will increase triceps and shoulder internal rotation strength by 5 pounds Baseline:  Goal status: INITIAL  3.  Patient will reach behind his back with left arm without pain Baseline:  Goal status: INITIAL  LONG TERM GOALS: Target date: 10/05/2022    Patient will drive without pain. Baseline:  Goal status: INITIAL  2.  Patient will perform full gym program without pain Baseline:  Goal status: INITIAL  3.  Patient will have a full soft tissue and stretching program to prevent further exacerbation of symptoms. Baseline:  Goal status: INITIAL   PLAN:  PT FREQUENCY: 1x/week  PT DURATION: 6 weeks  PLANNED INTERVENTIONS: Therapeutic exercises, Therapeutic activity, Neuromuscular re-education, Balance training, Gait training, Patient/Family education, Self Care, Joint mobilization, Aquatic Therapy, Dry Needling, Electrical stimulation, Cryotherapy, Moist heat, and Manual therapy  PLAN FOR NEXT SESSION: Assess tolerance to thoracic extension activities.  Assess self soft tissue mobilization.  Assess HEP for stretching and see which exercises worse at work the best form.  Reviewed gym exercises.  Work on RPE to grade his exercise weights properly.  Consider shoulder endurance exercises including wall clock and wall walk and ball versus ball.  Trigger point dry needling (HIV+)  Jessica C. Hightower PT, DPT 09/26/22 9:36 AM

## 2022-09-26 NOTE — Progress Notes (Signed)
Virtual Visit via Telephone Note  I connected with  Darryl Black on 09/26/22 at  4:00 PM EDT by telephone and verified that I am speaking with the correct person using two identifiers.  Location: Patient: Home Provider: Office   I discussed the limitations, risks, security and privacy concerns of performing an evaluation and management service by telephone and the availability of in person appointments. I also discussed with the patient that there may be a patient responsible charge related to this service. The patient expressed understanding and agreed to proceed.  Date:  09/26/2022   HPI: Darryl Black is a 57 y.o. male who presents for follow-up of their specialty HIV medication, Biktarvy.  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   CHOLECALCIFEROL (VITAMIN D) 1000 UNITS TABLET    Take 1,000 Units by mouth daily.   CYCLOBENZAPRINE (FLEXERIL) 10 MG TABLET    Take 1 tablet (10 mg total) by mouth 2 (two) times daily as needed for muscle spasms.   DICLOFENAC SODIUM (VOLTAREN) 1 % GEL    Apply 4 g topically 4 (four) times daily.   FENOFIBRATE 160 MG TABLET    TAKE 1 TABLET BY MOUTH ONCE A DAY    ICOSAPENT ETHYL (VASCEPA) 1 G CAPSULE    Take 2 capsules (2 g total) by mouth 2 (two) times daily.   LIDOCAINE (LIDODERM) 5 %    Place 1 patch onto the skin daily. Remove & Discard patch within 12 hours or as directed by MD   LOSARTAN (COZAAR) 50 MG TABLET    Take 1 tablet (50 mg total) by mouth daily.   MULTIPLE VITAMIN (MULTIVITAMIN WITH MINERALS) TABS    Take 1 tablet by mouth daily.   PANTOPRAZOLE (PROTONIX) 40 MG TABLET    Take 1 tablet (40 mg total) by mouth daily.   ROSUVASTATIN (CRESTOR) 5 MG TABLET    TAKE 1 TABLET BY MOUTH DAILY   VALACYCLOVIR (VALTREX) 1000 MG TABLET    Take 1 tablet (1,000 mg total) by mouth daily.   ZOLPIDEM (AMBIEN) 10 MG TABLET    Take 1/2 to 1 tablet (5-10 mg total) by mouth at bedtime as needed for sleep  Modified Medications   No medications on file  Discontinued Medications   No medications on file    Allergies: No Known Allergies  Past Medical History: Past Medical History:  Diagnosis Date   DVT (deep venous thrombosis) (HCC)    after half marathon    Dyslipidemia    GERD (gastroesophageal reflux disease)    Gilbert's disease    HH (hiatus hernia)    HIV positive (HCC) 89   Hyperlipidemia    Renal stone     Social History:  Social History   Socioeconomic History   Marital status: Married    Spouse name: Not on file   Number of children: Not on file   Years of education: Not on file   Highest education level: Not on file  Occupational History   Occupation: Mental Health Counselor    Employer: [employer]  Tobacco Use   Smoking status: Never   Smokeless tobacco: Never  Substance and Sexual Activity   Alcohol use: Never   Drug use: Never   Sexual activity: Yes  Other Topics Concern   Not on file  Social History Narrative   Not on file   Social Determinants of Health   Financial Resource Strain: Not on file  Food Insecurity: Not on file  Transportation Needs: Not on file  Physical Activity: Not on file   Stress: Not on file  Social Connections: Not on file        No data to display          Labs:  SCr: Lab Results  Component Value Date   CREATININE 2.03 (H) 08/18/2022   CREATININE 1.55 (H) 04/24/2022   CREATININE 1.60 (H) 10/20/2021   CREATININE 1.65 (H) 04/26/2021   CREATININE 1.67 (H) 08/20/2020   HIV No results found for: "HIV" Hepatitis B No results found for: "HEPBSAB", "HEPBSAG", "HEPBCAB" Hepatitis C No results found for: "HEPCAB", "HCVRNAPCRQN" Hepatitis A No results found for: "HAV" RPR and STI Lab Results  Component Value Date   LABRPR NON REAC 12/01/2014   LABRPR NON REAC 06/03/2014   LABRPR NON REAC 04/29/2013   LABRPR NON REAC 04/15/2012        No data to display          Assessment: I spoke with Darryl Black today regarding their specialty medication, Biktarvy for HIV treatment. Patient takes it every day without any issues or missed doses. No problems with adverse effects or tolerability. No problems getting it from [facility]. Updated/reviewed medication list - no drug interactions. All questions answered. Will follow up in 1 year.  Plan: - Continue Biktarvy - Follow up in 1 year  I discussed the assessment and treatment plan with the patient. The patient was provided an opportunity to ask questions and all were answered. The patient agreed with the plan and demonstrated an understanding of the instructions.   The patient was advised to call back or seek an in-person evaluation if the symptoms worsen or if the condition fails to improve as anticipated.  I provided 5 minutes of non-face-to-face time during this encounter.  Margarite Gouge, PharmD, CPP, BCIDP, AAHIVP Clinical Pharmacist Practitioner Infectious Diseases Clinical Pharmacist [facility]

## 2022-09-28 ENCOUNTER — Encounter: Payer: Self-pay | Admitting: Family Medicine

## 2022-10-02 ENCOUNTER — Ambulatory Visit
Admission: RE | Admit: 2022-10-02 | Discharge: 2022-10-02 | Disposition: A | Discharge status: Home / Self Care | Payer: 59 | Source: Ambulatory Visit | Attending: Family Medicine | Admitting: Family Medicine

## 2022-10-02 DIAGNOSIS — M5412 Radiculopathy, cervical region: Secondary | ICD-10-CM | POA: Diagnosis not present

## 2022-10-03 ENCOUNTER — Encounter (HOSPITAL_BASED_OUTPATIENT_CLINIC_OR_DEPARTMENT_OTHER): Payer: Self-pay | Admitting: Physical Therapy

## 2022-10-03 ENCOUNTER — Ambulatory Visit (HOSPITAL_BASED_OUTPATIENT_CLINIC_OR_DEPARTMENT_OTHER): Payer: 59 | Attending: Family Medicine | Admitting: Physical Therapy

## 2022-10-03 DIAGNOSIS — M62838 Other muscle spasm: Secondary | ICD-10-CM

## 2022-10-03 DIAGNOSIS — M6281 Muscle weakness (generalized): Secondary | ICD-10-CM | POA: Diagnosis not present

## 2022-10-03 DIAGNOSIS — M546 Pain in thoracic spine: Secondary | ICD-10-CM | POA: Diagnosis not present

## 2022-10-03 NOTE — Therapy (Signed)
OUTPATIENT PHYSICAL THERAPY CERVICAL EVALUATION   Patient Name: Darryl Black MRN: [id number] DOB:[birth year], 56 y.o., male Today's Date: 10/03/2022  END OF SESSION:  PT End of Session - 10/03/22 1156     Visit Number 5    Number of Visits 8    Date for PT Re-Evaluation 10/18/22    PT Start Time 1015    PT Stop Time 1053    PT Time Calculation (min) 38 min    Activity Tolerance Patient tolerated treatment well    Behavior During Therapy WFL for tasks assessed/performed               Past Medical History:  Diagnosis Date   DVT (deep venous thrombosis) (HCC)    after half marathon    Dyslipidemia    GERD (gastroesophageal reflux disease)    Gilbert's disease    HH (hiatus hernia)    HIV positive (HCC) 89   Hyperlipidemia    Renal stone    Past Surgical History:  Procedure Laterality Date   APPENDECTOMY     COLONOSCOPY     last 10 years + ago 2002 in epic- colon all normal   SKIN BIOPSY Right 03/14/2018   seborrheic keratosis inflamed   UPPER GASTROINTESTINAL ENDOSCOPY     Patient Active Problem List   Diagnosis Date Noted   Benign prostatic hyperplasia without lower urinary tract symptoms 09/25/2022   LBBB (left bundle branch block) 09/25/2022   Essential hypertension 03/24/2021   Aortic atherosclerosis (HCC) 04/23/2019   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) 01/31/2019   Stage 3a chronic kidney disease (HCC) 01/30/2019   History of esophageal stricture 06/15/2015   History of herpes genitalis 12/01/2014   H/O herpes labialis 12/01/2014   Gilbert's disease 06/03/2014   HH (hiatus hernia) 04/15/2012   History of renal stone 04/15/2012   Hyperlipidemia 04/11/2011   HIV disease (HCC) 04/11/2011    PCP: Dr Sharlot Gowda   REFERRING PROVIDER: Dr Sharlot Gowda   REFERRING DIAG:   THERAPY DIAG:  Other muscle spasm  Pain in thoracic spine  Muscle weakness (generalized)  Rationale for Evaluation and Treatment: Rehabilitation  ONSET DATE:    SUBJECTIVE:                                                                                                                                                                                                         SUBJECTIVE STATEMENT: The patient reports this weekend at work he began to have a significant increase in pain in his upper trap and into his  left posterior shoulder. He went to his MD who ordered an MRI.  PERTINENT HISTORY:  History of DVT several years ago, Gilbert's disease, HIV  PAIN:  Are you having pain? Yes: NPRS scale: 8-9/10 ache on the left side of the back  Pain location: Left upper trap/ mid back/ chest pain  Pain description: aching  Aggravating factors: hold his arm out  Relieving factors:   PRECAUTIONS: None  WEIGHT BEARING RESTRICTIONS: No  FALLS:  Has patient fallen in last 6 months?   LIVING ENVIRONMENT: OCCUPATION:  Metal health counselor   Recreation:  Work out regularly; reading     PLOF: Independent  PATIENT GOALS:  Decr pain  OBJECTIVE:   DIAGNOSTIC FINDINGS:  Cardiac CT showed mild degeneration in the thoracic spine  PATIENT SURVEYS:  FOTO    COGNITION: Overall cognitive status: Within functional limits for tasks assessed  SENSATION: WFL  POSTURE: No Significant postural limitations  PALPATION: No significant tenderness to palpation in the upper trap and levator area.  Patient reports when he is sore he does have pain and tenderness in this area.  Moderate soreness to palpation in the mid thoracic area around T6-7 and 8.   CERVICAL ROM:   Active ROM A/PROM (deg) eval  Flexion Full  Extension Full  Right lateral flexion Within normal limits  Left lateral flexion Painful  Right rotation 60  Left rotation 60   (Blank rows = not tested)  UPPER EXTREMITY ROM:  Active ROM Right eval Left eval  Shoulder flexion Full Full  Shoulder extension    Shoulder abduction    Shoulder adduction    Shoulder extension     Shoulder internal rotation Full Painful  Shoulder external rotation Full Full  Elbow flexion    Elbow extension    Wrist flexion    Wrist extension    Wrist ulnar deviation    Wrist radial deviation    Wrist pronation    Wrist supination     (Blank rows = not tested)  UPPER EXTREMITY MMT:  MMT Right eval Left eval  Shoulder flexion 9.9 8.5  Shoulder extension    Shoulder abduction    Shoulder adduction    Shoulder extension    Shoulder internal rotation 19.0 11.4  Shoulder external rotation 14.5 14.1  Middle trapezius    Lower trapezius    Elbow flexion    Elbow extension 20.6 11.0  Wrist flexion    Wrist extension    Wrist ulnar deviation    Wrist radial deviation    Wrist pronation    Wrist supination    Grip strength 90 60   (Blank rows = not tested)  CERVICAL SPECIAL TESTS:  Left spurlings into the mid back  FUNCTIONAL TESTS:    TODAY'S TREATMENT:  DATE:      8/6 Trigger Point Dry-Needling  Treatment instructions: Expect mild to moderate muscle soreness. S/S of pneumothorax if dry needled over a lung field, and to seek immediate medical attention should they occur. Patient verbalized understanding of these instructions and education.  Patient Consent Given: Yes Education handout provided: Yes Muscles treated: 3 spots in left upper trap .30x50 needle 2 spots in C6 and C7 left paraspinal  Electrical stimulation performed: No Parameters: N/A Treatment response/outcome: great twitch with all needles; could see it switching down into the bicep.   Manual: skilled palpation of trigger points; manual traction; manual upper trap stretch, sub-occipital release; patient had to be in a significant flexion poistion to not have pain both in supine and prone  Ube x3 min. No increase in pain if he kept his head flexed.    Cable : row 2x20 15  lbs  Shoulder extension 2x20 15lbs  7/30 Trigger Point Dry-Needling  Treatment instructions: Expect mild to moderate muscle soreness. S/S of pneumothorax if dry needled over a lung field, and to seek immediate medical attention should they occur. Patient verbalized understanding of these instructions and education.  Patient Consent Given: Yes Education handout provided: Yes Muscles treated: 3 spots in left upper trap .30x50 needle  Electrical stimulation performed: No Parameters: N/A Treatment response/outcome: great twitch with all needles   Manual: skilled palpation of trigger points; manual traction; manual upper trap stretch, sub-occipital release; patient had to be in a significant flexion poistion to not have pain both in supine and prone  Ube x3 min. No increase in pain if he kept his head flexed.     Treatment                            7/25:  Trigger Point Dry Needling, Manual Therapy Treatment:  Initial or subsequent education regarding Trigger Point Dry Needling: Subsequent Did patient give consent to treatment with Trigger Point Dry Needling: Yes TPDN with skilled palpation and monitoring followed by STM to the following muscles: bil upper trap, left infraspinatus, Lt C4, C5 paraspinals Supine cervical distraction and STM- negative impingement testing Prone rib mobs with towel roll under Lt shoulder  Pec stretch with strap Half foam roll- scissors,back stroke, horiz abd elbows ext and flexed Review bench press and triceps kick.    7/10 Trigger Point Dry-Needling  Treatment instructions: Expect mild to moderate muscle soreness. S/S of pneumothorax if dry needled over a lung field, and to seek immediate medical attention should they occur. Patient verbalized understanding of these instructions and education.  Patient Consent Given: Yes Education handout provided: Yes Muscles treated: 3 spots in left upper trap .30x50 needle  Electrical stimulation performed:  No Parameters: N/A Treatment response/outcome: great twitch with all needles   Manual: skilled palpation of trigger points; manual traction; manual upper trap stretch, sub-occipital release   Standing flexion 2x10 3lbs  Standing scaption 2x10 3lbs   Row 40 lbs with cuing to keep shoulders down. Triceps kick back 25 lbs right 10 lbs    Reviewed posture with exercises. Patient advise to hold on thoracic extension because lying with slight extension caused pain.   Eval  Access Code: [email or web address] URL: [email or web address] Date: 08/23/2022 Prepared by: Lorayne Bender  Exercises - Theracane Over Shoulder  - 1 x daily - 7 x weekly - 3 sets - 10 reps - Seated Mid Back Stretch  - 1 x daily - 7 x weekly -  3 sets - 3 reps - 15-20 sec  hold - Gentle Levator Scapulae Stretch  - 1 x daily - 7 x weekly - 3 sets - 3 reps - 20  hold - Seated Upper Trapezius Stretch  - 1 x daily - 7 x weekly - 3 sets - 3 reps - 20 sec  hold - Thoracic Extension Mobilization on Foam Roll  - 1 x daily - 7 x weekly - 1 sets - 10 reps - Doorway Pec Stretch at 90 Degrees Abduction  - 1 x daily - 7 x weekly - 3 sets - 10 reps  PATIENT EDUCATION:  Education details: HEP, symptom mangement  Person educated: Patient Education method: Explanation, Demonstration, Tactile cues, Verbal cues, and Handouts Education comprehension: verbalized understanding, returned demonstration, verbal cues required, tactile cues required, and needs further education  HOME EXERCISE PROGRAM: Access Code: [email or web address] URL: [email or web address]   ASSESSMENT:  CLINICAL IMPRESSION: The patient has improved since the last visit. We focused on needling and manual work again. He was able to lie without as much flexion this visit. We needed his paraspinal today. He had a twitch that went down and was visible in his tricep. He could feel the radicular pain into his triceps. We intitiated light exercise. He has not been able to  do much since his exacerbation of symptoms. We will continue 1W4 as long as he is making progress. We will also proceed per MD recommendation following MRI.   Eval:Patient is a 57 year old male who presents with left upper trap pain and tightness as well as rib cage pain that goes into his anterior chest.  He has tenderness to palpation in his mid thoracic area.  The patient radiating pain along his rib cage could suggest some type of thoracic disc issue although we cannot reproduce with thoracic motion.  He has mild limitations in cervical rotation.  He has reproduction of back and pain radiating into his shoulder blade with cervical left sidebending.  He has some tightness in his upper trap and levator musculature but no significant trigger points at this time.  He does report that when he is hurting it is tender to palpation in that area.  He has increased pain when he has to hold his left arm out for things like driving.  He would benefit from skilled therapy including trigger point dry needling in order to decrease muscle spasming, improve functional endurance of his left arm, and decreased pain in his posterior shoulder, cervical spine area, and rib cage/chest area. OBJECTIVE IMPAIRMENTS: decreased activity tolerance, decreased endurance, decreased strength, increased muscle spasms, and pain.   ACTIVITY LIMITATIONS: carrying, lifting, and reach over head  PARTICIPATION LIMITATIONS: driving and community activity  PERSONAL FACTORS: None are also affecting patient's functional outcome.   REHAB POTENTIAL: Excellent  CLINICAL DECISION MAKING: Stable/uncomplicated  EVALUATION COMPLEXITY: Low   GOALS: Goals reviewed with patient? Yes  SHORT TERM GOALS: Target date: 10/05/2022    Patient will increase cervical rotation by 20 degrees Baseline:  Goal status: INITIAL  2.  Patient will increase triceps and shoulder internal rotation strength by 5 pounds Baseline:  Goal status: INITIAL  3.   Patient will reach behind his back with left arm without pain Baseline:  Goal status: INITIAL  LONG TERM GOALS: Target date: 10/05/2022    Patient will drive without pain. Baseline:  Goal status: INITIAL  2.  Patient will perform full gym program without pain Baseline:  Goal status: INITIAL  3.  Patient will have  a full soft tissue and stretching program to prevent further exacerbation of symptoms. Baseline:  Goal status: INITIAL   PLAN:  PT FREQUENCY: 1x/week  PT DURATION: 6 weeks  PLANNED INTERVENTIONS: Therapeutic exercises, Therapeutic activity, Neuromuscular re-education, Balance training, Gait training, Patient/Family education, Self Care, Joint mobilization, Aquatic Therapy, Dry Needling, Electrical stimulation, Cryotherapy, Moist heat, and Manual therapy  PLAN FOR NEXT SESSION: Assess tolerance to thoracic extension activities.  Assess self soft tissue mobilization.  Assess HEP for stretching and see which exercises worse at work the best form.  Reviewed gym exercises.  Work on RPE to grade his exercise weights properly.  Consider shoulder endurance exercises including wall clock and wall walk and ball versus ball.  Trigger point dry needling (HIV+)  Jessica C. Hightower PT, DPT 10/03/22 12:51 PM

## 2022-10-04 ENCOUNTER — Encounter: Payer: Self-pay | Admitting: Family Medicine

## 2022-10-05 ENCOUNTER — Encounter: Payer: Self-pay | Admitting: Family Medicine

## 2022-10-06 ENCOUNTER — Telehealth: Payer: Self-pay | Admitting: Family Medicine

## 2022-10-06 NOTE — Telephone Encounter (Signed)
Letter typed & emailed to pt

## 2022-10-06 NOTE — Telephone Encounter (Signed)
Letter for work typed & I called & left message for pt to see how he wanted to get it,  went ahead and emailed to pt

## 2022-10-11 ENCOUNTER — Other Ambulatory Visit (HOSPITAL_COMMUNITY): Payer: Self-pay

## 2022-10-11 NOTE — Addendum Note (Signed)
Addended by: Ronnald Nian on: 10/11/2022 11:49 AM   Modules accepted: Orders

## 2022-10-16 ENCOUNTER — Other Ambulatory Visit (HOSPITAL_BASED_OUTPATIENT_CLINIC_OR_DEPARTMENT_OTHER): Payer: Self-pay

## 2022-10-18 DIAGNOSIS — M5090 Cervical disc disorder, unspecified, unspecified cervical region: Secondary | ICD-10-CM | POA: Diagnosis not present

## 2022-10-19 ENCOUNTER — Telehealth: Payer: Self-pay

## 2022-10-19 NOTE — Telephone Encounter (Signed)
   Pre-operative Risk Assessment    Patient Name: Darryl Black  DOB: Jun 12, 1965 MRN: [id number]   Last OV: 08/30/22 with Dr. Anne Fu Next OV: None   Request for Surgical Clearance    Procedure:   Cervical Arthroplasty  Date of Surgery:  Clearance 10/27/22                                 Surgeon:  Dr. Lisbeth Renshaw Surgeon's Group or Practice Name:  [facility] Phone number:  [phone] Fax number:  [phone] Attn: Lowella Bandy   Type of Clearance Requested:   - Medical    Type of Anesthesia:  General    Additional requests/questions:    SignedZada Finders   10/19/2022, 4:28 PM

## 2022-10-20 NOTE — Telephone Encounter (Signed)
   Patient Name: Darryl Black  DOB: 04/12/1965 MRN: [id number]  Primary Cardiologist: Donato Schultz, MD  Chart reviewed as part of pre-operative protocol coverage. Given past medical history and time since last visit, based on ACC/AHA guidelines, Darryl Black is at acceptable risk for the planned procedure without further cardiovascular testing.  Patient is able to complete greater than 4 METS of activity and RCRI score is 0.9% for upcoming procedure.  The patient was advised that if he develops new symptoms prior to surgery to contact our office to arrange for a follow-up visit, and he verbalized understanding.  I will route this recommendation to the requesting party via Epic fax function and remove from pre-op pool.  Please call with questions.  Napoleon Form, Leodis Rains, NP 10/20/2022, 9:09 AM

## 2022-10-24 ENCOUNTER — Encounter: Payer: Self-pay | Admitting: Family Medicine

## 2022-10-26 ENCOUNTER — Other Ambulatory Visit (HOSPITAL_COMMUNITY): Payer: Self-pay

## 2022-10-26 ENCOUNTER — Other Ambulatory Visit: Payer: Self-pay

## 2022-10-26 ENCOUNTER — Ambulatory Visit (INDEPENDENT_AMBULATORY_CARE_PROVIDER_SITE_OTHER): Payer: 59 | Admitting: Family Medicine

## 2022-10-26 ENCOUNTER — Encounter: Payer: Self-pay | Admitting: Family Medicine

## 2022-10-26 ENCOUNTER — Other Ambulatory Visit (HOSPITAL_BASED_OUTPATIENT_CLINIC_OR_DEPARTMENT_OTHER): Payer: Self-pay

## 2022-10-26 VITALS — BP 124/70 | HR 68 | Ht 69.75 in | Wt 181.8 lb

## 2022-10-26 DIAGNOSIS — I10 Essential (primary) hypertension: Secondary | ICD-10-CM

## 2022-10-26 DIAGNOSIS — F5102 Adjustment insomnia: Secondary | ICD-10-CM

## 2022-10-26 DIAGNOSIS — E7849 Other hyperlipidemia: Secondary | ICD-10-CM | POA: Diagnosis not present

## 2022-10-26 DIAGNOSIS — Z87442 Personal history of urinary calculi: Secondary | ICD-10-CM | POA: Diagnosis not present

## 2022-10-26 DIAGNOSIS — E119 Type 2 diabetes mellitus without complications: Secondary | ICD-10-CM | POA: Diagnosis not present

## 2022-10-26 DIAGNOSIS — Z8719 Personal history of other diseases of the digestive system: Secondary | ICD-10-CM

## 2022-10-26 DIAGNOSIS — Z0000 Encounter for general adult medical examination without abnormal findings: Secondary | ICD-10-CM

## 2022-10-26 DIAGNOSIS — N1831 Chronic kidney disease, stage 3a: Secondary | ICD-10-CM

## 2022-10-26 DIAGNOSIS — N400 Enlarged prostate without lower urinary tract symptoms: Secondary | ICD-10-CM

## 2022-10-26 DIAGNOSIS — I700 Atherosclerosis of aorta: Secondary | ICD-10-CM

## 2022-10-26 DIAGNOSIS — Z8619 Personal history of other infectious and parasitic diseases: Secondary | ICD-10-CM

## 2022-10-26 DIAGNOSIS — I447 Left bundle-branch block, unspecified: Secondary | ICD-10-CM

## 2022-10-26 DIAGNOSIS — E785 Hyperlipidemia, unspecified: Secondary | ICD-10-CM

## 2022-10-26 DIAGNOSIS — B20 Human immunodeficiency virus [HIV] disease: Secondary | ICD-10-CM | POA: Diagnosis not present

## 2022-10-26 DIAGNOSIS — E781 Pure hyperglyceridemia: Secondary | ICD-10-CM

## 2022-10-26 DIAGNOSIS — N5201 Erectile dysfunction due to arterial insufficiency: Secondary | ICD-10-CM

## 2022-10-26 MED ORDER — ZOLPIDEM TARTRATE 10 MG PO TABS
5.0000 mg | ORAL_TABLET | Freq: Every evening | ORAL | 0 refills | Status: DC | PRN
Start: 2022-10-26 — End: 2023-08-05
  Filled 2022-10-26: qty 30, 30d supply, fill #0

## 2022-10-26 MED ORDER — VALACYCLOVIR HCL 1 G PO TABS
1000.0000 mg | ORAL_TABLET | Freq: Every day | ORAL | 1 refills | Status: DC
Start: 2022-10-26 — End: 2023-08-06
  Filled 2022-10-26: qty 30, 30d supply, fill #0

## 2022-10-26 MED ORDER — TADALAFIL 20 MG PO TABS
20.0000 mg | ORAL_TABLET | Freq: Every day | ORAL | 0 refills | Status: AC | PRN
Start: 2022-10-26 — End: ?
  Filled 2022-10-26: qty 6, 30d supply, fill #0

## 2022-10-26 MED ORDER — ROSUVASTATIN CALCIUM 5 MG PO TABS
5.0000 mg | ORAL_TABLET | Freq: Every day | ORAL | 3 refills | Status: DC
Start: 2022-10-26 — End: 2023-11-05
  Filled 2022-10-26: qty 90, 90d supply, fill #0
  Filled 2023-01-24: qty 90, 90d supply, fill #1

## 2022-10-26 MED ORDER — FENOFIBRATE 160 MG PO TABS
160.0000 mg | ORAL_TABLET | Freq: Every day | ORAL | 3 refills | Status: DC
Start: 2022-10-26 — End: 2023-10-22
  Filled 2022-10-26: qty 90, 90d supply, fill #0
  Filled 2023-01-17: qty 90, 90d supply, fill #1

## 2022-10-26 MED ORDER — BIKTARVY 50-200-25 MG PO TABS
1.0000 | ORAL_TABLET | Freq: Every day | ORAL | 3 refills | Status: DC
Start: 2022-10-26 — End: 2022-11-03
  Filled 2022-10-26: qty 90, 90d supply, fill #0

## 2022-10-26 MED ORDER — PANTOPRAZOLE SODIUM 40 MG PO TBEC
40.0000 mg | DELAYED_RELEASE_TABLET | Freq: Every day | ORAL | 1 refills | Status: DC
Start: 2022-10-26 — End: 2023-07-27
  Filled 2022-10-26 – 2023-01-17 (×2): qty 90, 90d supply, fill #0

## 2022-10-26 MED ORDER — ICOSAPENT ETHYL 1 G PO CAPS
2.0000 g | ORAL_CAPSULE | Freq: Two times a day (BID) | ORAL | 3 refills | Status: DC
Start: 2022-10-26 — End: 2023-10-30
  Filled 2022-10-26: qty 360, 90d supply, fill #0

## 2022-10-26 NOTE — Progress Notes (Signed)
Complete physical exam  Patient: Darryl Black   DOB: April 06, 1965   57 y.o. Male  MRN: [id number]  Subjective:    Chief Complaint  Patient presents with   Annual Exam    Fasting annual exam. Having spinal disc replacement tomorrow am, Dr. Conchita Paris. WIll get flu shot at work. And will also try to give UA on his way out.     Darryl Black is a 57 y.o. male who presents today for a complete physical exam. He reports consuming a general diet.  Normally woks out at [facility] but has stopped for last 2 months due to neck pain.  He generally feels well. He reports sleeping well. He does not have additional problems to discuss today.  He does have a previous history of diabetes but most recent A1c is all been quite good.  Does have some underlying CKD but is not on great concern.  He continues on Vascepa, Crestor.  He is also taking Biktarvy and having no difficulty with that.  Does have a previous history of renal stone.  Discussed proper follow-up concerning that.  He continues to use Valtrex on a daily basis for his herpes genitalis.  He thinks that it helps but he is not sure that it is good getting full relief.  He is starting to have BPH symptoms of decreased stream.  He is also noted some intermittent difficulty with erectile dysfunction.  He does not smoke or drink and continues on his present medications.   Most recent fall risk assessment:    10/26/2022    8:24 AM  Fall Risk   Falls in the past year? 0  Number falls in past yr: 0  Injury with Fall? 0  Risk for fall due to : No Fall Risks  Follow up Falls evaluation completed     Most recent depression screenings:    10/26/2022    8:24 AM 10/20/2021    2:56 PM  PHQ 2/9 Scores  PHQ - 2 Score 0 0    Vision:Within last year    Patient Care Team: Ronnald Nian, MD as PCP - General (Family Medicine) Jake Bathe, MD as PCP - Cardiology (Cardiology)   Outpatient Medications Prior to Visit  Medication Sig Note    cholecalciferol (VITAMIN D) 1000 UNITS tablet Take 1,000 Units by mouth daily.    losartan (COZAAR) 50 MG tablet Take 1 tablet (50 mg total) by mouth daily.    Multiple Vitamin (MULTIVITAMIN WITH MINERALS) TABS Take 1 tablet by mouth daily.    [DISCONTINUED] bictegravir-emtricitabine-tenofovir AF (BIKTARVY) 50-200-25 MG TABS tablet Take 1 tablet by mouth daily.    [DISCONTINUED] fenofibrate 160 MG tablet TAKE 1 TABLET BY MOUTH ONCE A DAY    [DISCONTINUED] icosapent Ethyl (VASCEPA) 1 g capsule Take 2 capsules (2 g total) by mouth 2 (two) times daily.    [DISCONTINUED] pantoprazole (PROTONIX) 40 MG tablet Take 1 tablet (40 mg total) by mouth daily.    [DISCONTINUED] rosuvastatin (CRESTOR) 5 MG tablet TAKE 1 TABLET BY MOUTH DAILY    [DISCONTINUED] valACYclovir (VALTREX) 1000 MG tablet Take 1 tablet (1,000 mg total) by mouth daily. 10/26/2022: Has been taking one daily   [DISCONTINUED] cyclobenzaprine (FLEXERIL) 10 MG tablet Take 1 tablet (10 mg total) by mouth 2 (two) times daily as needed for muscle spasms.    [DISCONTINUED] diclofenac Sodium (VOLTAREN) 1 % GEL Apply 4 g topically 4 (four) times daily.    [DISCONTINUED] lidocaine (LIDODERM) 5 % Place  1 patch onto the skin daily. Remove & Discard patch within 12 hours or as directed by MD    [DISCONTINUED] zolpidem (AMBIEN) 10 MG tablet Take 1/2 to 1 tablet (5-10 mg total) by mouth at bedtime as needed for sleep (Patient not taking: Reported on 10/26/2022) 10/26/2022: As needed   No facility-administered medications prior to visit.    Review of Systems  All other systems reviewed and are negative.  Family and social history as well as health maintenance and immunizations was reviewed       Objective:     BP 124/70   Pulse 68   Ht 5' 9.75" (1.772 m)   Wt 181 lb 12.8 oz (82.5 kg)   BMI 26.27 kg/m    Physical Exam  Alert and in no distress. Tympanic membranes and canals are normal. Pharyngeal area is normal. Neck is supple without  adenopathy or thyromegaly. Cardiac exam shows a regular sinus rhythm without murmurs or gallops. Lungs are clear to auscultation.  Abdominal exam shows no masses or tenderness.  Bowel sounds.      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) - Plan: CBC with Differential/Platelet, Comprehensive metabolic panel, Lipid panel  Stage 3a chronic kidney disease (HCC) - Plan: CBC with Differential/Platelet, Comprehensive metabolic panel, Lipid panel  LBBB (left bundle branch block) - Plan: CBC with Differential/Platelet, Lipid panel  Other hyperlipidemia - Plan: CBC with Differential/Platelet, Lipid panel  HIV disease (HCC) - Plan: CBC with Differential/Platelet, Comprehensive metabolic panel, Lipid panel, HIV-1 RNA quant-no reflex-bld, T-helper cells (CD4) count (not at [facility]), bictegravir-emtricitabine-tenofovir AF (BIKTARVY) 50-200-25 MG TABS tablet  History of renal stone - Plan: CBC with Differential/Platelet, Lipid panel  History of esophageal stricture - Plan: CBC with Differential/Platelet, Lipid panel, pantoprazole (PROTONIX) 40 MG tablet  Essential hypertension - Plan: CBC with Differential/Platelet, Comprehensive metabolic panel, Lipid panel  Benign prostatic hyperplasia without lower urinary tract symptoms - Plan: CBC with Differential/Platelet, Lipid panel, PSA  Aortic atherosclerosis (HCC) - Plan: CBC with Differential/Platelet, Lipid panel  Hyperlipidemia, unspecified hyperlipidemia type - Plan: CBC with Differential/Platelet, Lipid panel, fenofibrate 160 MG tablet, icosapent Ethyl (VASCEPA) 1 g capsule, rosuvastatin (CRESTOR) 5 MG tablet  Hypertriglyceridemia - Plan: CBC with Differential/Platelet, Lipid panel, fenofibrate 160 MG tablet, icosapent Ethyl (VASCEPA) 1 g capsule  History of herpes genitalis - Plan: CBC with Differential/Platelet,  Lipid panel, valACYclovir (VALTREX) 1000 MG tablet  Transient insomnia of non-organic origin - Plan: CBC with Differential/Platelet, Lipid panel, zolpidem (AMBIEN) 10 MG tablet  Erectile dysfunction due to arterial insufficiency - Plan: CBC with Differential/Platelet, Lipid panel, tadalafil (CIALIS) 20 MG tablet  Immunization History  Administered Date(s) Administered   Influenza Split 12/11/2000, 03/22/2010, 10/28/2021   Influenza-Unspecified 12/06/2015, 11/15/2016, 11/23/2017, 12/17/2018, 11/30/2020   PFIZER Comirnaty(Gray Top)Covid-19 Tri-Sucrose Vaccine 10/05/2020   PFIZER(Purple Top)SARS-COV-2 Vaccination 02/17/2019, 03/10/2019, 12/02/2019   PPD Test 07/31/1994   Pfizer Covid-19 Vaccine Bivalent Booster 26yrs & up 12/09/2020   Pneumococcal Conjugate-13 06/03/2014   Pneumococcal Polysaccharide-23 07/31/1994, 11/24/2004   Tdap 03/22/2010, 05/11/2021   Vaccinia,smallpox Monkeypox Vaccine Live,pf 11/05/2020, 12/26/2020   Zoster Recombinant(Shingrix) 05/11/2021, 07/19/2021    Health Maintenance  Topic Date Due   OPHTHALMOLOGY EXAM  Never done   HEMOGLOBIN A1C  04/22/2022   Diabetic kidney evaluation - Urine ACR  10/21/2022   FOOT EXAM  10/21/2022   INFLUENZA VACCINE  05/28/2023 (Originally 09/28/2022)  Diabetic kidney evaluation - eGFR measurement  08/18/2023   Colonoscopy  02/08/2025   DTaP/Tdap/Td (3 - Td or Tdap) 05/12/2031   Hepatitis C Screening  Completed   HIV Screening  Completed   Zoster Vaccines- Shingrix  Completed   HPV VACCINES  Aged Out   COVID-19 Vaccine  Discontinued    Discussed health benefits of physical activity, and encouraged him to engage in regular exercise appropriate for his age and condition.  Problem List Items Addressed This Visit     History of renal stone (Chronic)   Relevant Orders   CBC with Differential/Platelet   Lipid panel   Aortic atherosclerosis (HCC)   Relevant Medications   fenofibrate 160 MG tablet   icosapent Ethyl (VASCEPA) 1  g capsule   rosuvastatin (CRESTOR) 5 MG tablet   tadalafil (CIALIS) 20 MG tablet   Other Relevant Orders   CBC with Differential/Platelet   Lipid panel   Benign prostatic hyperplasia without lower urinary tract symptoms   Relevant Orders   CBC with Differential/Platelet   Lipid panel   PSA   Essential hypertension   Relevant Medications   fenofibrate 160 MG tablet   icosapent Ethyl (VASCEPA) 1 g capsule   rosuvastatin (CRESTOR) 5 MG tablet   tadalafil (CIALIS) 20 MG tablet   Other Relevant Orders   CBC with Differential/Platelet   Comprehensive metabolic panel   Lipid panel   History of esophageal stricture   Relevant Medications   pantoprazole (PROTONIX) 40 MG tablet   Other Relevant Orders   CBC with Differential/Platelet   Lipid panel   History of herpes genitalis   Relevant Medications   valACYclovir (VALTREX) 1000 MG tablet   Other Relevant Orders   CBC with Differential/Platelet   Lipid panel   HIV disease (HCC)   Relevant Medications   bictegravir-emtricitabine-tenofovir AF (BIKTARVY) 50-200-25 MG TABS tablet   valACYclovir (VALTREX) 1000 MG tablet   Other Relevant Orders   CBC with Differential/Platelet   Comprehensive metabolic panel   Lipid panel   HIV-1 RNA quant-no reflex-bld   T-helper cells (CD4) count (not at [facility])   Hyperlipidemia   Relevant Medications   fenofibrate 160 MG tablet   icosapent Ethyl (VASCEPA) 1 g capsule   rosuvastatin (CRESTOR) 5 MG tablet   tadalafil (CIALIS) 20 MG tablet   Other Relevant Orders   CBC with Differential/Platelet   Lipid panel   CBC with Differential/Platelet   Lipid panel   LBBB (left bundle branch block)   Relevant Medications   fenofibrate 160 MG tablet   icosapent Ethyl (VASCEPA) 1 g capsule   rosuvastatin (CRESTOR) 5 MG tablet   tadalafil (CIALIS) 20 MG tablet   Other Relevant Orders   CBC with Differential/Platelet   Lipid panel   Stage 3a chronic kidney disease (HCC)   Relevant Orders   CBC with  Differential/Platelet   Comprehensive metabolic panel   Lipid panel   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)   Relevant Medications   rosuvastatin (CRESTOR) 5 MG tablet   Other Relevant Orders   CBC with Differential/Platelet   Comprehensive metabolic panel   Lipid panel   Other Visit Diagnoses     Routine general medical examination at a health care facility    -  Primary   Relevant Orders   CBC with Differential/Platelet   Lipid panel   Hypertriglyceridemia       Relevant Medications   fenofibrate 160 MG tablet   icosapent Ethyl (VASCEPA) 1 g capsule  rosuvastatin (CRESTOR) 5 MG tablet   tadalafil (CIALIS) 20 MG tablet   Other Relevant Orders   CBC with Differential/Platelet   Lipid panel   Transient insomnia of non-organic origin       Relevant Medications   zolpidem (AMBIEN) 10 MG tablet   Other Relevant Orders   CBC with Differential/Platelet   Lipid panel   Erectile dysfunction due to arterial insufficiency       Relevant Medications   fenofibrate 160 MG tablet   icosapent Ethyl (VASCEPA) 1 g capsule   rosuvastatin (CRESTOR) 5 MG tablet   tadalafil (CIALIS) 20 MG tablet   Other Relevant Orders   CBC with Differential/Platelet   Lipid panel     Discussed the use of Cialis to help with ED in terms of side effects and proper use regimen.  Also discussed BPH symptoms.  Continue on present medication regimen. Follow-up 6 months    Sharlot Gowda, MD

## 2022-10-27 ENCOUNTER — Other Ambulatory Visit (HOSPITAL_BASED_OUTPATIENT_CLINIC_OR_DEPARTMENT_OTHER): Payer: Self-pay

## 2022-10-27 DIAGNOSIS — M50123 Cervical disc disorder at C6-C7 level with radiculopathy: Secondary | ICD-10-CM | POA: Diagnosis not present

## 2022-10-27 DIAGNOSIS — M5412 Radiculopathy, cervical region: Secondary | ICD-10-CM | POA: Diagnosis not present

## 2022-10-27 LAB — T-HELPER CELLS (CD4) COUNT (NOT AT ARMC)
% CD 4 Pos. Lymph.: 38.2 % (ref 30.8–58.5)
Absolute CD 4 Helper: 1261 /uL (ref 359–1519)
Basophils Absolute: 0 10*3/uL (ref 0.0–0.2)
Basos: 1 %
EOS (ABSOLUTE): 0.1 10*3/uL (ref 0.0–0.4)
Eos: 2 %
Hematocrit: 49.6 % (ref 37.5–51.0)
Hemoglobin: 17.2 g/dL (ref 13.0–17.7)
Immature Grans (Abs): 0 10*3/uL (ref 0.0–0.1)
Immature Granulocytes: 0 %
Lymphocytes Absolute: 3.3 10*3/uL — ABNORMAL HIGH (ref 0.7–3.1)
Lymphs: 43 %
MCH: 34.2 pg — ABNORMAL HIGH (ref 26.6–33.0)
MCHC: 34.7 g/dL (ref 31.5–35.7)
MCV: 99 fL — ABNORMAL HIGH (ref 79–97)
Monocytes Absolute: 0.9 10*3/uL (ref 0.1–0.9)
Monocytes: 12 %
Neutrophils Absolute: 3.2 10*3/uL (ref 1.4–7.0)
Neutrophils: 42 %
Platelets: 227 10*3/uL (ref 150–450)
RBC: 5.03 x10E6/uL (ref 4.14–5.80)
RDW: 12 % (ref 11.6–15.4)
WBC: 7.6 10*3/uL (ref 3.4–10.8)

## 2022-10-27 LAB — COMPREHENSIVE METABOLIC PANEL
ALT: 40 IU/L (ref 0–44)
AST: 19 IU/L (ref 0–40)
Albumin: 4.5 g/dL (ref 3.8–4.9)
Alkaline Phosphatase: 44 IU/L (ref 44–121)
BUN/Creatinine Ratio: 10 (ref 9–20)
BUN: 17 mg/dL (ref 6–24)
Bilirubin Total: 1.3 mg/dL — ABNORMAL HIGH (ref 0.0–1.2)
CO2: 22 mmol/L (ref 20–29)
Calcium: 10.2 mg/dL (ref 8.7–10.2)
Chloride: 99 mmol/L (ref 96–106)
Creatinine, Ser: 1.72 mg/dL — ABNORMAL HIGH (ref 0.76–1.27)
Globulin, Total: 2.6 g/dL (ref 1.5–4.5)
Glucose: 115 mg/dL — ABNORMAL HIGH (ref 70–99)
Potassium: 4 mmol/L (ref 3.5–5.2)
Sodium: 136 mmol/L (ref 134–144)
Total Protein: 7.1 g/dL (ref 6.0–8.5)
eGFR: 46 mL/min/{1.73_m2} — ABNORMAL LOW (ref >59)

## 2022-10-27 LAB — LIPID PANEL
Chol/HDL Ratio: 3.7 ratio (ref 0.0–5.0)
Cholesterol, Total: 137 mg/dL (ref 100–199)
HDL: 37 mg/dL — ABNORMAL LOW (ref >39)
LDL Chol Calc (NIH): 73 mg/dL (ref 0–99)
Triglycerides: 155 mg/dL — ABNORMAL HIGH (ref 0–149)
VLDL Cholesterol Cal: 27 mg/dL (ref 5–40)

## 2022-10-27 LAB — PSA: Prostate Specific Ag, Serum: 1.3 ng/mL (ref 0.0–4.0)

## 2022-10-27 LAB — HIV-1 RNA QUANT-NO REFLEX-BLD: HIV-1 RNA Viral Load: <20 {copies}/mL

## 2022-10-27 MED ORDER — CYCLOBENZAPRINE HCL 10 MG PO TABS
10.0000 mg | ORAL_TABLET | Freq: Three times a day (TID) | ORAL | 0 refills | Status: DC | PRN
  Filled 2022-10-27: qty 50, 17d supply, fill #0

## 2022-10-27 MED ORDER — OXYCODONE-ACETAMINOPHEN 10-325 MG PO TABS
1.0000 | ORAL_TABLET | Freq: Four times a day (QID) | ORAL | 0 refills | Status: DC | PRN
  Filled 2022-10-27: qty 28, 7d supply, fill #0

## 2022-11-03 ENCOUNTER — Other Ambulatory Visit: Payer: Self-pay

## 2022-11-03 ENCOUNTER — Other Ambulatory Visit (HOSPITAL_COMMUNITY): Payer: Self-pay

## 2022-11-03 ENCOUNTER — Other Ambulatory Visit: Payer: Self-pay | Admitting: Pharmacist

## 2022-11-03 DIAGNOSIS — B20 Human immunodeficiency virus [HIV] disease: Secondary | ICD-10-CM

## 2022-11-03 MED ORDER — BIKTARVY 50-200-25 MG PO TABS
1.0000 | ORAL_TABLET | Freq: Every day | ORAL | 3 refills | Status: DC
Start: 2022-11-03 — End: 2023-02-12
  Filled 2022-11-03: qty 30, 30d supply, fill #0
  Filled 2022-12-12: qty 30, 30d supply, fill #1
  Filled 2023-01-09: qty 30, 30d supply, fill #2

## 2022-11-10 ENCOUNTER — Other Ambulatory Visit (HOSPITAL_COMMUNITY): Payer: Self-pay

## 2022-11-13 ENCOUNTER — Ambulatory Visit: Payer: 59

## 2022-11-13 DIAGNOSIS — Z85828 Personal history of other malignant neoplasm of skin: Secondary | ICD-10-CM | POA: Diagnosis not present

## 2022-11-13 DIAGNOSIS — L814 Other melanin hyperpigmentation: Secondary | ICD-10-CM | POA: Diagnosis not present

## 2022-11-13 DIAGNOSIS — Z08 Encounter for follow-up examination after completed treatment for malignant neoplasm: Secondary | ICD-10-CM | POA: Diagnosis not present

## 2022-11-13 DIAGNOSIS — L821 Other seborrheic keratosis: Secondary | ICD-10-CM | POA: Diagnosis not present

## 2022-11-13 DIAGNOSIS — D225 Melanocytic nevi of trunk: Secondary | ICD-10-CM | POA: Diagnosis not present

## 2022-11-17 ENCOUNTER — Other Ambulatory Visit: Payer: Self-pay

## 2022-11-17 ENCOUNTER — Other Ambulatory Visit (HOSPITAL_COMMUNITY): Payer: Self-pay

## 2022-11-24 ENCOUNTER — Other Ambulatory Visit: Payer: Self-pay

## 2022-11-24 ENCOUNTER — Ambulatory Visit (HOSPITAL_BASED_OUTPATIENT_CLINIC_OR_DEPARTMENT_OTHER): Payer: 59 | Attending: Neurosurgery | Admitting: Physical Therapy

## 2022-11-24 DIAGNOSIS — M6281 Muscle weakness (generalized): Secondary | ICD-10-CM | POA: Diagnosis not present

## 2022-11-24 DIAGNOSIS — M542 Cervicalgia: Secondary | ICD-10-CM | POA: Diagnosis not present

## 2022-11-24 DIAGNOSIS — M62838 Other muscle spasm: Secondary | ICD-10-CM | POA: Diagnosis not present

## 2022-11-24 NOTE — Therapy (Signed)
OUTPATIENT PHYSICAL THERAPY CERVICAL Re-EVALUATION   Patient Name: KAYAN QUIGG MRN: [id number] DOB:[birth year], 57 y.o., male Today's Date: 11/24/2022  END OF SESSION:  PT End of Session - 11/24/22 1237     Visit Number 1    Number of Visits 8    Date for PT Re-Evaluation 01/19/23    PT Start Time 1100    PT Stop Time 1143    PT Time Calculation (min) 43 min    Activity Tolerance Patient tolerated treatment well    Behavior During Therapy [facility] for tasks assessed/performed                Past Medical History:  Diagnosis Date   DVT (deep venous thrombosis) (HCC)    after half marathon    Dyslipidemia    GERD (gastroesophageal reflux disease)    Gilbert's disease    HH (hiatus hernia)    HIV positive (HCC) 89   Hyperlipidemia    Renal stone    Past Surgical History:  Procedure Laterality Date   APPENDECTOMY     COLONOSCOPY     last 10 years + ago 2002 in epic- colon all normal   SKIN BIOPSY Right 03/14/2018   seborrheic keratosis inflamed   UPPER GASTROINTESTINAL ENDOSCOPY     Patient Active Problem List   Diagnosis Date Noted   Benign prostatic hyperplasia without lower urinary tract symptoms 09/25/2022   LBBB (left bundle branch block) 09/25/2022   Essential hypertension 03/24/2021   Aortic atherosclerosis (HCC) 04/23/2019   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) 01/31/2019   Stage 3a chronic kidney disease (HCC) 01/30/2019   History of esophageal stricture 06/15/2015   History of herpes genitalis 12/01/2014   H/O herpes labialis 12/01/2014   Gilbert's disease 06/03/2014   HH (hiatus hernia) 04/15/2012   History of renal stone 04/15/2012   Hyperlipidemia 04/11/2011   HIV disease (HCC) 04/11/2011    PCP: Dr Sharlot Gowda   REFERRING PROVIDER: Dr Sharlot Gowda   REFERRING DIAG: C6-C7 disc replacement   THERAPY DIAG:  Other muscle spasm  Muscle weakness (generalized)  Cervicalgia  Rationale for Evaluation and Treatment:  Rehabilitation  ONSET DATE: DOS 8/29   SUBJECTIVE:                                                                                                                                                                                                         SUBJECTIVE STATEMENT: The patient has a history of left radicular pain and numbness.  He had conservative therapy in July but had  no significant improvement.  The patient had C6 and C7 disc replacement surgery on August 29.  Following the surgery the pain went away.  He is no longer having radicular symptoms.  He is not having any postop pain at this time.  He is feeling weak.  Per patient he is on a 10 pound lifting restriction at this time.  Per MD he wants him to try to isolate left tricep and left pec to strengthen.  The patient does feel like he has continued weakness in those areas. PERTINENT HISTORY:  History of DVT several years ago, Gilbert's disease, HIV  PAIN:  Are you having pain? Yes: NPRS scale: 8-9/10 ache on the left side of the back  Pain location: Left upper trap/ mid back/ chest pain  Pain description: aching  Aggravating factors: hold his arm out  Relieving factors:   PRECAUTIONS: None  WEIGHT BEARING RESTRICTIONS: No  FALLS:  Has patient fallen in last 6 months?   LIVING ENVIRONMENT: OCCUPATION:  Metal health counselor   Recreation:  Work out regularly; reading     PLOF: Independent  PATIENT GOALS:  Decr pain  OBJECTIVE:   DIAGNOSTIC FINDINGS:  Cardiac CT showed mild degeneration in the thoracic spine  PATIENT SURVEYS:  FOTO    COGNITION: Overall cognitive status: Within functional limits for tasks assessed  SENSATION: WFL  POSTURE: No Significant postural limitations  PALPATION: With significant reduction in tenderness to palpation compared to prior episode of care CERVICAL ROM:   Active ROM A/PROM (deg) Re-eval  Flexion Full  Extension Full  Right lateral flexion Within normal limits   Left lateral flexion Painful  Right rotation  75  Left rotation 75   (Blank rows = not tested)  UPPER EXTREMITY ROM:  Active ROM Right Re-eval Left Re-eval  Shoulder flexion Full Full  Shoulder extension    Shoulder abduction    Shoulder adduction    Shoulder extension    Shoulder internal rotation Full Full  Shoulder external rotation Full Full  Elbow flexion    Elbow extension    Wrist flexion    Wrist extension    Wrist ulnar deviation    Wrist radial deviation    Wrist pronation    Wrist supination     (Blank rows = not tested)  UPPER EXTREMITY MMT:  MMT Right eval Left eval Right  9/27 Left  9/27  Shoulder flexion 9.9 8.5    Shoulder extension      Shoulder abduction      Shoulder adduction      Shoulder extension      Shoulder internal rotation 19.0 11.4    Shoulder external rotation 14.5 14.1    Middle trapezius      Lower trapezius      Elbow flexion      Elbow extension 20.6 11.0 24.2 13.2  Wrist flexion      Wrist extension      Wrist ulnar deviation      Wrist radial deviation      Wrist pronation      Wrist supination      Grip strength 90 60 80 80   (Blank rows = not tested)  CERVICAL SPECIAL TESTS:  Left spurlings into the mid back  FUNCTIONAL TESTS:    TODAY'S TREATMENT:  DATE:  Review gym program: Cable machine single handle: Row 10 pounds 3 x 12 Isolated tricep extension 10 pounds 3 x 12 Isolated chest press 3 x 12 10 pounds Will perform a left arm only per MD recommendations  Dumbbell tricep extension 3 x 12 5 pounds Dumbbell full arm extension 3 x 12 5 pounds RPE of 5-6 with each exercise    8/6 Trigger Point Dry-Needling  Treatment instructions: Expect mild to moderate muscle soreness. S/S of pneumothorax if dry needled over a lung field, and to seek immediate medical attention should they occur.  Patient verbalized understanding of these instructions and education.  Patient Consent Given: Yes Education handout provided: Yes Muscles treated: 3 spots in left upper trap .30x50 needle 2 spots in C6 and C7 left paraspinal  Electrical stimulation performed: No Parameters: N/A Treatment response/outcome: great twitch with all needles; could see it switching down into the bicep.   Manual: skilled palpation of trigger points; manual traction; manual upper trap stretch, sub-occipital release; patient had to be in a significant flexion poistion to not have pain both in supine and prone  Ube x3 min. No increase in pain if he kept his head flexed.    Cable : row 2x20 15 lbs  Shoulder extension 2x20 15lbs  7/30 Trigger Point Dry-Needling  Treatment instructions: Expect mild to moderate muscle soreness. S/S of pneumothorax if dry needled over a lung field, and to seek immediate medical attention should they occur. Patient verbalized understanding of these instructions and education.  Patient Consent Given: Yes Education handout provided: Yes Muscles treated: 3 spots in left upper trap .30x50 needle  Electrical stimulation performed: No Parameters: N/A Treatment response/outcome: great twitch with all needles   Manual: skilled palpation of trigger points; manual traction; manual upper trap stretch, sub-occipital release; patient had to be in a significant flexion poistion to not have pain both in supine and prone  Ube x3 min. No increase in pain if he kept his head flexed.     Treatment                            7/25:  Trigger Point Dry Needling, Manual Therapy Treatment:  Initial or subsequent education regarding Trigger Point Dry Needling: Subsequent Did patient give consent to treatment with Trigger Point Dry Needling: Yes TPDN with skilled palpation and monitoring followed by STM to the following muscles: bil upper trap, left infraspinatus, Lt C4, C5 paraspinals Supine cervical  distraction and STM- negative impingement testing Prone rib mobs with towel roll under Lt shoulder  Pec stretch with strap Half foam roll- scissors,back stroke, horiz abd elbows ext and flexed Review bench press and triceps kick.    7/10 Trigger Point Dry-Needling  Treatment instructions: Expect mild to moderate muscle soreness. S/S of pneumothorax if dry needled over a lung field, and to seek immediate medical attention should they occur. Patient verbalized understanding of these instructions and education.  Patient Consent Given: Yes Education handout provided: Yes Muscles treated: 3 spots in left upper trap .30x50 needle  Electrical stimulation performed: No Parameters: N/A Treatment response/outcome: great twitch with all needles   Manual: skilled palpation of trigger points; manual traction; manual upper trap stretch, sub-occipital release   Standing flexion 2x10 3lbs  Standing scaption 2x10 3lbs   Row 40 lbs with cuing to keep shoulders down. Triceps kick back 25 lbs right 10 lbs    Reviewed posture with exercises. Patient advise to hold  on thoracic extension because lying with slight extension caused pain.   Eval  Access Code: [email or web address] URL: [email or web address] Date: 08/23/2022 Prepared by: Lorayne Bender  Exercises - Theracane Over Shoulder  - 1 x daily - 7 x weekly - 3 sets - 10 reps - Seated Mid Back Stretch  - 1 x daily - 7 x weekly - 3 sets - 3 reps - 15-20 sec  hold - Gentle Levator Scapulae Stretch  - 1 x daily - 7 x weekly - 3 sets - 3 reps - 20  hold - Seated Upper Trapezius Stretch  - 1 x daily - 7 x weekly - 3 sets - 3 reps - 20 sec  hold - Thoracic Extension Mobilization on Foam Roll  - 1 x daily - 7 x weekly - 1 sets - 10 reps - Doorway Pec Stretch at 90 Degrees Abduction  - 1 x daily - 7 x weekly - 3 sets - 10 reps  PATIENT EDUCATION:  Education details: HEP, symptom mangement  Person educated: Patient Education method: Explanation,  Demonstration, Tactile cues, Verbal cues, and Handouts Education comprehension: verbalized understanding, returned demonstration, verbal cues required, tactile cues required, and needs further education  HOME EXERCISE PROGRAM: Access Code: [email or web address] URL: [email or web address]   ASSESSMENT:  CLINICAL IMPRESSION: The patient presents with significant improvement following his procedure.  He is no longer having pain or radicular symptoms into his arm.  He does continue to have residual weakness in triceps and shoulder movement on the left side.  His grip strength is improved significantly from prior episode.  Per patient he is on a 10 pound lifting restriction at this time.  He would like to develop a gym program to improve strength while staying within his restrictions.  We reviewed base exercises that he can continue at the gym.  He will follow-up in 2 to 3 weeks and we reviewed we will retest his strength.  At this time we will reassess his program and see if changes need to be made   Eval:Patient is a 57 year old male who presents with left upper trap pain and tightness as well as rib cage pain that goes into his anterior chest.  He has tenderness to palpation in his mid thoracic area.  The patient radiating pain along his rib cage could suggest some type of thoracic disc issue although we cannot reproduce with thoracic motion.  He has mild limitations in cervical rotation.  He has reproduction of back and pain radiating into his shoulder blade with cervical left sidebending.  He has some tightness in his upper trap and levator musculature but no significant trigger points at this time.  He does report that when he is hurting it is tender to palpation in that area.  He has increased pain when he has to hold his left arm out for things like driving.  He would benefit from skilled therapy including trigger point dry needling in order to decrease muscle spasming, improve functional endurance  of his left arm, and decreased pain in his posterior shoulder, cervical spine area, and rib cage/chest area. OBJECTIVE IMPAIRMENTS: decreased activity tolerance, decreased endurance, decreased strength, increased muscle spasms, and pain.   ACTIVITY LIMITATIONS: carrying, lifting, and reach over head  PARTICIPATION LIMITATIONS: driving and community activity  PERSONAL FACTORS: None are also affecting patient's functional outcome.   REHAB POTENTIAL: Excellent  CLINICAL DECISION MAKING: Stable/uncomplicated  EVALUATION COMPLEXITY: Low   GOALS: Goals reviewed with patient? Yes  SHORT TERM GOALS: Target date: 10/05/2022  Patient will increase elbow extension strength on the right by 5 pounds Baseline:  Goal status: INITIAL  2.  Patient will increase demonstrate full active motion of left shoulder Baseline:  Goal status: INITIAL  3.  Patient will be independent with basic program Baseline:  Goal status: INITIAL  LONG TERM GOALS: Target date: 10/05/2022    Patient will return to work activities without pain. Baseline:  Goal status: INITIAL  2.  Patient will perform full gym program without pain when released by MD Baseline:  Goal status: INITIAL   PLAN:  PT FREQUENCY: 1x/week  PT DURATION: 8 weeks  PLANNED INTERVENTIONS: Therapeutic exercises, Therapeutic activity, Neuromuscular re-education, Balance training, Gait training, Patient/Family education, Self Care, Joint mobilization, Aquatic Therapy, Dry Needling, Electrical stimulation, Cryotherapy, Moist heat, and Manual therapy  PLAN FOR NEXT SESSION: Review gym program progress weights when cleared by MD.  Next MD visit is October 23.  Consider supine shoulder series including ABCs.   Lorayne Bender PT DPT 11/24/22 12:43 PM

## 2022-12-12 ENCOUNTER — Other Ambulatory Visit: Payer: Self-pay

## 2022-12-12 NOTE — Progress Notes (Signed)
Specialty Pharmacy Refill Coordination Note  Darryl Black is a 57 y.o. male contacted today regarding refills of specialty medication(s) Bictegravir-Emtricitab-Tenofov   Patient requested Delivery   Delivery date: 12/15/22   Verified address: [address] [place] [address]   Medication will be filled on 12/14/22.

## 2022-12-14 ENCOUNTER — Other Ambulatory Visit: Payer: Self-pay

## 2022-12-20 ENCOUNTER — Ambulatory Visit (HOSPITAL_BASED_OUTPATIENT_CLINIC_OR_DEPARTMENT_OTHER): Payer: 59 | Attending: Neurosurgery | Admitting: Physical Therapy

## 2022-12-20 ENCOUNTER — Encounter (HOSPITAL_BASED_OUTPATIENT_CLINIC_OR_DEPARTMENT_OTHER): Payer: Self-pay | Admitting: Physical Therapy

## 2022-12-20 DIAGNOSIS — M542 Cervicalgia: Secondary | ICD-10-CM | POA: Diagnosis not present

## 2022-12-20 DIAGNOSIS — M6281 Muscle weakness (generalized): Secondary | ICD-10-CM | POA: Diagnosis not present

## 2022-12-20 DIAGNOSIS — M546 Pain in thoracic spine: Secondary | ICD-10-CM | POA: Diagnosis not present

## 2022-12-20 DIAGNOSIS — M62838 Other muscle spasm: Secondary | ICD-10-CM | POA: Insufficient documentation

## 2022-12-20 NOTE — Therapy (Signed)
OUTPATIENT PHYSICAL THERAPY CERVICAL Re-EVALUATION   Patient Name: Darryl Black MRN: [id number] DOB:20-Mar-1965, 57 y.o., male Today's Date: 12/21/2022  END OF SESSION:  PT End of Session - 12/21/22 1022     Visit Number 2    Number of Visits 8    Date for PT Re-Evaluation 01/19/23    PT Start Time 1515    PT Stop Time 1545   did not require a full session   PT Time Calculation (min) 30 min    Activity Tolerance Patient tolerated treatment well    Behavior During Therapy [facility] for tasks assessed/performed                 Past Medical History:  Diagnosis Date   DVT (deep venous thrombosis) (HCC)    after half marathon    Dyslipidemia    GERD (gastroesophageal reflux disease)    Gilbert's disease    HH (hiatus hernia)    HIV positive (HCC) 89   Hyperlipidemia    Renal stone    Past Surgical History:  Procedure Laterality Date   APPENDECTOMY     COLONOSCOPY     last 10 years + ago 2002 in epic- colon all normal   SKIN BIOPSY Right 03/14/2018   seborrheic keratosis inflamed   UPPER GASTROINTESTINAL ENDOSCOPY     Patient Active Problem List   Diagnosis Date Noted   Benign prostatic hyperplasia without lower urinary tract symptoms 09/25/2022   LBBB (left bundle branch block) 09/25/2022   Essential hypertension 03/24/2021   Aortic atherosclerosis (HCC) 04/23/2019   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) 01/31/2019   Stage 3a chronic kidney disease (HCC) 01/30/2019   History of esophageal stricture 06/15/2015   History of herpes genitalis 12/01/2014   H/O herpes labialis 12/01/2014   Gilbert's disease 06/03/2014   HH (hiatus hernia) 04/15/2012   History of renal stone 04/15/2012   Hyperlipidemia 04/11/2011   HIV disease (HCC) 04/11/2011    PCP: Dr Sharlot Gowda   REFERRING PROVIDER: Dr Sharlot Gowda   REFERRING DIAG: C6-C7 disc replacement   THERAPY DIAG:  Other muscle spasm  Muscle weakness (generalized)  Pain in thoracic  spine  Cervicalgia  Rationale for Evaluation and Treatment: Rehabilitation  ONSET DATE: DOS 8/29   SUBJECTIVE:                                                                                                                                                                                                         SUBJECTIVE STATEMENT: The patient returns after working on his  tricpep and pec for about a month. He has had no issues. We will measure his strength today.   Evals: The patient has a history of left radicular pain and numbness.  He had conservative therapy in July but had no significant improvement.  The patient had C6 and C7 disc replacement surgery on August 29.  Following the surgery the pain went away.  He is no longer having radicular symptoms.  He is not having any postop pain at this time.  He is feeling weak.  Per patient he is on a 10 pound lifting restriction at this time.  Per MD he wants him to try to isolate left tricep and left pec to strengthen.  The patient does feel like he has continued weakness in those areas. PERTINENT HISTORY:  History of DVT several years ago, Gilbert's disease, HIV  PAIN:  Are you having pain? Yes: NPRS scale: 8-9/10 ache on the left side of the back  Pain location: Left upper trap/ mid back/ chest pain  Pain description: aching  Aggravating factors: hold his arm out  Relieving factors:   PRECAUTIONS: None  WEIGHT BEARING RESTRICTIONS: No  FALLS:  Has patient fallen in last 6 months?   LIVING ENVIRONMENT: OCCUPATION:  Metal health counselor   Recreation:  Work out regularly; reading     PLOF: Independent  PATIENT GOALS:  Decr pain  OBJECTIVE:   DIAGNOSTIC FINDINGS:  Cardiac CT showed mild degeneration in the thoracic spine  PATIENT SURVEYS:  FOTO    COGNITION: Overall cognitive status: Within functional limits for tasks assessed  SENSATION: WFL  POSTURE: No Significant postural limitations  PALPATION: With  significant reduction in tenderness to palpation compared to prior episode of care CERVICAL ROM:   Active ROM A/PROM (deg) Re-eval  Flexion Full  Extension Full  Right lateral flexion Within normal limits  Left lateral flexion Painful  Right rotation  75  Left rotation 75   (Blank rows = not tested)  UPPER EXTREMITY ROM:  Active ROM Right Re-eval Left Re-eval  Shoulder flexion Full Full  Shoulder extension    Shoulder abduction    Shoulder adduction    Shoulder extension    Shoulder internal rotation Full Full  Shoulder external rotation Full Full  Elbow flexion    Elbow extension    Wrist flexion    Wrist extension    Wrist ulnar deviation    Wrist radial deviation    Wrist pronation    Wrist supination     (Blank rows = not tested)  UPPER EXTREMITY MMT:  MMT Right eval Left eval Right  9/27 Left  9/27 Right 10/24  Left 10/24  Shoulder flexion 9.9 8.5   14.1 15.3  Shoulder extension        Shoulder abduction        Shoulder adduction        Shoulder extension        Shoulder internal rotation 19.0 11.4   32.4 22.1  Shoulder external rotation 14.5 14.1   23.5 21.8  Middle trapezius        Lower trapezius        Elbow flexion        Elbow extension 20.6 11.0 24.2 13.2 37.5 22.4  Wrist flexion        Wrist extension        Wrist ulnar deviation        Wrist radial deviation        Wrist pronation  Wrist supination        Grip strength 90 60 80 80     (Blank rows = not tested)  CERVICAL SPECIAL TESTS:  Left spurlings into the mid back  FUNCTIONAL TESTS:    TODAY'S TREATMENT:                                                                                                                              DATE:  Review gym program: Cable machine single handle: Row 10 pounds 3 x 12 Isolated tricep extension 10 pounds 3 x 12 Isolated chest press 3 x 12 10 pounds Will perform a left arm only per MD recommendations  Dumbbell tricep extension 3 x  12 5 pounds Dumbbell full arm extension 3 x 12 5 pounds RPE of 5-6 with each exercise    8/6 Trigger Point Dry-Needling  Treatment instructions: Expect mild to moderate muscle soreness. S/S of pneumothorax if dry needled over a lung field, and to seek immediate medical attention should they occur. Patient verbalized understanding of these instructions and education.  Patient Consent Given: Yes Education handout provided: Yes Muscles treated: 3 spots in left upper trap .30x50 needle 2 spots in C6 and C7 left paraspinal  Electrical stimulation performed: No Parameters: N/A Treatment response/outcome: great twitch with all needles; could see it switching down into the bicep.   Manual: skilled palpation of trigger points; manual traction; manual upper trap stretch, sub-occipital release; patient had to be in a significant flexion poistion to not have pain both in supine and prone  Ube x3 min. No increase in pain if he kept his head flexed.    Cable : row 2x20 15 lbs  Shoulder extension 2x20 15lbs  7/30 Trigger Point Dry-Needling  Treatment instructions: Expect mild to moderate muscle soreness. S/S of pneumothorax if dry needled over a lung field, and to seek immediate medical attention should they occur. Patient verbalized understanding of these instructions and education.  Patient Consent Given: Yes Education handout provided: Yes Muscles treated: 3 spots in left upper trap .30x50 needle  Electrical stimulation performed: No Parameters: N/A Treatment response/outcome: great twitch with all needles   Manual: skilled palpation of trigger points; manual traction; manual upper trap stretch, sub-occipital release; patient had to be in a significant flexion poistion to not have pain both in supine and prone  Ube x3 min. No increase in pain if he kept his head flexed.     Treatment                            7/25:  Trigger Point Dry Needling, Manual Therapy Treatment:  Initial or  subsequent education regarding Trigger Point Dry Needling: Subsequent Did patient give consent to treatment with Trigger Point Dry Needling: Yes TPDN with skilled palpation and monitoring followed by STM to the following muscles: bil upper trap, left infraspinatus, Lt C4, C5 paraspinals Supine cervical distraction and  STM- negative impingement testing Prone rib mobs with towel roll under Lt shoulder  Pec stretch with strap Half foam roll- scissors,back stroke, horiz abd elbows ext and flexed Review bench press and triceps kick.    7/10 Trigger Point Dry-Needling  Treatment instructions: Expect mild to moderate muscle soreness. S/S of pneumothorax if dry needled over a lung field, and to seek immediate medical attention should they occur. Patient verbalized understanding of these instructions and education.  Patient Consent Given: Yes Education handout provided: Yes Muscles treated: 3 spots in left upper trap .30x50 needle  Electrical stimulation performed: No Parameters: N/A Treatment response/outcome: great twitch with all needles   Manual: skilled palpation of trigger points; manual traction; manual upper trap stretch, sub-occipital release   Standing flexion 2x10 3lbs  Standing scaption 2x10 3lbs   Row 40 lbs with cuing to keep shoulders down. Triceps kick back 25 lbs right 10 lbs    Reviewed posture with exercises. Patient advise to hold on thoracic extension because lying with slight extension caused pain.   Eval  Access Code: [email or web address] URL: [email or web address] Date: 08/23/2022 Prepared by: Lorayne Bender  Exercises - Theracane Over Shoulder  - 1 x daily - 7 x weekly - 3 sets - 10 reps - Seated Mid Back Stretch  - 1 x daily - 7 x weekly - 3 sets - 3 reps - 15-20 sec  hold - Gentle Levator Scapulae Stretch  - 1 x daily - 7 x weekly - 3 sets - 3 reps - 20  hold - Seated Upper Trapezius Stretch  - 1 x daily - 7 x weekly - 3 sets - 3 reps - 20 sec  hold -  Thoracic Extension Mobilization on Foam Roll  - 1 x daily - 7 x weekly - 1 sets - 10 reps - Doorway Pec Stretch at 90 Degrees Abduction  - 1 x daily - 7 x weekly - 3 sets - 10 reps  PATIENT EDUCATION:  Education details: HEP, symptom mangement  Person educated: Patient Education method: Explanation, Demonstration, Tactile cues, Verbal cues, and Handouts Education comprehension: verbalized understanding, returned demonstration, verbal cues required, tactile cues required, and needs further education  HOME EXERCISE PROGRAM: Access Code: [email or web address] URL: [email or web address]   ASSESSMENT:  CLINICAL IMPRESSION: The patients strength numbers have improved significantly. He still has a deficit from side to aide but that is mostly because his right side has improved significantly as well. We reviewed his exercises and talked to him about RPE. At this time he will not likely require further therapy. He has an exercise program that is progressing him. He understands how to progress. We will see him PRN to work on manual therapy if he has any issues or to do measurements to see progress.   Eval:Patient is a 57 year old male who presents with left upper trap pain and tightness as well as rib cage pain that goes into his anterior chest.  He has tenderness to palpation in his mid thoracic area.  The patient radiating pain along his rib cage could suggest some type of thoracic disc issue although we cannot reproduce with thoracic motion.  He has mild limitations in cervical rotation.  He has reproduction of back and pain radiating into his shoulder blade with cervical left sidebending.  He has some tightness in his upper trap and levator musculature but no significant trigger points at this time.  He does report that when he is hurting it is tender to palpation in that area.  He  has increased pain when he has to hold his left arm out for things like driving.  He would benefit from skilled therapy  including trigger point dry needling in order to decrease muscle spasming, improve functional endurance of his left arm, and decreased pain in his posterior shoulder, cervical spine area, and rib cage/chest area. OBJECTIVE IMPAIRMENTS: decreased activity tolerance, decreased endurance, decreased strength, increased muscle spasms, and pain.   ACTIVITY LIMITATIONS: carrying, lifting, and reach over head  PARTICIPATION LIMITATIONS: driving and community activity  PERSONAL FACTORS: None are also affecting patient's functional outcome.   REHAB POTENTIAL: Excellent  CLINICAL DECISION MAKING: Stable/uncomplicated  EVALUATION COMPLEXITY: Low   GOALS: Goals reviewed with patient? Yes  SHORT TERM GOALS: Target date: 10/05/2022    Patient will increase elbow extension strength on the right by 5 pounds Baseline:  Goal status: INITIAL  2.  Patient will increase demonstrate full active motion of left shoulder Baseline:  Goal status: INITIAL  3.  Patient will be independent with basic program Baseline:  Goal status: INITIAL  LONG TERM GOALS: Target date: 10/05/2022    Patient will return to work activities without pain. Baseline:  Goal status: INITIAL  2.  Patient will perform full gym program without pain when released by MD Baseline:  Goal status: INITIAL   PLAN:  PT FREQUENCY: 1x/week  PT DURATION: 8 weeks  PLANNED INTERVENTIONS: Therapeutic exercises, Therapeutic activity, Neuromuscular re-education, Balance training, Gait training, Patient/Family education, Self Care, Joint mobilization, Aquatic Therapy, Dry Needling, Electrical stimulation, Cryotherapy, Moist heat, and Manual therapy  PLAN FOR NEXT SESSION: Review gym program progress weights when cleared by MD.  Next MD visit is October 23.  Consider supine shoulder series including ABCs.   Lorayne Bender PT DPT 12/21/22 11:34 AM

## 2023-01-01 ENCOUNTER — Encounter: Payer: Self-pay | Admitting: Family Medicine

## 2023-01-05 ENCOUNTER — Other Ambulatory Visit: Payer: Self-pay

## 2023-01-09 ENCOUNTER — Other Ambulatory Visit (HOSPITAL_COMMUNITY): Payer: Self-pay

## 2023-01-09 ENCOUNTER — Other Ambulatory Visit: Payer: Self-pay

## 2023-01-09 NOTE — Progress Notes (Signed)
Specialty Pharmacy Refill Coordination Note  Darryl Black is a 57 y.o. male contacted today regarding refills of specialty medication(s) Bictegravir-Emtricitab-Tenofov   Patient requested Delivery   Delivery date: 01/10/23   Verified address: [address] [place] [address]   Medication will be filled on 01/09/23.

## 2023-01-09 NOTE — Progress Notes (Signed)
Specialty Pharmacy Ongoing Clinical Assessment Note  Darryl Black is a 58 y.o. male who is being followed by the specialty pharmacy service for RxSp HIV   Patient's specialty medication(s) reviewed today: Bictegravir-Emtricitab-Tenofov   Missed doses in the last 4 weeks: 1   Patient/Caregiver did not have any additional questions or concerns.   Therapeutic benefit summary: Patient is achieving benefit   Adverse events/side effects summary: No adverse events/side effects   Patient's therapy is appropriate to: Continue    Goals Addressed             This Visit's Progress    Achieve Undetectable HIV Viral Load < 20       Patient is on track. Patient will maintain adherence.  Patient continues to have an undetectable viral load.          Follow up:  6 months  Servando Snare Specialty Pharmacist

## 2023-01-17 ENCOUNTER — Other Ambulatory Visit (HOSPITAL_BASED_OUTPATIENT_CLINIC_OR_DEPARTMENT_OTHER): Payer: Self-pay

## 2023-01-24 ENCOUNTER — Other Ambulatory Visit (HOSPITAL_BASED_OUTPATIENT_CLINIC_OR_DEPARTMENT_OTHER): Payer: Self-pay

## 2023-02-09 ENCOUNTER — Other Ambulatory Visit (HOSPITAL_COMMUNITY): Payer: Self-pay

## 2023-02-09 ENCOUNTER — Other Ambulatory Visit: Payer: Self-pay

## 2023-02-09 ENCOUNTER — Other Ambulatory Visit (HOSPITAL_BASED_OUTPATIENT_CLINIC_OR_DEPARTMENT_OTHER): Payer: Self-pay

## 2023-02-12 ENCOUNTER — Encounter: Payer: Self-pay | Admitting: Family Medicine

## 2023-02-12 ENCOUNTER — Other Ambulatory Visit (HOSPITAL_BASED_OUTPATIENT_CLINIC_OR_DEPARTMENT_OTHER): Payer: Self-pay

## 2023-02-12 DIAGNOSIS — B20 Human immunodeficiency virus [HIV] disease: Secondary | ICD-10-CM

## 2023-02-12 MED ORDER — BIKTARVY 50-200-25 MG PO TABS: 1.0000 | ORAL_TABLET | Freq: Every day | ORAL | 3 refills | Status: DC

## 2023-02-13 ENCOUNTER — Other Ambulatory Visit (HOSPITAL_COMMUNITY): Payer: Self-pay

## 2023-02-14 ENCOUNTER — Encounter: Payer: Self-pay | Admitting: Family Medicine

## 2023-02-14 ENCOUNTER — Other Ambulatory Visit: Payer: Self-pay

## 2023-02-14 NOTE — Progress Notes (Signed)
Disenrolled patient from Transport planner. Patient has new insurance and has transferred to another pharmacy.

## 2023-02-14 NOTE — Telephone Encounter (Signed)
 Care team updated and letter sent for eye exam notes.

## 2023-03-16 ENCOUNTER — Other Ambulatory Visit (HOSPITAL_COMMUNITY): Payer: Self-pay

## 2023-04-02 ENCOUNTER — Other Ambulatory Visit (HOSPITAL_BASED_OUTPATIENT_CLINIC_OR_DEPARTMENT_OTHER): Payer: Self-pay

## 2023-04-03 ENCOUNTER — Telehealth: Payer: Self-pay

## 2023-04-03 ENCOUNTER — Other Ambulatory Visit (HOSPITAL_COMMUNITY): Payer: Self-pay

## 2023-04-03 NOTE — Telephone Encounter (Signed)
 Pharmacy Patient Advocate Encounter  Insurance verification completed.   The patient is insured through [facility]   Ran test claim for VASCEPA (BRAND ONLY). Currently a quantity of 120 is a 30 day supply and the co-pay is 5.00 .   This test claim was processed through [facility]- copay amounts may vary at other pharmacies due to pharmacy/plan contracts, or as the patient moves through the different stages of their insurance plan.

## 2023-04-18 ENCOUNTER — Other Ambulatory Visit (HOSPITAL_COMMUNITY): Payer: Self-pay

## 2023-04-24 ENCOUNTER — Encounter: Payer: Self-pay | Admitting: Internal Medicine

## 2023-05-24 ENCOUNTER — Other Ambulatory Visit: Payer: PRIVATE HEALTH INSURANCE

## 2023-05-24 DIAGNOSIS — I10 Essential (primary) hypertension: Secondary | ICD-10-CM

## 2023-05-24 DIAGNOSIS — E7849 Other hyperlipidemia: Secondary | ICD-10-CM

## 2023-05-24 DIAGNOSIS — E785 Hyperlipidemia, unspecified: Secondary | ICD-10-CM

## 2023-05-24 DIAGNOSIS — B20 Human immunodeficiency virus [HIV] disease: Secondary | ICD-10-CM

## 2023-05-25 ENCOUNTER — Other Ambulatory Visit: Payer: PRIVATE HEALTH INSURANCE

## 2023-05-25 DIAGNOSIS — B20 Human immunodeficiency virus [HIV] disease: Secondary | ICD-10-CM

## 2023-05-25 LAB — CMP14+EGFR
ALT: 38 IU/L (ref 0–44)
AST: 22 IU/L (ref 0–40)
Albumin: 4.6 g/dL (ref 3.8–4.9)
Alkaline Phosphatase: 45 IU/L (ref 44–121)
BUN/Creatinine Ratio: 11 (ref 9–20)
BUN: 20 mg/dL (ref 6–24)
Bilirubin Total: 1.1 mg/dL (ref 0.0–1.2)
CO2: 21 mmol/L (ref 20–29)
Calcium: 10.2 mg/dL (ref 8.7–10.2)
Chloride: 101 mmol/L (ref 96–106)
Creatinine, Ser: 1.76 mg/dL — ABNORMAL HIGH (ref 0.76–1.27)
Globulin, Total: 2.4 g/dL (ref 1.5–4.5)
Glucose: 87 mg/dL (ref 70–99)
Potassium: 4.7 mmol/L (ref 3.5–5.2)
Sodium: 137 mmol/L (ref 134–144)
Total Protein: 7 g/dL (ref 6.0–8.5)
eGFR: 44 mL/min/{1.73_m2} — ABNORMAL LOW (ref >59)

## 2023-05-25 LAB — CBC WITH DIFFERENTIAL/PLATELET
Basophils Absolute: 0 10*3/uL (ref 0.0–0.2)
Basos: 1 %
EOS (ABSOLUTE): 0.1 10*3/uL (ref 0.0–0.4)
Eos: 1 %
Hematocrit: 47.5 % (ref 37.5–51.0)
Hemoglobin: 16.8 g/dL (ref 13.0–17.7)
Immature Grans (Abs): 0 10*3/uL (ref 0.0–0.1)
Immature Granulocytes: 0 %
Lymphocytes Absolute: 2.2 10*3/uL (ref 0.7–3.1)
Lymphs: 41 %
MCH: 34.7 pg — ABNORMAL HIGH (ref 26.6–33.0)
MCHC: 35.4 g/dL (ref 31.5–35.7)
MCV: 98 fL — ABNORMAL HIGH (ref 79–97)
Monocytes Absolute: 0.6 10*3/uL (ref 0.1–0.9)
Monocytes: 11 %
Neutrophils Absolute: 2.5 10*3/uL (ref 1.4–7.0)
Neutrophils: 46 %
Platelets: 229 10*3/uL (ref 150–450)
RBC: 4.84 x10E6/uL (ref 4.14–5.80)
RDW: 12.4 % (ref 11.6–15.4)
WBC: 5.4 10*3/uL (ref 3.4–10.8)

## 2023-05-25 LAB — LIPID PANEL
Chol/HDL Ratio: 3.4 ratio (ref 0.0–5.0)
Cholesterol, Total: 148 mg/dL (ref 100–199)
HDL: 44 mg/dL (ref >39)
LDL Chol Calc (NIH): 82 mg/dL (ref 0–99)
Triglycerides: 121 mg/dL (ref 0–149)
VLDL Cholesterol Cal: 22 mg/dL (ref 5–40)

## 2023-05-26 LAB — T-HELPER CELLS (CD4) COUNT (NOT AT ARMC)
% CD 4 Pos. Lymph.: 25.7 % — ABNORMAL LOW (ref 30.8–58.5)
Absolute CD 4 Helper: 745 /uL (ref 359–1519)
Basophils Absolute: 0 10*3/uL (ref 0.0–0.2)
Basos: 1 %
EOS (ABSOLUTE): 0.1 10*3/uL (ref 0.0–0.4)
Eos: 1 %
Hematocrit: 48.3 % (ref 37.5–51.0)
Hemoglobin: 16.6 g/dL (ref 13.0–17.7)
Immature Grans (Abs): 0 10*3/uL (ref 0.0–0.1)
Immature Granulocytes: 0 %
Lymphocytes Absolute: 2.9 10*3/uL (ref 0.7–3.1)
Lymphs: 48 %
MCH: 33.8 pg — ABNORMAL HIGH (ref 26.6–33.0)
MCHC: 34.4 g/dL (ref 31.5–35.7)
MCV: 98 fL — ABNORMAL HIGH (ref 79–97)
Monocytes Absolute: 0.7 10*3/uL (ref 0.1–0.9)
Monocytes: 12 %
Neutrophils Absolute: 2.3 10*3/uL (ref 1.4–7.0)
Neutrophils: 38 %
Platelets: 264 10*3/uL (ref 150–450)
RBC: 4.91 x10E6/uL (ref 4.14–5.80)
RDW: 12 % (ref 11.6–15.4)
WBC: 6 10*3/uL (ref 3.4–10.8)

## 2023-05-26 LAB — HIV-1 RNA QUANT-NO REFLEX-BLD: HIV-1 RNA Viral Load: <20 {copies}/mL

## 2023-05-31 ENCOUNTER — Encounter: Payer: Self-pay | Admitting: Family Medicine

## 2023-05-31 ENCOUNTER — Ambulatory Visit: Payer: PRIVATE HEALTH INSURANCE | Admitting: Family Medicine

## 2023-05-31 VITALS — BP 116/80 | HR 74 | Wt 181.6 lb

## 2023-05-31 DIAGNOSIS — Z23 Encounter for immunization: Secondary | ICD-10-CM

## 2023-05-31 DIAGNOSIS — N1831 Chronic kidney disease, stage 3a: Secondary | ICD-10-CM | POA: Diagnosis not present

## 2023-05-31 DIAGNOSIS — B20 Human immunodeficiency virus [HIV] disease: Secondary | ICD-10-CM | POA: Diagnosis not present

## 2023-05-31 DIAGNOSIS — E119 Type 2 diabetes mellitus without complications: Secondary | ICD-10-CM | POA: Diagnosis not present

## 2023-05-31 DIAGNOSIS — Z9889 Other specified postprocedural states: Secondary | ICD-10-CM

## 2023-05-31 LAB — POCT GLYCOSYLATED HEMOGLOBIN (HGB A1C): Hemoglobin A1C: 5.9 % — AB (ref 4.0–5.6)

## 2023-05-31 NOTE — Progress Notes (Unsigned)
   Subjective:    Patient ID: Darryl Black, male    DOB: 09-14-1965, 58 y.o.   MRN: [id number]  HPI He is here for recheck.  He recently had blood work done and is here to further discuss this.  He continues on medications listed in the chart.  He is having no difficulty with these medications.  Blood work continues to show some renal insufficiency but that has been very stable over the last several years.   He also had 1 episode of hemoglobin A1c that was 6.6.  Since that number he has always had numbers below 6. Recently he has had some neck surgery and has some residual motor weakness on the left that he has concerns over.  He notes especially some triceps weakness.  Review of Systems     Objective:    Physical Exam Alert and in no distress otherwise not examined.  His percent CD4 count is down slightly.       Assessment & Plan:  HIV disease (HCC)  Stage 3a chronic kidney disease (HCC)  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) At this point continue on present medications.  We will continue to monitor him from an HIV point of view and see no reason to make any major changes based on his percentage.  If this continues to drop we might need to reevaluate this. Concerning his arm, did recommend he continue with physical therapy and there is also possibility that he might not get any more motor function that he has right now. His kidney function seems to be stable.

## 2023-06-01 LAB — HIV-1 RNA QUANT-NO REFLEX-BLD

## 2023-07-03 ENCOUNTER — Other Ambulatory Visit (HOSPITAL_COMMUNITY): Payer: Self-pay

## 2023-07-03 ENCOUNTER — Telehealth: Payer: Self-pay

## 2023-07-03 NOTE — Telephone Encounter (Signed)
 Pharmacy Patient Advocate Encounter   Received notification from CoverMyMeds that prior authorization for Icosapent  Ethyl 1GM capsulesis required/requested.   Insurance verification completed.   The patient is insured through  [employer] Plan ([employer])  .   Per test claim: PA required; PA submitted to above mentioned insurance via CoverMyMeds Key/confirmation #/EOC  (Key: [id number])      Status is pending

## 2023-07-04 ENCOUNTER — Other Ambulatory Visit (HOSPITAL_COMMUNITY): Payer: Self-pay

## 2023-07-04 NOTE — Telephone Encounter (Signed)
 Pharmacy Patient Advocate Encounter  Received notification from  [employer] ([employer])    that Prior Authorization for Icosapent  Ethyl 1GM capsules has been APPROVED from 5.6.25 to 12.31.2039. Ran test claim, Copay is $RTS, RX WAS FILLED ON 5.7.25. This test claim was processed through [facility]- copay amounts may vary at other pharmacies due to pharmacy/plan contracts, or as the patient moves through the different stages of their insurance plan.   PA #/Case ID/Reference #: (Key: [id number])

## 2023-07-26 ENCOUNTER — Other Ambulatory Visit: Payer: Self-pay | Admitting: Family Medicine

## 2023-07-26 DIAGNOSIS — Z8719 Personal history of other diseases of the digestive system: Secondary | ICD-10-CM

## 2023-08-05 ENCOUNTER — Other Ambulatory Visit: Payer: Self-pay | Admitting: Family Medicine

## 2023-08-05 DIAGNOSIS — F5102 Adjustment insomnia: Secondary | ICD-10-CM

## 2023-08-05 DIAGNOSIS — Z8619 Personal history of other infectious and parasitic diseases: Secondary | ICD-10-CM

## 2023-08-06 ENCOUNTER — Other Ambulatory Visit (HOSPITAL_BASED_OUTPATIENT_CLINIC_OR_DEPARTMENT_OTHER): Payer: Self-pay

## 2023-08-06 MED ORDER — ZOLPIDEM TARTRATE 10 MG PO TABS
5.0000 mg | ORAL_TABLET | Freq: Every evening | ORAL | 0 refills | Status: AC | PRN
  Filled 2023-08-06: qty 30, 30d supply, fill #0

## 2023-08-07 ENCOUNTER — Other Ambulatory Visit: Payer: Self-pay

## 2023-10-11 ENCOUNTER — Other Ambulatory Visit: Payer: Self-pay | Admitting: Family Medicine

## 2023-10-11 DIAGNOSIS — Z8619 Personal history of other infectious and parasitic diseases: Secondary | ICD-10-CM

## 2023-10-21 ENCOUNTER — Other Ambulatory Visit: Payer: Self-pay | Admitting: Family Medicine

## 2023-10-21 DIAGNOSIS — E785 Hyperlipidemia, unspecified: Secondary | ICD-10-CM

## 2023-10-21 DIAGNOSIS — E781 Pure hyperglyceridemia: Secondary | ICD-10-CM

## 2023-10-30 ENCOUNTER — Other Ambulatory Visit: Payer: Self-pay | Admitting: Family Medicine

## 2023-10-30 DIAGNOSIS — I10 Essential (primary) hypertension: Secondary | ICD-10-CM

## 2023-10-30 DIAGNOSIS — E781 Pure hyperglyceridemia: Secondary | ICD-10-CM

## 2023-10-30 DIAGNOSIS — E785 Hyperlipidemia, unspecified: Secondary | ICD-10-CM

## 2023-11-03 ENCOUNTER — Other Ambulatory Visit: Payer: Self-pay | Admitting: Family Medicine

## 2023-11-03 DIAGNOSIS — E785 Hyperlipidemia, unspecified: Secondary | ICD-10-CM

## 2023-11-29 ENCOUNTER — Encounter: Payer: Self-pay | Admitting: Family Medicine

## 2023-11-29 ENCOUNTER — Ambulatory Visit: Payer: PRIVATE HEALTH INSURANCE | Admitting: Family Medicine

## 2023-11-29 VITALS — BP 124/86 | HR 81 | Ht 70.0 in | Wt 184.8 lb

## 2023-11-29 DIAGNOSIS — Z0000 Encounter for general adult medical examination without abnormal findings: Secondary | ICD-10-CM | POA: Diagnosis not present

## 2023-11-29 DIAGNOSIS — B20 Human immunodeficiency virus [HIV] disease: Secondary | ICD-10-CM | POA: Diagnosis not present

## 2023-11-29 DIAGNOSIS — E785 Hyperlipidemia, unspecified: Secondary | ICD-10-CM

## 2023-11-29 DIAGNOSIS — F5102 Adjustment insomnia: Secondary | ICD-10-CM

## 2023-11-29 DIAGNOSIS — N5201 Erectile dysfunction due to arterial insufficiency: Secondary | ICD-10-CM | POA: Diagnosis not present

## 2023-11-29 DIAGNOSIS — R3914 Feeling of incomplete bladder emptying: Secondary | ICD-10-CM

## 2023-11-29 DIAGNOSIS — N401 Enlarged prostate with lower urinary tract symptoms: Secondary | ICD-10-CM

## 2023-11-29 DIAGNOSIS — E11A Type 2 diabetes mellitus without complications in remission: Secondary | ICD-10-CM

## 2023-11-29 LAB — POCT GLYCOSYLATED HEMOGLOBIN (HGB A1C): Hemoglobin A1C: 6.3 % — AB (ref 4.0–5.6)

## 2023-11-29 MED ORDER — TADALAFIL 5 MG PO TABS: 5.0000 mg | ORAL_TABLET | Freq: Every day | ORAL | 11 refills | Status: AC

## 2023-11-29 MED ORDER — ESZOPICLONE 1 MG PO TABS: 1.0000 mg | ORAL_TABLET | Freq: Every evening | ORAL | 1 refills | Status: AC | PRN

## 2023-11-29 NOTE — Progress Notes (Signed)
 Name: Darryl Black   Date of Visit: 11/29/23   Date of last visit with me: Visit date not found   CHIEF COMPLAINT:  Chief Complaint  Patient presents with   Annual Exam    Cpe. Fasting.        HPI:  Discussed the use of AI scribe software for clinical note transcription with the patient, who gave verbal consent to proceed.  History of Present Illness   Darryl Black is a 58 year old male who presents for a follow-up visit.  He had a COVID-19 infection two to three weeks ago and is almost fully recovered. He inquired about the timing for receiving a COVID shot post-infection.  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 Ambien  helps him stay asleep but does not provide restful sleep, as indicated by his sleep tracking device. He is considering alternatives due to these concerns.  He has a history of elevated creatinine levels for years and inquired about the safety of taking creatine supplements.  His A1c was recently found to be slightly elevated at 6.3. He has a family history of diabetes on his father's side and was previously diagnosed with prediabetes. He had managed to lower his A1c from 6.6 to 6.3 through intermittent fasting, although he has not been consistent with it recently.  He reports slower urination and has a known history of an enlarged prostate, confirmed by a CT scan. No increased frequency of urination or incomplete emptying.  He had a disc replacement surgery at C6-C7 a year ago due to spinal stenosis, which caused weakness in his left arm. He continues to experience a lack of strength in his left tricep, which affects his ability to perform certain exercises like bench presses. He has been working with a Systems analyst to address this issue.  He is currently taking several medications including Cialis , Crestor , Protonix , and Biktarvy . He recently had Biktarvy  refilled and is awaiting its delivery.          OBJECTIVE:       11/29/2023    9:10 AM  Depression screen PHQ 2/9  Decreased Interest 0  Down, Depressed, Hopeless 0  PHQ - 2 Score 0     BP Readings from Last 3 Encounters:  11/29/23 124/86  05/31/23 116/80  10/26/22 124/70    BP 124/86   Pulse 81   Ht 5' 10 (1.778 m)   Wt 184 lb 12.8 oz (83.8 kg)   SpO2 97%   BMI 26.52 kg/m    Physical Exam          Physical Exam Constitutional:      Appearance: Normal appearance.  Neurological:     General: No focal deficit present.     Mental Status: He is alert and oriented to person, place, and time. Mental status is at baseline.  Psychiatric:        Mood and Affect: Mood normal.     ASSESSMENT/PLAN:   Assessment & Plan Adjustment insomnia  Erectile dysfunction due to arterial insufficiency  Hyperlipidemia, unspecified hyperlipidemia type  HIV disease (HCC)  Benign prostatic hyperplasia with incomplete bladder emptying  Annual physical exam    Assessment and Plan    Annual Physical - - Full history and exam completed today in addition to [facility] Visit. Reviewed interval concerns, chronic conditions, and preventive care needs. Physical exam performed. Counseling provided on lifestyle, screenings, vaccines, and routine health maintenance. -  Insomnia Intermittent insomnia, difficulty staying asleep,  likely exacerbated by night shift work. Current use of Ambien  may not provide restful sleep. - Prescribe Lunesta, starting at the lowest dose and titrate as needed. - Continue Ambien  as needed, consider switching to Lunesta if more effective.  Prediabetes A1c is 6.3, indicating prediabetes. Family history of diabetes. Previous A1c was 6.6, improved with intermittent fasting. Current weight appropriate, suggesting genetic component. - Resume intermittent fasting to lower A1c. - Recheck A1c in 3 months.  Benign prostatic hyperplasia with lower urinary tract symptoms Symptoms include slower urination  due to enlarged prostate. Previous PSA levels normal. No current urinary frequency issues. - Order PSA test to monitor prostate health. - Consider medication to relax smooth muscles around prostate if symptoms worsen.  Erectile dysfunction Currently using Cialis  10 mg as needed. Discussed benefits of daily low-dose Cialis  for erectile dysfunction and BPH management. - Prescribe Cialis  5 mg for daily use to manage BPH. - Instruct to take additional doses as needed for erectile dysfunction.  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 Current strength imbalance affecting activities like bench press. - Continue progressive overload strength training for triceps.         Terre Zabriskie A. Vita MD [facility]

## 2023-11-29 NOTE — Addendum Note (Signed)
 Addended by: LATTIE CARLO BROCKS on: 11/29/2023 10:39 AM   Modules accepted: Orders

## 2023-11-30 ENCOUNTER — Ambulatory Visit: Payer: Self-pay | Admitting: Family Medicine

## 2023-11-30 LAB — T-HELPER CELLS (CD4) COUNT (NOT AT ARMC)
% CD 4 Pos. Lymph.: 32.4 % (ref 30.8–58.5)
Absolute CD 4 Helper: 778 /uL (ref 359–1519)
Basophils Absolute: 0.1 x10E3/uL (ref 0.0–0.2)
Basos: 1 %
EOS (ABSOLUTE): 0.1 x10E3/uL (ref 0.0–0.4)
Eos: 3 %
Hematocrit: 45.5 % (ref 37.5–51.0)
Hemoglobin: 15.3 g/dL (ref 13.0–17.7)
Immature Grans (Abs): 0 x10E3/uL (ref 0.0–0.1)
Immature Granulocytes: 0 %
Lymphocytes Absolute: 2.4 x10E3/uL (ref 0.7–3.1)
Lymphs: 46 %
MCH: 34.2 pg — ABNORMAL HIGH (ref 26.6–33.0)
MCHC: 33.6 g/dL (ref 31.5–35.7)
MCV: 102 fL — ABNORMAL HIGH (ref 79–97)
Monocytes Absolute: 0.8 x10E3/uL (ref 0.1–0.9)
Monocytes: 15 %
Neutrophils Absolute: 1.8 x10E3/uL (ref 1.4–7.0)
Neutrophils: 35 %
Platelets: 219 x10E3/uL (ref 150–450)
RBC: 4.47 x10E6/uL (ref 4.14–5.80)
RDW: 12.1 % (ref 11.6–15.4)
WBC: 5.2 x10E3/uL (ref 3.4–10.8)

## 2023-11-30 LAB — COMPREHENSIVE METABOLIC PANEL WITH GFR
ALT: 26 IU/L (ref 0–44)
AST: 19 IU/L (ref 0–40)
Albumin: 4.4 g/dL (ref 3.8–4.9)
Alkaline Phosphatase: 49 IU/L (ref 47–123)
BUN/Creatinine Ratio: 10 (ref 9–20)
BUN: 15 mg/dL (ref 6–24)
Bilirubin Total: 0.8 mg/dL (ref 0.0–1.2)
CO2: 22 mmol/L (ref 20–29)
Calcium: 9.9 mg/dL (ref 8.7–10.2)
Chloride: 101 mmol/L (ref 96–106)
Creatinine, Ser: 1.55 mg/dL — ABNORMAL HIGH (ref 0.76–1.27)
Globulin, Total: 2.6 g/dL (ref 1.5–4.5)
Glucose: 129 mg/dL — ABNORMAL HIGH (ref 70–99)
Potassium: 4.7 mmol/L (ref 3.5–5.2)
Sodium: 138 mmol/L (ref 134–144)
Total Protein: 7 g/dL (ref 6.0–8.5)
eGFR: 51 mL/min/1.73 — ABNORMAL LOW (ref >59)

## 2023-11-30 LAB — LIPID PANEL
Chol/HDL Ratio: 4.3 ratio (ref 0.0–5.0)
Cholesterol, Total: 141 mg/dL (ref 100–199)
HDL: 33 mg/dL — ABNORMAL LOW (ref >39)
LDL Chol Calc (NIH): 82 mg/dL (ref 0–99)
Triglycerides: 147 mg/dL (ref 0–149)
VLDL Cholesterol Cal: 26 mg/dL (ref 5–40)

## 2023-11-30 LAB — PSA, TOTAL AND FREE
PSA, Free Pct: 26.1 %
PSA, Free: 0.47 ng/mL
Prostate Specific Ag, Serum: 1.8 ng/mL (ref 0.0–4.0)

## 2023-12-05 ENCOUNTER — Other Ambulatory Visit (HOSPITAL_BASED_OUTPATIENT_CLINIC_OR_DEPARTMENT_OTHER): Payer: Self-pay

## 2023-12-18 ENCOUNTER — Other Ambulatory Visit: Payer: Self-pay | Admitting: Family Medicine

## 2023-12-18 DIAGNOSIS — Z8619 Personal history of other infectious and parasitic diseases: Secondary | ICD-10-CM

## 2024-01-23 ENCOUNTER — Other Ambulatory Visit: Payer: Self-pay | Admitting: Family Medicine

## 2024-01-23 DIAGNOSIS — E781 Pure hyperglyceridemia: Secondary | ICD-10-CM

## 2024-01-23 DIAGNOSIS — E785 Hyperlipidemia, unspecified: Secondary | ICD-10-CM

## 2024-01-30 ENCOUNTER — Other Ambulatory Visit: Payer: Self-pay | Admitting: Family Medicine

## 2024-01-30 DIAGNOSIS — E785 Hyperlipidemia, unspecified: Secondary | ICD-10-CM

## 2024-01-30 DIAGNOSIS — I10 Essential (primary) hypertension: Secondary | ICD-10-CM

## 2024-02-09 ENCOUNTER — Other Ambulatory Visit: Payer: Self-pay | Admitting: Family Medicine

## 2024-02-09 DIAGNOSIS — Z8719 Personal history of other diseases of the digestive system: Secondary | ICD-10-CM

## 2024-02-20 ENCOUNTER — Other Ambulatory Visit: Payer: Self-pay | Admitting: Family Medicine

## 2024-02-20 DIAGNOSIS — Z8619 Personal history of other infectious and parasitic diseases: Secondary | ICD-10-CM

## 2024-02-27 ENCOUNTER — Other Ambulatory Visit: Payer: Self-pay | Admitting: Family Medicine

## 2024-02-27 DIAGNOSIS — B20 Human immunodeficiency virus [HIV] disease: Secondary | ICD-10-CM

## 2024-02-27 NOTE — Telephone Encounter (Signed)
 Last appt. 11/29/23.

## 2024-12-03 ENCOUNTER — Encounter: Payer: PRIVATE HEALTH INSURANCE | Admitting: Family Medicine
